# Patient Record
Sex: Female | Born: 1965
Health system: Southern US, Community
[De-identification: ages and names within clinical notes are randomized; demographics above are authoritative.]

## PROBLEM LIST (undated history)

## (undated) ENCOUNTER — Inpatient Hospital Stay: Admission: EM | Payer: Self-pay | Source: Home / Self Care

## (undated) DIAGNOSIS — M1990 Unspecified osteoarthritis, unspecified site: Secondary | ICD-10-CM

## (undated) DIAGNOSIS — E119 Type 2 diabetes mellitus without complications: Secondary | ICD-10-CM

## (undated) DIAGNOSIS — F32A Depression, unspecified: Secondary | ICD-10-CM

## (undated) DIAGNOSIS — F329 Major depressive disorder, single episode, unspecified: Secondary | ICD-10-CM

## (undated) DIAGNOSIS — I10 Essential (primary) hypertension: Secondary | ICD-10-CM

## (undated) DIAGNOSIS — E041 Nontoxic single thyroid nodule: Secondary | ICD-10-CM

## (undated) DIAGNOSIS — N200 Calculus of kidney: Secondary | ICD-10-CM

## (undated) DIAGNOSIS — F419 Anxiety disorder, unspecified: Secondary | ICD-10-CM

## (undated) DIAGNOSIS — F909 Attention-deficit hyperactivity disorder, unspecified type: Secondary | ICD-10-CM

## (undated) HISTORY — PX: TUBAL LIGATION: SHX77

## (undated) HISTORY — DX: Unspecified osteoarthritis, unspecified site: M19.90

## (undated) HISTORY — PX: ENDOMETRIAL ABLATION: SHX621

## (undated) HISTORY — PX: APPENDECTOMY: SHX54

## (undated) HISTORY — DX: Nontoxic single thyroid nodule: E04.1

## (undated) HISTORY — PX: ECTOPIC PREGNANCY SURGERY: SHX613

## (undated) HISTORY — DX: Anxiety disorder, unspecified: F41.9

## (undated) HISTORY — PX: CHOLECYSTECTOMY: SHX55

---

## 2004-06-05 ENCOUNTER — Ambulatory Visit: Payer: Self-pay | Admitting: Obstetrics and Gynecology

## 2006-01-01 ENCOUNTER — Ambulatory Visit: Payer: Self-pay | Admitting: Family Medicine

## 2006-02-02 ENCOUNTER — Ambulatory Visit: Payer: Self-pay | Admitting: Gastroenterology

## 2006-03-06 ENCOUNTER — Ambulatory Visit: Payer: Self-pay | Admitting: Gastroenterology

## 2006-03-23 ENCOUNTER — Ambulatory Visit: Payer: Self-pay | Admitting: Gastroenterology

## 2007-05-07 ENCOUNTER — Ambulatory Visit: Payer: Self-pay | Admitting: Internal Medicine

## 2007-07-02 ENCOUNTER — Ambulatory Visit: Payer: Self-pay | Admitting: Family Medicine

## 2008-04-07 ENCOUNTER — Ambulatory Visit: Payer: Self-pay | Admitting: Family Medicine

## 2008-11-17 ENCOUNTER — Ambulatory Visit: Payer: Self-pay | Admitting: Family Medicine

## 2009-04-27 ENCOUNTER — Ambulatory Visit: Payer: Self-pay | Admitting: Internal Medicine

## 2012-01-07 ENCOUNTER — Ambulatory Visit: Payer: Self-pay | Admitting: Family Medicine

## 2012-01-07 LAB — CBC WITH DIFFERENTIAL/PLATELET
Basophil #: 0 x10 3/mm 3
Basophil %: 0.3 %
Eosinophil #: 0.4 x10 3/mm 3
Eosinophil %: 2.9 %
HCT: 40.9 %
HGB: 13.7 g/dL
Lymphocyte %: 31.6 %
Lymphs Abs: 4.3 x10 3/mm 3 — ABNORMAL HIGH
MCH: 29.5 pg
MCHC: 33.6 g/dL
MCV: 88 fL
Monocyte #: 0.8 "x10 3/mm "
Monocyte %: 6.2 %
Neutrophil #: 8 x10 3/mm 3 — ABNORMAL HIGH
Neutrophil %: 59 %
Platelet: 392 x10 3/mm 3
RBC: 4.66 X10 6/mm 3
RDW: 13.1 %
WBC: 13.5 x10 3/mm 3 — ABNORMAL HIGH

## 2012-01-07 LAB — MONONUCLEOSIS SCREEN: Mono Test: NEGATIVE

## 2012-03-11 ENCOUNTER — Emergency Department: Payer: Self-pay | Admitting: Emergency Medicine

## 2012-04-06 ENCOUNTER — Ambulatory Visit: Payer: Self-pay | Admitting: Family Medicine

## 2012-04-12 ENCOUNTER — Ambulatory Visit: Payer: Self-pay | Admitting: Family Medicine

## 2013-01-17 ENCOUNTER — Ambulatory Visit: Payer: Self-pay

## 2013-01-17 ENCOUNTER — Emergency Department: Payer: Self-pay | Admitting: Emergency Medicine

## 2013-01-17 LAB — CBC WITH DIFFERENTIAL/PLATELET
Basophil #: 0.2 10*3/uL — ABNORMAL HIGH (ref 0.0–0.1)
Eosinophil #: 0.3 10*3/uL (ref 0.0–0.7)
Eosinophil %: 1.2 %
HCT: 52.3 % — ABNORMAL HIGH (ref 35.0–47.0)
Lymphocyte #: 4.4 10*3/uL — ABNORMAL HIGH (ref 1.0–3.6)
MCH: 28.4 pg (ref 26.0–34.0)
MCHC: 32.4 g/dL (ref 32.0–36.0)
MCV: 88 fL (ref 80–100)
Monocyte #: 0.7 x10 3/mm (ref 0.2–0.9)
Monocyte %: 3.1 %
Platelet: 535 10*3/uL — ABNORMAL HIGH (ref 150–440)
RBC: 5.96 10*6/uL — ABNORMAL HIGH (ref 3.80–5.20)
WBC: 22.7 10*3/uL — ABNORMAL HIGH (ref 3.6–11.0)

## 2013-01-17 LAB — COMPREHENSIVE METABOLIC PANEL
Albumin: 4.1 g/dL (ref 3.4–5.0)
Anion Gap: 16 (ref 7–16)
BUN: 10 mg/dL (ref 7–18)
Bilirubin,Total: 0.4 mg/dL (ref 0.2–1.0)
Calcium, Total: 10.3 mg/dL — ABNORMAL HIGH (ref 8.5–10.1)
Creatinine: 0.91 mg/dL (ref 0.60–1.30)
EGFR (African American): >60
Glucose: 141 mg/dL — ABNORMAL HIGH (ref 65–99)
Osmolality: 277 (ref 275–301)
SGOT(AST): 16 U/L (ref 15–37)
Sodium: 138 mmol/L (ref 136–145)
Total Protein: 8 g/dL (ref 6.4–8.2)

## 2013-01-17 LAB — URINALYSIS, COMPLETE
Glucose,UR: >=500 mg/dL (ref 0–75)
Ph: 5 (ref 4.5–8.0)
Specific Gravity: 1.02 (ref 1.003–1.030)
Squamous Epithelial: 4
WBC UR: 4 /HPF (ref 0–5)

## 2013-01-17 LAB — PREGNANCY, URINE: Pregnancy Test, Urine: NEGATIVE m[IU]/mL

## 2013-01-19 LAB — BETA STREP CULTURE(ARMC)

## 2013-01-21 DIAGNOSIS — N200 Calculus of kidney: Secondary | ICD-10-CM | POA: Insufficient documentation

## 2013-05-16 ENCOUNTER — Ambulatory Visit: Payer: Self-pay | Admitting: Physician Assistant

## 2013-05-16 LAB — URINALYSIS, COMPLETE
Bilirubin,UR: NEGATIVE
Glucose,UR: 1000 mg/dL (ref 0–75)
Ketone: NEGATIVE
Leukocyte Esterase: NEGATIVE
Nitrite: NEGATIVE
Ph: 6 (ref 4.5–8.0)
Protein: NEGATIVE
SPECIFIC GRAVITY: 1.01 (ref 1.003–1.030)

## 2013-06-07 ENCOUNTER — Ambulatory Visit: Payer: Self-pay | Admitting: Urology

## 2013-06-08 DIAGNOSIS — N201 Calculus of ureter: Secondary | ICD-10-CM | POA: Insufficient documentation

## 2013-06-16 ENCOUNTER — Ambulatory Visit: Payer: Self-pay | Admitting: Urology

## 2013-06-16 LAB — BASIC METABOLIC PANEL
Anion Gap: 4 — ABNORMAL LOW (ref 7–16)
BUN: 8 mg/dL (ref 7–18)
CALCIUM: 8.6 mg/dL (ref 8.5–10.1)
CHLORIDE: 107 mmol/L (ref 98–107)
CO2: 26 mmol/L (ref 21–32)
Creatinine: 0.71 mg/dL (ref 0.60–1.30)
EGFR (African American): >60
GLUCOSE: 145 mg/dL — AB (ref 65–99)
Osmolality: 275 (ref 275–301)
POTASSIUM: 3.8 mmol/L (ref 3.5–5.1)
Sodium: 137 mmol/L (ref 136–145)

## 2013-06-19 LAB — URINE CULTURE

## 2013-06-22 ENCOUNTER — Ambulatory Visit: Payer: Self-pay | Admitting: Urology

## 2013-08-02 ENCOUNTER — Ambulatory Visit: Payer: Self-pay | Admitting: Family Medicine

## 2013-08-02 LAB — RAPID STREP-A WITH REFLX: Micro Text Report: POSITIVE

## 2013-08-17 ENCOUNTER — Ambulatory Visit: Payer: Self-pay | Admitting: Family Medicine

## 2013-10-13 DIAGNOSIS — E538 Deficiency of other specified B group vitamins: Secondary | ICD-10-CM | POA: Insufficient documentation

## 2014-06-24 NOTE — Op Note (Signed)
PATIENT NAME:  Bonnie Garcia, RAHL MR#:  [id number] DATE OF BIRTH:  05/18/1965  DATE OF SURGERY:  06/22/2013  PREOPERATIVE DIAGNOSIS: Left distal ureteral calculus.   POSTOPERATIVE DIAGNOSIS: Passed ureteral calculus.  PROCEDURE:  Left ureteroscopy.  SURGEON:  John Giovanni, MD  ASSISTANT:  None.  ANESTHESIA: General.  INDICATION:  A 49 year old female who presented with significant irritative symptoms and sensation of incomplete emptying. She was found to have an approximately 5 mm left distal ureteral orifice. She elected ureteroscopic removal. She is not aware of passing the stone, and is still having symptoms.   DESCRIPTION:  She was taken to the cystoscopy suite, placed on the table in the supine position. A general anesthetic was administered via an LMA. She was then placed in the low lithotomy position, and her external genitalia were prepped and draped in the usual fashion. The urethra would not accept a 21 French cystoscope sheath obturator and it was subsequently dilated with Elby Showers sounds from 20 to 24 [place]. The cystoscope was then easily passed. Cystoscopy was performed, and the bladder mucosa was normal in appearance, without erythema solid or papillary lesions. The ureteral orifices were normal-appearing bilaterally with clear efflux. The stone was not seen on fluoroscopy. A hydrophilic  8.119 guidewire was placed through the ureteroscope and into the left ureteral orifice. The wire was passed up the renal pelvis under fluoroscopic guidance. The cystoscope was removed, and a 6 French semi-rigid ureteroscope was passed per urethra. The left ureteral orifice was easily engaged without dilation. The distal ureter was closely examined, and no stone was seen. The ureteroscope was passed up to the UPJ, and no stone was seen. The scope was slowly withdrawn. No abnormalities were noted. The ureteroscope and guidewire were removed. She was taken to PACU in stable condition.  There were no  complications. Estimated blood loss was zero.     ____________________________ Ronda Fairly Bernardo Heater, MD scs:mr D: 06/22/2013 16:34:21 ET T: 06/22/2013 19:17:05 ET JOB#: [id number]  cc: Nicki Reaper C. Bernardo Heater, MD, <Dictator> Abbie Sons MD ELECTRONICALLY SIGNED 06/29/2013 12:46

## 2014-11-21 ENCOUNTER — Ambulatory Visit
Admission: EM | Admit: 2014-11-21 | Discharge: 2014-11-21 | Disposition: A | Discharge status: Home / Self Care | Payer: BC Managed Care – PPO | Attending: Family Medicine | Admitting: Family Medicine

## 2014-11-21 DIAGNOSIS — H81399 Other peripheral vertigo, unspecified ear: Secondary | ICD-10-CM

## 2014-11-21 HISTORY — DX: Depression, unspecified: F32.A

## 2014-11-21 HISTORY — DX: Major depressive disorder, single episode, unspecified: F32.9

## 2014-11-21 HISTORY — DX: Attention-deficit hyperactivity disorder, unspecified type: F90.9

## 2014-11-21 HISTORY — DX: Type 2 diabetes mellitus without complications: E11.9

## 2014-11-21 LAB — GLUCOSE, CAPILLARY: GLUCOSE-CAPILLARY: 166 mg/dL — AB (ref 65–99)

## 2014-11-21 MED ORDER — ONDANSETRON 8 MG PO TBDP: 8.0000 mg | ORAL_TABLET | Freq: Two times a day (BID) | ORAL | Status: DC

## 2014-11-21 MED ORDER — ONDANSETRON 8 MG PO TBDP
8.0000 mg | ORAL_TABLET | Freq: Once | ORAL | Status: AC
  Administered 2014-11-21: 8 mg via ORAL

## 2014-11-21 MED ORDER — DIAZEPAM 2 MG PO TABS: 2.0000 mg | ORAL_TABLET | Freq: Three times a day (TID) | ORAL | Status: DC

## 2014-11-21 MED ORDER — MECLIZINE HCL 25 MG PO TABS
25.0000 mg | ORAL_TABLET | Freq: Once | ORAL | Status: AC
  Administered 2014-11-21: 25 mg via ORAL

## 2014-11-21 MED ORDER — ACETAMINOPHEN 500 MG PO TABS
1000.0000 mg | ORAL_TABLET | Freq: Once | ORAL | Status: AC
  Administered 2014-11-21: 1000 mg via ORAL

## 2014-11-21 MED ORDER — MECLIZINE HCL 25 MG PO TABS: 25.0000 mg | ORAL_TABLET | Freq: Three times a day (TID) | ORAL | Status: DC | PRN

## 2014-11-21 NOTE — ED Notes (Signed)
Pt states "I go up to go to the bathroom around 1 am and everything started spinning, even the bed was spinning. I am nauseated."

## 2014-11-21 NOTE — ED Notes (Signed)
Pt states she does have a headache that started at 9:30. Her blood sugar was 136 fasting this am. She did eat breakfast.

## 2014-11-21 NOTE — Discharge Instructions (Signed)

## 2014-11-21 NOTE — ED Provider Notes (Signed)
CSN: [id number]     Arrival date & time 11/21/14  1024 History   First MD Initiated Contact with Patient 11/21/14 1104     Chief Complaint  Patient presents with  . Dizziness   (Consider location/radiation/quality/duration/timing/severity/associated sxs/prior Treatment) HPI   A 49 year old female who states that she got up to go to the bathroom around 1 AM this morning  And had the sudden onset of everything spinning around her; she was nauseated but has not vomited. The dizziness was exacerbated with positional changes. She states that recumbency is her most comfortable position. Has had no diplopia or neurological losses, denies any weakness or clumsiness and is able to ambulate with small steps to avoid falling. She does have a headache (like she has had in the past). She's never had this dizziness before.Comorbidities include diabetes mellitus,hypertension and cholesterolemia.  Past Medical History  Diagnosis Date  . Diabetes mellitus without complication   . Depression   . ADHD (attention deficit hyperactivity disorder)    Past Surgical History  Procedure Laterality Date  . Appendectomy    . Cholecystectomy    . Tubal ligation    . Ectopic pregnancy surgery     No family history on file. Social History  Substance Use Topics  . Smoking status: Never Smoker   . Smokeless tobacco: None  . Alcohol Use: No   OB History    No data available     Review of Systems  Constitutional: Positive for activity change.  HENT: Negative for ear discharge and ear pain.   Eyes: Negative for photophobia and visual disturbance.  Neurological: Positive for dizziness. Negative for tremors, seizures, syncope, speech difficulty, weakness and numbness.  All other systems reviewed and are negative.   Allergies  Penicillins  Home Medications   Prior to Admission medications   Medication Sig Start Date End Date Taking? Authorizing Provider  Dapagliflozin-Metformin HCl ER (XIGDUO XR) 07-998  MG TB24 Take by mouth 2 (two) times daily.   Yes Historical Provider, MD  Dulaglutide (TRULICITY) 1.5 HY/8.6VH SOPN Inject into the skin.   Yes Historical Provider, MD  DULoxetine (CYMBALTA) 60 MG capsule Take 60 mg by mouth daily.   Yes Historical Provider, MD  famotidine (PEPCID) 20 MG tablet Take 20 mg by mouth 2 (two) times daily.   Yes Historical Provider, MD  rosuvastatin (CRESTOR) 10 MG tablet Take 10 mg by mouth daily.   Yes Historical Provider, MD  spironolactone (ALDACTONE) 50 MG tablet Take 75 mg by mouth 2 (two) times daily.   Yes Historical Provider, MD  diazepam (VALIUM) 2 MG tablet Take 1 tablet (2 mg total) by mouth 3 (three) times daily. 11/21/14   Lorin Picket, PA-C  meclizine (ANTIVERT) 25 MG tablet Take 1 tablet (25 mg total) by mouth 3 (three) times daily as needed for dizziness. 11/21/14   Lorin Picket, PA-C  ondansetron (ZOFRAN ODT) 8 MG disintegrating tablet Take 1 tablet (8 mg total) by mouth 2 (two) times daily. 11/21/14   Lorin Picket, PA-C   Meds Ordered and Administered this Visit   Medications  meclizine (ANTIVERT) tablet 25 mg (25 mg Oral Given 11/21/14 1143)    BP 136/89 mmHg  Pulse 74  Temp(Src) 98.2 F (36.8 C) (Oral)  Resp 16  Ht 5\' 10"  (1.778 m)  Wt 250 lb (113.399 kg)  BMI 35.87 kg/m2  SpO2 100%  LMP 11/07/2014 (Approximate) No data found.   Physical Exam  Constitutional: She is oriented to person, place,  and time. She appears well-developed and well-nourished. No distress.  HENT:  Head: Normocephalic and atraumatic.  Right Ear: External ear normal.  Left Ear: External ear normal.  Nose: Nose normal.  Mouth/Throat: Oropharynx is clear and moist.  Eyes: Pupils are equal, round, and reactive to light. Left eye exhibits no discharge. No scleral icterus.  Report reactive to light and accommodation. She has lateral nystagmus noticeable to the left. There are no rotary or horizontalt nystagmus seen.  Neck: Normal range of motion. Neck  supple.  Musculoskeletal: Normal range of motion. She exhibits no edema or tenderness.  Neurological: She is alert and oriented to person, place, and time. No cranial nerve deficit. She exhibits normal muscle tone.  Cranial nerves are intact. Rapid alternating hand motion is normal. Romberg shows her to  veer slightly to the left. She has normal heel-to-shin bilaterally. Is able to ambulate unassisted without wavering. She has no sensation abnormalities and no motor loss.  Skin: Skin is warm and dry. She is not diaphoretic.  Psychiatric: She has a normal mood and affect. Her behavior is normal. Judgment and thought content normal.  Nursing note and vitals reviewed.   ED Course  Procedures (including critical care time)  Labs Review Labs Reviewed  GLUCOSE, CAPILLARY - Abnormal; Notable for the following:    Glucose-Capillary 166 (*)    All other components within normal limits  CBG MONITORING, ED    Imaging Review No results found.   Visual Acuity Review  Right Eye Distance:   Left Eye Distance:   Bilateral Distance:    Right Eye Near:   Left Eye Near:    Bilateral Near:     Medications  meclizine (ANTIVERT) tablet 25 mg (25 mg Oral Given 11/21/14 1143)      MDM   1. Peripheral positional vertigo, unspecified laterality    New Prescriptions   DIAZEPAM (VALIUM) 2 MG TABLET    Take 1 tablet (2 mg total) by mouth 3 (three) times daily.   MECLIZINE (ANTIVERT) 25 MG TABLET    Take 1 tablet (25 mg total) by mouth 3 (three) times daily as needed for dizziness.   ONDANSETRON (ZOFRAN ODT) 8 MG DISINTEGRATING TABLET    Take 1 tablet (8 mg total) by mouth 2 (two) times daily.   Plan: 1. Diagnosis reviewed with patient 2. rx as per orders; risks, benefits, potential side effects reviewed with patient 3. Recommend supportive treatment with walker for safety. 4. F/u with [facility] tomorrow. Or with her PCP  Lorin Picket, PA-C 11/21/14 1208

## 2015-04-23 ENCOUNTER — Other Ambulatory Visit: Payer: Self-pay

## 2015-06-06 ENCOUNTER — Other Ambulatory Visit: Payer: Self-pay | Admitting: Gastroenterology

## 2015-06-06 DIAGNOSIS — R1310 Dysphagia, unspecified: Secondary | ICD-10-CM

## 2015-06-12 ENCOUNTER — Ambulatory Visit
Admission: RE | Admit: 2015-06-12 | Discharge: 2015-06-12 | Disposition: A | Discharge status: Home / Self Care | Payer: BC Managed Care – PPO | Source: Ambulatory Visit | Attending: Gastroenterology | Admitting: Gastroenterology

## 2015-06-12 DIAGNOSIS — R1314 Dysphagia, pharyngoesophageal phase: Secondary | ICD-10-CM | POA: Insufficient documentation

## 2015-06-12 DIAGNOSIS — K222 Esophageal obstruction: Secondary | ICD-10-CM | POA: Diagnosis not present

## 2015-06-12 DIAGNOSIS — R1310 Dysphagia, unspecified: Secondary | ICD-10-CM

## 2015-06-12 DIAGNOSIS — K219 Gastro-esophageal reflux disease without esophagitis: Secondary | ICD-10-CM | POA: Diagnosis not present

## 2015-06-26 ENCOUNTER — Other Ambulatory Visit: Payer: Self-pay | Admitting: Otolaryngology

## 2015-06-26 DIAGNOSIS — R42 Dizziness and giddiness: Secondary | ICD-10-CM

## 2015-07-17 ENCOUNTER — Ambulatory Visit
Admission: RE | Admit: 2015-07-17 | Discharge: 2015-07-17 | Disposition: A | Discharge status: Home / Self Care | Payer: BC Managed Care – PPO | Source: Ambulatory Visit | Attending: Otolaryngology | Admitting: Otolaryngology

## 2015-07-17 DIAGNOSIS — R42 Dizziness and giddiness: Secondary | ICD-10-CM | POA: Diagnosis present

## 2015-07-17 MED ORDER — GADOBENATE DIMEGLUMINE 529 MG/ML IV SOLN
20.0000 mL | Freq: Once | INTRAVENOUS | Status: AC | PRN
  Administered 2015-07-17: 20 mL via INTRAVENOUS

## 2015-07-23 ENCOUNTER — Ambulatory Visit: Payer: BC Managed Care – PPO | Admitting: Anesthesiology

## 2015-07-23 ENCOUNTER — Encounter: Payer: Self-pay | Admitting: *Deleted

## 2015-07-23 ENCOUNTER — Ambulatory Visit
Admission: RE | Admit: 2015-07-23 | Discharge: 2015-07-23 | Disposition: A | Discharge status: Home / Self Care | Payer: BC Managed Care – PPO | Source: Ambulatory Visit | Attending: Gastroenterology | Admitting: Gastroenterology

## 2015-07-23 ENCOUNTER — Encounter
Admission: RE | Disposition: A | Discharge status: Home / Self Care | Payer: Self-pay | Source: Ambulatory Visit | Attending: Gastroenterology

## 2015-07-23 DIAGNOSIS — K210 Gastro-esophageal reflux disease with esophagitis: Secondary | ICD-10-CM | POA: Diagnosis not present

## 2015-07-23 DIAGNOSIS — K3189 Other diseases of stomach and duodenum: Secondary | ICD-10-CM | POA: Diagnosis not present

## 2015-07-23 DIAGNOSIS — E119 Type 2 diabetes mellitus without complications: Secondary | ICD-10-CM | POA: Insufficient documentation

## 2015-07-23 DIAGNOSIS — I10 Essential (primary) hypertension: Secondary | ICD-10-CM | POA: Diagnosis not present

## 2015-07-23 DIAGNOSIS — K2960 Other gastritis without bleeding: Secondary | ICD-10-CM | POA: Insufficient documentation

## 2015-07-23 DIAGNOSIS — R1030 Lower abdominal pain, unspecified: Secondary | ICD-10-CM | POA: Diagnosis present

## 2015-07-23 DIAGNOSIS — F909 Attention-deficit hyperactivity disorder, unspecified type: Secondary | ICD-10-CM | POA: Insufficient documentation

## 2015-07-23 DIAGNOSIS — Z8371 Family history of colonic polyps: Secondary | ICD-10-CM | POA: Insufficient documentation

## 2015-07-23 DIAGNOSIS — R1013 Epigastric pain: Secondary | ICD-10-CM | POA: Insufficient documentation

## 2015-07-23 DIAGNOSIS — K5730 Diverticulosis of large intestine without perforation or abscess without bleeding: Secondary | ICD-10-CM | POA: Insufficient documentation

## 2015-07-23 DIAGNOSIS — F329 Major depressive disorder, single episode, unspecified: Secondary | ICD-10-CM | POA: Diagnosis not present

## 2015-07-23 DIAGNOSIS — Z87891 Personal history of nicotine dependence: Secondary | ICD-10-CM | POA: Diagnosis not present

## 2015-07-23 HISTORY — PX: COLONOSCOPY WITH PROPOFOL: SHX5780

## 2015-07-23 HISTORY — DX: Calculus of kidney: N20.0

## 2015-07-23 HISTORY — DX: Essential (primary) hypertension: I10

## 2015-07-23 HISTORY — PX: ESOPHAGOGASTRODUODENOSCOPY (EGD) WITH PROPOFOL: SHX5813

## 2015-07-23 LAB — GLUCOSE, CAPILLARY: Glucose-Capillary: 182 mg/dL — ABNORMAL HIGH (ref 65–99)

## 2015-07-23 SURGERY — COLONOSCOPY WITH PROPOFOL
Anesthesia: General

## 2015-07-23 MED ORDER — PROPOFOL 10 MG/ML IV BOLUS
INTRAVENOUS | Status: DC | PRN
  Administered 2015-07-23 (×2): 20 mg via INTRAVENOUS
  Administered 2015-07-23: 80 mg via INTRAVENOUS
  Administered 2015-07-23 (×2): 20 mg via INTRAVENOUS

## 2015-07-23 MED ORDER — SODIUM CHLORIDE 0.9 % IV SOLN
INTRAVENOUS | Status: DC
Start: 2015-07-23 — End: 2015-07-23

## 2015-07-23 MED ORDER — FENTANYL CITRATE (PF) 100 MCG/2ML IJ SOLN
INTRAMUSCULAR | Status: DC | PRN
  Administered 2015-07-23: 50 ug via INTRAVENOUS

## 2015-07-23 MED ORDER — GLYCOPYRROLATE 0.2 MG/ML IJ SOLN
INTRAMUSCULAR | Status: DC | PRN
  Administered 2015-07-23: 0.2 mg via INTRAVENOUS

## 2015-07-23 MED ORDER — MIDAZOLAM HCL 2 MG/2ML IJ SOLN
INTRAMUSCULAR | Status: DC | PRN
  Administered 2015-07-23: 1 mg via INTRAVENOUS

## 2015-07-23 MED ORDER — SODIUM CHLORIDE 0.9 % IV SOLN
INTRAVENOUS | Status: DC
Start: 2015-07-23 — End: 2015-07-23
  Administered 2015-07-23: 15:00:00 via INTRAVENOUS

## 2015-07-23 MED ORDER — PROPOFOL 500 MG/50ML IV EMUL
INTRAVENOUS | Status: DC | PRN
  Administered 2015-07-23: 140 ug/kg/min via INTRAVENOUS

## 2015-07-23 MED ORDER — LIDOCAINE HCL (CARDIAC) 20 MG/ML IV SOLN
INTRAVENOUS | Status: DC | PRN
  Administered 2015-07-23: 60 mg via INTRAVENOUS

## 2015-07-23 NOTE — H&P (Signed)
Outpatient short stay form Pre-procedure 07/23/2015 3:41 PM Lollie Sails MD  Primary Physician: Dr. Otilio Miu  Reason for visit:  EGD and colonoscopy  History of present illness:  Patient is a 50 year old female presenting today as above. She has complaint of some issues with swallowing. In the past she has had reflux related esophagitis and was not on a proton pump inhibitor until she was seen in the office. She also had a colonoscopy showing some hyperplastic polyps about 8 years ago. She has complaint of some nausea with some vomiting or smell foul smelling burps. She is diabetic. She has difficulty swallowing both liquids and solids with a sensation in the lower chest. She had a barium swallow showing a possible smooth narrowing in the lower esophagus. She reports some occasional bright red blood on the toilet paper and lower abdominal cramping. He is taking thick toes a as well as metformin and dapaglifazide.    Current facility-administered medications:  .  0.9 %  sodium chloride infusion, , Intravenous, Continuous, Lollie Sails, MD, Last Rate: 20 mL/hr at 07/23/15 1453 .  0.9 %  sodium chloride infusion, , Intravenous, Continuous, Lollie Sails, MD  Facility-Administered Medications Ordered in Other Encounters:  .  fentaNYL (SUBLIMAZE) injection, , Intravenous, Anesthesia Intra-op, Dionne Bucy, CRNA, 50 mcg at 07/23/15 1536 .  glycopyrrolate (ROBINUL) injection, , , Anesthesia Intra-op, Dionne Bucy, CRNA, 0.2 mg at 07/23/15 1532 .  midazolam (VERSED) injection, , , Anesthesia Intra-op, Dionne Bucy, CRNA, 1 mg at 07/23/15 1536  Prescriptions prior to admission  Medication Sig Dispense Refill Last Dose  . Dapagliflozin-Metformin HCl ER (XIGDUO XR) 07-998 MG TB24 Take by mouth 2 (two) times daily.   11/20/2014 at Unknown time  . diazepam (VALIUM) 2 MG tablet Take 1 tablet (2 mg total) by mouth 3 (three) times daily. 6 tablet 0   . Dulaglutide (TRULICITY) 1.5  0000000 SOPN Inject into the skin.   Past Week at Unknown time  . DULoxetine (CYMBALTA) 60 MG capsule Take 60 mg by mouth daily.   11/20/2014 at Unknown time  . famotidine (PEPCID) 20 MG tablet Take 20 mg by mouth 2 (two) times daily.   11/20/2014 at Unknown time  . meclizine (ANTIVERT) 25 MG tablet Take 1 tablet (25 mg total) by mouth 3 (three) times daily as needed for dizziness. 9 tablet 0   . ondansetron (ZOFRAN ODT) 8 MG disintegrating tablet Take 1 tablet (8 mg total) by mouth 2 (two) times daily. 6 tablet 0   . rosuvastatin (CRESTOR) 10 MG tablet Take 10 mg by mouth daily.   11/20/2014 at Unknown time  . spironolactone (ALDACTONE) 50 MG tablet Take 75 mg by mouth 2 (two) times daily.   11/20/2014 at Unknown time     Allergies  Allergen Reactions  . Erythromycin   . Penicillins Hives     Past Medical History  Diagnosis Date  . Diabetes mellitus without complication ([place])   . Depression   . ADHD (attention deficit hyperactivity disorder)   . Hypertension   . Kidney stones     Review of systems:      Physical Exam    Heart and lungs: Rate and rhythm without rub or gallop, lungs are bilaterally clear.    HEENT: Normocephalic atraumatic eyes are anicteric    Other:     Pertinant exam for procedure: Soft nontender nondistended bowel sounds positive normoactive.    Planned proceedures: EGD, colonoscopy and indicated procedures. I have discussed the  risks benefits and complications of procedures to include not limited to bleeding, infection, perforation and the risk of sedation and the patient wishes to proceed.    Lollie Sails, MD Gastroenterology 07/23/2015  3:41 PM

## 2015-07-23 NOTE — Anesthesia Preprocedure Evaluation (Signed)
Anesthesia Evaluation  Patient identified by MRN, date of birth, ID band Patient awake    Reviewed: Allergy & Precautions, H&P , NPO status , Patient's Chart, lab work & pertinent test results  History of Anesthesia Complications Negative for: history of anesthetic complications  Airway Mallampati: III  TM Distance: >3 FB Neck ROM: full    Dental  (+) Poor Dentition, Missing, Chipped, Partial Upper   Pulmonary neg pulmonary ROS, neg shortness of breath, former smoker,    Pulmonary exam normal breath sounds clear to auscultation       Cardiovascular Exercise Tolerance: Good hypertension, (-) angina(-) Past MI and (-) DOE Normal cardiovascular exam Rhythm:regular Rate:Normal     Neuro/Psych PSYCHIATRIC DISORDERS Depression negative neurological ROS     GI/Hepatic negative GI ROS, Neg liver ROS,   Endo/Other  diabetes, Type 2  Renal/GU Renal disease  negative genitourinary   Musculoskeletal   Abdominal   Peds  Hematology negative hematology ROS (+)   Anesthesia Other Findings Past Medical History:   Diabetes mellitus without complication ([facility])                 Depression                                                   ADHD (attention deficit hyperactivity disorder)              Hypertension                                                 Kidney stones                                               Past Surgical History:   APPENDECTOMY                                                  CHOLECYSTECTOMY                                               TUBAL LIGATION                                                ECTOPIC PREGNANCY SURGERY                                     ENDOMETRIAL ABLATION                                         BMI    Body  Mass Index   40.17 kg/m 2      Reproductive/Obstetrics negative OB ROS                             Anesthesia Physical Anesthesia Plan  ASA:  III  Anesthesia Plan: General   Post-op Pain Management:    Induction:   Airway Management Planned:   Additional Equipment:   Intra-op Plan:   Post-operative Plan:   Informed Consent: I have reviewed the patients History and Physical, chart, labs and discussed the procedure including the risks, benefits and alternatives for the proposed anesthesia with the patient or authorized representative who has indicated his/her understanding and acceptance.   Dental Advisory Given  Plan Discussed with: Anesthesiologist, CRNA and Surgeon  Anesthesia Plan Comments:         Anesthesia Quick Evaluation

## 2015-07-23 NOTE — Op Note (Signed)
[facility]astroenter[facility] Patient Name: Bonnie Garcia Procedure Date: 07/23/2015 3:34 PM MRN: [id number] Account #: [id number] Date of Birth: June 25, 1965 Admit Type: Outpatient Age: 50 Room: [facility] ENDO ROOM 3 Gender: Female Note Status: Finalized Procedure:            Upper GI endoscopy Indications:          Dyspepsia, Dysphagia Providers:            Lollie Sails, MD Referring MD:         Juline Patch, MD (Referring MD) Medicines:            Monitored Anesthesia Care Complications:        No immediate complications. Procedure:            Pre-Anesthesia Assessment:                       - ASA Grade Assessment: III - A patient with severe                        systemic disease.                       After obtaining informed consent, the endoscope was                        passed under direct vision. Throughout the procedure,                        the patient's blood pressure, pulse, and oxygen                        saturations were monitored continuously. The                        Colonoscope was introduced through the mouth, and                        advanced to the third part of duodenum. The upper GI                        endoscopy was accomplished without difficulty. The                        patient tolerated the procedure well. Findings:      LA Grade A (one or more mucosal breaks less than 5 mm, not extending       between tops of 2 mucosal folds) esophagitis with no bleeding was found.       Biopsies were taken with a cold forceps for histology.      The examined esophagus was normal. There is no evidence of fixed       stricture, mass or stenosis.      Diffuse moderate inflammation characterized by adherent blood,       congestion (edema) and erythema was found in the entire examined       stomach. Biopsies were taken with a cold forceps for histology.      The examined duodenum was normal.      of note there is avid passage of  insufflated air upward through a       patulous GE junction. Impression:           -  LA Grade A reflux esophagitis. Biopsied.                       - Normal esophagus.                       - Bile gastritis. Biopsied.                       - Normal examined duodenum. Recommendation:       - Await pathology results.                       - Use Protonix (pantoprazole) 40 mg PO BID daily.                       - Use sucralfate suspension 1 gram PO QID for 2 months. Procedure Code(s):    --- Professional ---                       [phone], Esophagogastroduodenoscopy, flexible, transoral;                        with biopsy, single or multiple Diagnosis Code(s):    --- Professional ---                       K21.0, Gastro-esophageal reflux disease with esophagitis                       K29.60, Other gastritis without bleeding                       R10.13, Epigastric pain                       R13.10, Dysphagia, unspecified CPT copyright 2016 American Medical Association. All rights reserved. The codes documented in this report are preliminary and upon coder review may  be revised to meet current compliance requirements. Lollie Sails, MD 07/23/2015 4:11:56 PM This report has been signed electronically. Number of Addenda: 0 Note Initiated On: 07/23/2015 3:34 PM      [facility]

## 2015-07-23 NOTE — Transfer of Care (Signed)
Immediate Anesthesia Transfer of Care Note  Patient: Bonnie Garcia  Procedure(s) Performed: Procedure(s): COLONOSCOPY WITH PROPOFOL (N/A) ESOPHAGOGASTRODUODENOSCOPY (EGD) WITH PROPOFOL (N/A)  Patient Location: PACU  Anesthesia Type:General  Level of Consciousness: awake and patient cooperative  Airway & Oxygen Therapy: Patient Spontanous Breathing and Patient connected to nasal cannula oxygen  Post-op Assessment: Report given to RN  Post vital signs: Reviewed and stable  Last Vitals:  Filed Vitals:   07/23/15 1434 07/23/15 1646  BP: 163/112 131/80  Pulse: 93 95  Temp: 36.2 C 36 C  Resp: 18 24    Last Pain:  Filed Vitals:   07/23/15 1647  PainSc: 2          Complications: No apparent anesthesia complications

## 2015-07-23 NOTE — Op Note (Signed)
[facility] Gastroenterology Patient Name: Bonnie Garcia Procedure Date: 07/23/2015 3:33 PM MRN: [id number] Account #: [id number] Date of Birth: 10-08-1965 Admit Type: Outpatient Age: 50 Room: [facility] ENDO ROOM 3 Gender: Female Note Status: Finalized Procedure:            Colonoscopy Indications:          Lower abdominal pain, Family history of colonic polyps                        in a first-degree relative Providers:            Lollie Sails, MD Referring MD:         Juline Patch, MD (Referring MD) Medicines:            Monitored Anesthesia Care Complications:        No immediate complications. Procedure:            Pre-Anesthesia Assessment:                       - ASA Grade Assessment: III - A patient with severe                        systemic disease.                       After obtaining informed consent, the colonoscope was                        passed under direct vision. Throughout the procedure,                        the patient's blood pressure, pulse, and oxygen                        saturations were monitored continuously. The                        Colonoscope was introduced through the anus and                        advanced to the the cecum, identified by appendiceal                        orifice and ileocecal valve. The patient tolerated the                        procedure well. The quality of the bowel preparation                        was fair. Findings:      A few medium-mouthed diverticula were found in the sigmoid colon and       distal descending colon.      The retroflexed view of the distal rectum and anal verge was normal and       showed no anal or rectal abnormalities.      The digital rectal exam was normal. Impression:           - Preparation of the colon was fair.                       - Diverticulosis in  the sigmoid colon and in the distal                        descending colon.                       - The distal  rectum and anal verge are normal on                        retroflexion view.                       - No specimens collected. Recommendation:       - Discharge patient to home.                       - Use Citrucel one tablespoon PO daily.                       - Return to GI clinic in 1 month. Procedure Code(s):    --- Professional ---                       [phone], Colonoscopy, flexible; diagnostic, including                        collection of specimen(s) by brushing or washing, when                        performed (separate procedure) Diagnosis Code(s):    --- Professional ---                       R10.30, Lower abdominal pain, unspecified                       Z83.71, Family history of colonic polyps                       K57.30, Diverticulosis of large intestine without                        perforation or abscess without bleeding CPT copyright 2016 American Medical Association. All rights reserved. The codes documented in this report are preliminary and upon coder review may  be revised to meet current compliance requirements. Lollie Sails, MD 07/23/2015 4:43:57 PM This report has been signed electronically. Number of Addenda: 0 Note Initiated On: 07/23/2015 3:33 PM Scope Withdrawal Time: 0 hours 4 minutes 27 seconds  Total Procedure Duration: 0 hours 25 minutes 39 seconds       [facility]

## 2015-07-23 NOTE — Anesthesia Postprocedure Evaluation (Signed)
Anesthesia Post Note  Patient: SHAMEEKA MCMICHAEL  Procedure(s) Performed: Procedure(s) (LRB): COLONOSCOPY WITH PROPOFOL (N/A) ESOPHAGOGASTRODUODENOSCOPY (EGD) WITH PROPOFOL (N/A)  Patient location during evaluation: Endoscopy Anesthesia Type: General Level of consciousness: awake and alert Pain management: pain level controlled Vital Signs Assessment: post-procedure vital signs reviewed and stable Respiratory status: spontaneous breathing, nonlabored ventilation, respiratory function stable and patient connected to nasal cannula oxygen Cardiovascular status: blood pressure returned to baseline and stable Postop Assessment: no signs of nausea or vomiting Anesthetic complications: no    Last Vitals:  Filed Vitals:   07/23/15 1706 07/23/15 1716  BP: 145/99 147/78  Pulse: 88 90  Temp:    Resp: 17 23    Last Pain:  Filed Vitals:   07/23/15 1720  PainSc: 2                  Precious Haws Piscitello

## 2015-07-24 ENCOUNTER — Encounter: Payer: Self-pay | Admitting: Gastroenterology

## 2015-07-25 LAB — SURGICAL PATHOLOGY

## 2015-08-03 ENCOUNTER — Encounter: Payer: Self-pay | Admitting: Emergency Medicine

## 2015-08-03 ENCOUNTER — Ambulatory Visit (INDEPENDENT_AMBULATORY_CARE_PROVIDER_SITE_OTHER): Payer: BC Managed Care – PPO | Admitting: Family Medicine

## 2015-08-03 ENCOUNTER — Emergency Department: Payer: BC Managed Care – PPO

## 2015-08-03 ENCOUNTER — Encounter: Payer: Self-pay | Admitting: Family Medicine

## 2015-08-03 ENCOUNTER — Observation Stay
Admission: EM | Admit: 2015-08-03 | Discharge: 2015-08-04 | Disposition: A | Discharge status: Home / Self Care | Payer: BC Managed Care – PPO | Attending: Internal Medicine | Admitting: Internal Medicine

## 2015-08-03 ENCOUNTER — Observation Stay: Payer: BC Managed Care – PPO

## 2015-08-03 VITALS — BP 140/94 | HR 98 | Ht 70.0 in | Wt 298.0 lb

## 2015-08-03 DIAGNOSIS — F329 Major depressive disorder, single episode, unspecified: Secondary | ICD-10-CM | POA: Diagnosis not present

## 2015-08-03 DIAGNOSIS — Z79899 Other long term (current) drug therapy: Secondary | ICD-10-CM | POA: Diagnosis not present

## 2015-08-03 DIAGNOSIS — Z881 Allergy status to other antibiotic agents status: Secondary | ICD-10-CM | POA: Insufficient documentation

## 2015-08-03 DIAGNOSIS — Z9049 Acquired absence of other specified parts of digestive tract: Secondary | ICD-10-CM | POA: Diagnosis not present

## 2015-08-03 DIAGNOSIS — E119 Type 2 diabetes mellitus without complications: Secondary | ICD-10-CM

## 2015-08-03 DIAGNOSIS — F909 Attention-deficit hyperactivity disorder, unspecified type: Secondary | ICD-10-CM | POA: Insufficient documentation

## 2015-08-03 DIAGNOSIS — I739 Peripheral vascular disease, unspecified: Secondary | ICD-10-CM | POA: Diagnosis not present

## 2015-08-03 DIAGNOSIS — R0600 Dyspnea, unspecified: Secondary | ICD-10-CM | POA: Diagnosis not present

## 2015-08-03 DIAGNOSIS — R0789 Other chest pain: Secondary | ICD-10-CM | POA: Diagnosis not present

## 2015-08-03 DIAGNOSIS — G4730 Sleep apnea, unspecified: Secondary | ICD-10-CM | POA: Diagnosis not present

## 2015-08-03 DIAGNOSIS — Z1239 Encounter for other screening for malignant neoplasm of breast: Secondary | ICD-10-CM

## 2015-08-03 DIAGNOSIS — K210 Gastro-esophageal reflux disease with esophagitis: Secondary | ICD-10-CM | POA: Insufficient documentation

## 2015-08-03 DIAGNOSIS — K760 Fatty (change of) liver, not elsewhere classified: Secondary | ICD-10-CM | POA: Diagnosis not present

## 2015-08-03 DIAGNOSIS — I10 Essential (primary) hypertension: Secondary | ICD-10-CM | POA: Diagnosis not present

## 2015-08-03 DIAGNOSIS — R928 Other abnormal and inconclusive findings on diagnostic imaging of breast: Secondary | ICD-10-CM | POA: Diagnosis not present

## 2015-08-03 DIAGNOSIS — R079 Chest pain, unspecified: Secondary | ICD-10-CM | POA: Diagnosis present

## 2015-08-03 DIAGNOSIS — Z87442 Personal history of urinary calculi: Secondary | ICD-10-CM | POA: Diagnosis not present

## 2015-08-03 DIAGNOSIS — Z87891 Personal history of nicotine dependence: Secondary | ICD-10-CM | POA: Diagnosis not present

## 2015-08-03 DIAGNOSIS — Z880 Allergy status to penicillin: Secondary | ICD-10-CM | POA: Diagnosis not present

## 2015-08-03 LAB — URINALYSIS COMPLETE WITH MICROSCOPIC (ARMC ONLY)
BILIRUBIN URINE: NEGATIVE
GLUCOSE, UA: 50 mg/dL — AB
Leukocytes, UA: NEGATIVE
NITRITE: NEGATIVE
Protein, ur: 30 mg/dL — AB
SPECIFIC GRAVITY, URINE: 1.021 (ref 1.005–1.030)
pH: 5 (ref 5.0–8.0)

## 2015-08-03 LAB — CBC
HCT: 44.1 % (ref 35.0–47.0)
Hemoglobin: 14.8 g/dL (ref 12.0–16.0)
MCH: 30.6 pg (ref 26.0–34.0)
MCHC: 33.7 g/dL (ref 32.0–36.0)
MCV: 90.9 fL (ref 80.0–100.0)
PLATELETS: 387 10*3/uL (ref 150–440)
RBC: 4.85 MIL/uL (ref 3.80–5.20)
RDW: 13 % (ref 11.5–14.5)
WBC: 11.1 10*3/uL — AB (ref 3.6–11.0)

## 2015-08-03 LAB — GLUCOSE, CAPILLARY: GLUCOSE-CAPILLARY: 222 mg/dL — AB (ref 65–99)

## 2015-08-03 LAB — BASIC METABOLIC PANEL
Anion gap: 16 — ABNORMAL HIGH (ref 5–15)
BUN: 8 mg/dL (ref 6–20)
CHLORIDE: 99 mmol/L — AB (ref 101–111)
CO2: 19 mmol/L — ABNORMAL LOW (ref 22–32)
CREATININE: 0.74 mg/dL (ref 0.44–1.00)
Calcium: 9.3 mg/dL (ref 8.9–10.3)
Glucose, Bld: 290 mg/dL — ABNORMAL HIGH (ref 65–99)
Potassium: 3.7 mmol/L (ref 3.5–5.1)
Sodium: 134 mmol/L — ABNORMAL LOW (ref 135–145)

## 2015-08-03 LAB — TROPONIN I: Troponin I: <0.03 ng/mL (ref <0.031)

## 2015-08-03 MED ORDER — ASPIRIN 81 MG PO CHEW
324.0000 mg | CHEWABLE_TABLET | Freq: Once | ORAL | Status: AC
  Administered 2015-08-03: 324 mg via ORAL
  Filled 2015-08-03: qty 4

## 2015-08-03 MED ORDER — METOPROLOL TARTRATE 25 MG PO TABS
12.5000 mg | ORAL_TABLET | Freq: Two times a day (BID) | ORAL | Status: DC
  Administered 2015-08-03 – 2015-08-04 (×2): 12.5 mg via ORAL
  Filled 2015-08-03 (×2): qty 1

## 2015-08-03 MED ORDER — DULOXETINE HCL 60 MG PO CPEP
60.0000 mg | ORAL_CAPSULE | Freq: Every day | ORAL | Status: DC
  Administered 2015-08-03: 60 mg via ORAL
  Filled 2015-08-03 (×2): qty 1

## 2015-08-03 MED ORDER — ROSUVASTATIN CALCIUM 10 MG PO TABS
10.0000 mg | ORAL_TABLET | Freq: Every day | ORAL | Status: DC
  Administered 2015-08-03 – 2015-08-04 (×2): 10 mg via ORAL
  Filled 2015-08-03 (×2): qty 1

## 2015-08-03 MED ORDER — MECLIZINE HCL 25 MG PO TABS: 25.0000 mg | ORAL_TABLET | Freq: Three times a day (TID) | ORAL | Status: DC | PRN

## 2015-08-03 MED ORDER — SODIUM CHLORIDE 0.9% FLUSH
3.0000 mL | Freq: Two times a day (BID) | INTRAVENOUS | Status: DC
  Administered 2015-08-03: 3 mL via INTRAVENOUS

## 2015-08-03 MED ORDER — KETOROLAC TROMETHAMINE 30 MG/ML IJ SOLN
10.0000 mg | INTRAMUSCULAR | Status: AC
  Administered 2015-08-03: 9.9 mg via INTRAVENOUS
  Filled 2015-08-03: qty 1

## 2015-08-03 MED ORDER — ACETAMINOPHEN 500 MG PO TABS
1000.0000 mg | ORAL_TABLET | Freq: Once | ORAL | Status: AC
  Administered 2015-08-03: 1000 mg via ORAL
  Filled 2015-08-03: qty 2

## 2015-08-03 MED ORDER — PANTOPRAZOLE SODIUM 40 MG PO TBEC
40.0000 mg | DELAYED_RELEASE_TABLET | Freq: Two times a day (BID) | ORAL | Status: DC
  Administered 2015-08-04: 40 mg via ORAL
  Filled 2015-08-03: qty 1

## 2015-08-03 MED ORDER — SPIRONOLACTONE 25 MG PO TABS
75.0000 mg | ORAL_TABLET | Freq: Two times a day (BID) | ORAL | Status: DC
  Administered 2015-08-03 – 2015-08-04 (×2): 75 mg via ORAL
  Filled 2015-08-03 (×2): qty 3

## 2015-08-03 MED ORDER — ACETAMINOPHEN 650 MG RE SUPP: 650.0000 mg | Freq: Four times a day (QID) | RECTAL | Status: DC | PRN

## 2015-08-03 MED ORDER — DAPAGLIFLOZIN PRO-METFORMIN ER 5-1000 MG PO TB24: 1.0000 | ORAL_TABLET | Freq: Two times a day (BID) | ORAL | Status: DC

## 2015-08-03 MED ORDER — OXYCODONE HCL 5 MG PO TABS
5.0000 mg | ORAL_TABLET | ORAL | Status: DC | PRN
  Administered 2015-08-03 – 2015-08-04 (×5): 5 mg via ORAL
  Filled 2015-08-03 (×3): qty 1

## 2015-08-03 MED ORDER — OXYCODONE HCL 5 MG PO TABS
ORAL_TABLET | ORAL | Status: AC
  Administered 2015-08-03: 5 mg via ORAL
  Filled 2015-08-03: qty 1

## 2015-08-03 MED ORDER — ACETAMINOPHEN 325 MG PO TABS
650.0000 mg | ORAL_TABLET | Freq: Four times a day (QID) | ORAL | Status: DC | PRN
  Administered 2015-08-03: 650 mg via ORAL
  Filled 2015-08-03: qty 2

## 2015-08-03 MED ORDER — ONDANSETRON 8 MG PO TBDP
8.0000 mg | ORAL_TABLET | Freq: Three times a day (TID) | ORAL | Status: DC | PRN
  Administered 2015-08-04: 8 mg via ORAL
  Filled 2015-08-03 (×2): qty 1

## 2015-08-03 MED ORDER — INSULIN ASPART 100 UNIT/ML ~~LOC~~ SOLN
0.0000 [IU] | Freq: Three times a day (TID) | SUBCUTANEOUS | Status: DC
  Administered 2015-08-04: 5 [IU] via SUBCUTANEOUS
  Filled 2015-08-03: qty 3

## 2015-08-03 MED ORDER — INSULIN ASPART 100 UNIT/ML ~~LOC~~ SOLN
0.0000 [IU] | Freq: Every day | SUBCUTANEOUS | Status: DC
  Administered 2015-08-03: 2 [IU] via SUBCUTANEOUS
  Filled 2015-08-03: qty 2

## 2015-08-03 MED ORDER — SUCRALFATE 1 GM/10ML PO SUSP
1.0000 g | Freq: Three times a day (TID) | ORAL | Status: DC
  Administered 2015-08-03: 1 g via ORAL
  Filled 2015-08-03: qty 10

## 2015-08-03 MED ORDER — IOPAMIDOL (ISOVUE-370) INJECTION 76%
100.0000 mL | Freq: Once | INTRAVENOUS | Status: AC | PRN
  Administered 2015-08-03: 100 mL via INTRAVENOUS

## 2015-08-03 MED ORDER — NITROGLYCERIN 0.4 MG SL SUBL: 0.4000 mg | SUBLINGUAL_TABLET | SUBLINGUAL | Status: DC | PRN

## 2015-08-03 MED ORDER — ENOXAPARIN SODIUM 40 MG/0.4ML ~~LOC~~ SOLN
40.0000 mg | Freq: Two times a day (BID) | SUBCUTANEOUS | Status: DC
  Administered 2015-08-03 – 2015-08-04 (×2): 40 mg via SUBCUTANEOUS
  Filled 2015-08-03 (×2): qty 0.4

## 2015-08-03 MED ORDER — LORAZEPAM 1 MG PO TABS: 1.0000 mg | ORAL_TABLET | Freq: Two times a day (BID) | ORAL | Status: DC | PRN

## 2015-08-03 NOTE — ED Provider Notes (Signed)
[facility] Emergency Department Provider Note  ____________________________________________  Time seen: 7:10 PM  I have reviewed the triage vital signs and the nursing notes.   HISTORY  Chief Complaint Chest Pain    HPI Bonnie Garcia is a 50 y.o. female who complains of intermittent chest pain for the past 24 hours. It is squeezing in the center of the chest, nonradiating. He says he was shortness of breath and diaphoresis, no vomiting. Not exertional, not pleuritic. Has a history of hypertension diabetes and early heart disease in the family with her grandfather and father both having heart attacks in their 16s. Last had cardiac workup about 6 or 8 years ago at [facility]. No recent travel trauma hospitalization surgery or history of DVT or PE.     Past Medical History  Diagnosis Date  . Diabetes mellitus without complication ([place])   . Depression   . ADHD (attention deficit hyperactivity disorder)   . Hypertension   . Kidney stones      There are no active problems to display for this patient.    Past Surgical History  Procedure Laterality Date  . Appendectomy    . Cholecystectomy    . Tubal ligation    . Ectopic pregnancy surgery    . Endometrial ablation    . Colonoscopy with propofol N/A 07/23/2015    Procedure: COLONOSCOPY WITH PROPOFOL;  Surgeon: Lollie Sails, MD;  Location: [facility] ENDOSCOPY;  Service: Endoscopy;  Laterality: N/A;  . Esophagogastroduodenoscopy (egd) with propofol N/A 07/23/2015    Procedure: ESOPHAGOGASTRODUODENOSCOPY (EGD) WITH PROPOFOL;  Surgeon: Lollie Sails, MD;  Location: [facility] ENDOSCOPY;  Service: Endoscopy;  Laterality: N/A;     Current Outpatient Rx  Name  Route  Sig  Dispense  Refill  . Dapagliflozin-Metformin HCl ER (XIGDUO XR) 07-998 MG TB24   Oral   Take by mouth 2 (two) times daily. Reported on 08/03/2015         . Dulaglutide (TRULICITY) 1.5 0000000 SOPN   Subcutaneous   Inject into the  skin. Reported on 08/03/2015         . DULoxetine (CYMBALTA) 60 MG capsule   Oral   Take 60 mg by mouth daily. Reported on 08/03/2015         . Liraglutide 18 MG/3ML SOPN   Subcutaneous   Inject 1.8 mg into the skin daily. Dr Eddie Dibbles         . meclizine (ANTIVERT) 25 MG tablet   Oral   Take 1 tablet (25 mg total) by mouth 3 (three) times daily as needed for dizziness.   9 tablet   0   . ondansetron (ZOFRAN ODT) 8 MG disintegrating tablet   Oral   Take 1 tablet (8 mg total) by mouth 2 (two) times daily.   6 tablet   0   . pantoprazole (PROTONIX) 40 MG tablet   Oral   Take 1 tablet by mouth 2 (two) times daily.         . rosuvastatin (CRESTOR) 10 MG tablet   Oral   Take 10 mg by mouth daily. Reported on 08/03/2015         . spironolactone (ALDACTONE) 50 MG tablet   Oral   Take 75 mg by mouth 2 (two) times daily.         . sucralfate (CARAFATE) 1 GM/10ML suspension   Oral   Take by mouth.            Allergies Erythromycin  and Penicillins   History reviewed. No pertinent family history.  Social History Social History  Substance Use Topics  . Smoking status: Former Research scientist (life sciences)  . Smokeless tobacco: None  . Alcohol Use: 0.0 oz/week    0 Glasses of wine per week    Review of Systems  Constitutional:   No fever or chills.  Eyes:   No vision changes.  ENT:   No sore throat. No rhinorrhea. Cardiovascular:   Positive as above chest pain. Respiratory:   Positive shortness of breath. Gastrointestinal:   Negative for abdominal pain, vomiting and diarrhea.  Genitourinary:   Negative for dysuria or difficulty urinating. Musculoskeletal:   Negative for focal pain or swelling Neurological:   Negative for headaches 10-point ROS otherwise negative.  ____________________________________________   PHYSICAL EXAM:  VITAL SIGNS: ED Triage Vitals  Enc Vitals Group     BP 08/03/15 1543 157/83 mmHg     Pulse Rate 08/03/15 1543 99     Resp 08/03/15 1543 20     Temp  08/03/15 1543 99 F (37.2 C)     Temp Source 08/03/15 1543 Oral     SpO2 08/03/15 1543 96 %     Weight --      Height --      Head Cir --      Peak Flow --      Pain Score 08/03/15 1512 0     Pain Loc --      Pain Edu? --      Excl. in [place]? --     Vital signs reviewed, nursing assessments reviewed.   Constitutional:   Alert and oriented. Well appearing and in no distress. Eyes:   No scleral icterus. No conjunctival pallor. PERRL. EOMI.  No nystagmus. ENT   Head:   Normocephalic and atraumatic.   Nose:   No congestion/rhinnorhea. No septal hematoma   Mouth/Throat:   MMM, no pharyngeal erythema. No peritonsillar mass.    Neck:   No stridor. No SubQ emphysema. No meningismus. Hematological/Lymphatic/Immunilogical:   No cervical lymphadenopathy. Cardiovascular:   RRR. Symmetric bilateral radial and DP pulses.  No murmurs.  Respiratory:   Normal respiratory effort without tachypnea nor retractions. Breath sounds are clear and equal bilaterally. No wheezes/rales/rhonchi. Gastrointestinal:   Soft and nontender. Non distended. There is no CVA tenderness.  No rebound, rigidity, or guarding. Genitourinary:   deferred Musculoskeletal:   Nontender with normal range of motion in all extremities. No joint effusions.  No lower extremity tenderness.  No edema. Chest pain nonreproducible on exam Neurologic:   Normal speech and language.  CN 2-10 normal. Motor grossly intact. No gross focal neurologic deficits are appreciated.  Skin:    Skin is warm, dry and intact. No rash noted.  No petechiae, purpura, or bullae.  ____________________________________________    LABS (pertinent positives/negatives) (all labs ordered are listed, but only abnormal results are displayed) Labs Reviewed  BASIC METABOLIC PANEL - Abnormal; Notable for the following:    Sodium 134 (*)    Chloride 99 (*)    CO2 19 (*)    Glucose, Bld 290 (*)    Anion gap 16 (*)    All other components within normal  limits  CBC - Abnormal; Notable for the following:    WBC 11.1 (*)    All other components within normal limits  TROPONIN I   ____________________________________________   EKG  Interpreted by me Sinus tachycardia rate 105, normal axis and intervals. Poor R-wave progression in anterior precordium  leads. Normal ST segments and T waves.  ____________________________________________    RADIOLOGY  Chest x-ray unremarkable  ____________________________________________   PROCEDURES   ____________________________________________   INITIAL IMPRESSION / ASSESSMENT AND PLAN / ED COURSE  Pertinent labs & imaging results that were available during my care of the patient were reviewed by me and considered in my medical decision making (see chart for details).  Patient presents with chest pain that is not clearly anginal, but concerning enough for possible cardiac ischemia. Additionally, she has multiple risk factors including obesity, hypertension diabetes, and early CAD in her family. She has not had a recent cardiac workup. I'll give her aspirin, nitroglycerin. Tylenol and low-dose Toradol for headache. Discussed with hospitalist for further evaluation.     ____________________________________________   FINAL CLINICAL IMPRESSION(S) / ED DIAGNOSES  Final diagnoses:  Nonspecific chest pain       Portions of this note were generated with dragon dictation software. Dictation errors may occur despite best attempts at proofreading.   Carrie Mew, MD 08/03/15 1919

## 2015-08-03 NOTE — Addendum Note (Signed)
Addended by: Fredderick Severance on: 08/03/2015 02:53 PM   Modules accepted: Orders

## 2015-08-03 NOTE — ED Notes (Addendum)
Pt alert and oriented, in no acute distress at this time.

## 2015-08-03 NOTE — Progress Notes (Signed)
Name: Bonnie Garcia   MRN: [id number]    DOB: 1965/08/25   Date:08/03/2015       Progress Note  Subjective  Chief Complaint  Chief Complaint  Patient presents with  . Shortness of Breath    on exertion and at rest    HPI Comments: Patient presents with sxs of sleep apnea.  Witness apneic episdes/ loud snoring/ daytime somnolunce/ falls asleep in chair/ constant fatigue  Shortness of Breath This is a recurrent problem. The current episode started in the past 7 days. The problem occurs daily. The problem has been gradually worsening. Associated symptoms include chest pain, PND and wheezing. Pertinent negatives include no abdominal pain, claudication, coryza, ear pain, fever, headaches, leg pain, leg swelling, neck pain, orthopnea, rash, rhinorrhea, sore throat, sputum production, swollen glands, syncope or vomiting. The symptoms are aggravated by any activity. Associated symptoms comments: DOE/PND. The patient has no known risk factors for DVT/PE. The treatment provided no relief. There is no history of CAD, chronic lung disease, COPD or a heart failure.  Chest Pain  This is a new problem. The current episode started more than 1 month ago. The onset quality is gradual. The problem occurs intermittently. The pain is present in the substernal region (across chest). The pain is at a severity of 8/10. The pain is moderate. The quality of the pain is described as squeezing. The pain does not radiate. Associated symptoms include diaphoresis, dizziness, exertional chest pressure, nausea, palpitations, PND and shortness of breath. Pertinent negatives include no abdominal pain, back pain, claudication, cough, fever, headaches, leg pain, malaise/fatigue, orthopnea, sputum production, syncope or vomiting.  Pertinent negatives for past medical history include no CAD.    No problem-specific assessment & plan notes found for this encounter.   Past Medical History  Diagnosis Date  . Diabetes mellitus  without complication ([place])   . Depression   . ADHD (attention deficit hyperactivity disorder)   . Hypertension   . Kidney stones     Past Surgical History  Procedure Laterality Date  . Appendectomy    . Cholecystectomy    . Tubal ligation    . Ectopic pregnancy surgery    . Endometrial ablation    . Colonoscopy with propofol N/A 07/23/2015    Procedure: COLONOSCOPY WITH PROPOFOL;  Surgeon: Lollie Sails, MD;  Location: [facility] ENDOSCOPY;  Service: Endoscopy;  Laterality: N/A;  . Esophagogastroduodenoscopy (egd) with propofol N/A 07/23/2015    Procedure: ESOPHAGOGASTRODUODENOSCOPY (EGD) WITH PROPOFOL;  Surgeon: Lollie Sails, MD;  Location: [facility] ENDOSCOPY;  Service: Endoscopy;  Laterality: N/A;    History reviewed. No pertinent family history.  Social History   Social History  . Marital Status: Married    Spouse Name: N/A  . Number of Children: N/A  . Years of Education: N/A   Occupational History  . Not on file.   Social History Main Topics  . Smoking status: Former Research scientist (life sciences)  . Smokeless tobacco: Not on file  . Alcohol Use: 0.0 oz/week    0 Glasses of wine per week  . Drug Use: No  . Sexual Activity: Not on file   Other Topics Concern  . Not on file   Social History Narrative    Allergies  Allergen Reactions  . Erythromycin   . Penicillins Hives     Review of Systems  Constitutional: Positive for diaphoresis. Negative for fever, chills, weight loss and malaise/fatigue.  HENT: Negative for ear discharge, ear pain, rhinorrhea and sore throat.   Eyes:  Negative for blurred vision.  Respiratory: Positive for shortness of breath and wheezing. Negative for cough and sputum production.   Cardiovascular: Positive for chest pain, palpitations and PND. Negative for orthopnea, claudication, leg swelling and syncope.  Gastrointestinal: Positive for nausea. Negative for heartburn, vomiting, abdominal pain, diarrhea, constipation, blood in stool and melena.   Genitourinary: Negative for dysuria, urgency, frequency and hematuria.  Musculoskeletal: Negative for myalgias, back pain, joint pain and neck pain.  Skin: Negative for rash.  Neurological: Positive for dizziness. Negative for tingling, sensory change, focal weakness and headaches.  Endo/Heme/Allergies: Negative for environmental allergies and polydipsia. Does not bruise/bleed easily.  Psychiatric/Behavioral: Negative for depression and suicidal ideas. The patient is not nervous/anxious and does not have insomnia.      Objective  Filed Vitals:   08/03/15 1341  BP: 140/94  Pulse: 98  Height: 5\' 10"  (1.778 m)  Weight: 298 lb (135.172 kg)    Physical Exam  Constitutional: She is well-developed, well-nourished, and in no distress. No distress.  HENT:  Head: Normocephalic and atraumatic.  Right Ear: External ear normal.  Left Ear: External ear normal.  Nose: Nose normal.  Mouth/Throat: Oropharynx is clear and moist.  Eyes: Conjunctivae and EOM are normal. Pupils are equal, round, and reactive to light. Right eye exhibits no discharge. Left eye exhibits no discharge.  Neck: Normal range of motion. Neck supple. No JVD present. No thyromegaly present.  Cardiovascular: Normal rate, regular rhythm, normal heart sounds and intact distal pulses.  Exam reveals no gallop and no friction rub.   No murmur heard. Pulmonary/Chest: Effort normal and breath sounds normal.  Abdominal: Soft. Bowel sounds are normal. She exhibits no mass. There is no tenderness. There is no guarding.  Musculoskeletal: Normal range of motion. She exhibits no edema.  Lymphadenopathy:    She has no cervical adenopathy.  Neurological: She is alert. She has normal reflexes.  Skin: Skin is warm and dry. She is not diaphoretic.  Psychiatric: Mood and affect normal.  Nursing note and vitals reviewed.     Assessment & Plan  Problem List Items Addressed This Visit    None    Visit Diagnoses    Chest pain,  unspecified chest pain type    -  Primary    ? unstable angina    Dyspnea        ? PE    Essential hypertension        uncontrolled off meds    Type 2 diabetes mellitus without complication, without long-term current use of insulin (HCC)        uncontrolled off meds    Relevant Medications    Liraglutide 18 MG/3ML SOPN    Sleep apnea        Breast cancer screening          Referral to ER   Dr. Otilio Miu [facility]  08/03/2015

## 2015-08-03 NOTE — H&P (Signed)
[facility] [facility] [place] NAME: Bonnie Garcia    MR#:  [id number]  DATE OF BIRTH:  [birth year]  DATE OF ADMISSION:  08/03/2015  PRIMARY CARE PHYSICIAN: Otilio Miu, MD   REQUESTING/REFERRING PHYSICIAN: Brenton Grills  CHIEF COMPLAINT:   Chief Complaint  Patient presents with  . Chest Pain    HISTORY OF PRESENT ILLNESS:  Bonnie Garcia  is a 50 y.o. female with diabetes and hypertension. She presents with chest pain going on and off for the last month. Shortness of breath really bad over the past week. She saw her PMD today and was sent to the hospital. She the chest pain as a tightness in the chest that can last for about 45 seconds at a time. They can come and go. It can happen at any time. The chest pain moves throughout her center of the chest right chest and left chest. At times she does hear some wheezing with the shortness of breath. She noticed her legs getting more swollen recently. She does have some nausea. She has been treated for vertigo. Also her face is been very red. Recent endoscopy showing reflux esophagitis. Hospitalist services were contacted for further evaluation.  PAST MEDICAL HISTORY:   Past Medical History  Diagnosis Date  . Diabetes mellitus without complication ([place])   . Depression   . ADHD (attention deficit hyperactivity disorder)   . Hypertension   . Kidney stones     PAST SURGICAL HISTORY:   Past Surgical History  Procedure Laterality Date  . Appendectomy    . Cholecystectomy    . Tubal ligation    . Ectopic pregnancy surgery    . Endometrial ablation    . Colonoscopy with propofol N/A 07/23/2015    Procedure: COLONOSCOPY WITH PROPOFOL;  Surgeon: Lollie Sails, MD;  Location: [facility] ENDOSCOPY;  Service: Endoscopy;  Laterality: N/A;  . Esophagogastroduodenoscopy (egd) with propofol N/A 07/23/2015    Procedure: ESOPHAGOGASTRODUODENOSCOPY (EGD) WITH PROPOFOL;  Surgeon: Lollie Sails, MD;   Location: [facility] ENDOSCOPY;  Service: Endoscopy;  Laterality: N/A;    SOCIAL HISTORY:   Social History  Substance Use Topics  . Smoking status: Former Research scientist (life sciences)  . Smokeless tobacco: Not on file  . Alcohol Use: 0.0 oz/week    0 Glasses of wine per week    FAMILY HISTORY:  History reviewed. No pertinent family history.  DRUG ALLERGIES:   Allergies  Allergen Reactions  . Erythromycin Other (See Comments)    Reaction:  GI upset   . Penicillins Hives and Other (See Comments)    Has patient had a PCN reaction causing immediate rash, facial/tongue/throat swelling, SOB or lightheadedness with hypotension: No Has patient had a PCN reaction causing severe rash involving mucus membranes or skin necrosis: No Has patient had a PCN reaction that required hospitalization No Has patient had a PCN reaction occurring within the last 10 years: No If all of the above answers are "NO", then may proceed with Cephalosporin use.    REVIEW OF SYSTEMS:  CONSTITUTIONAL: No fever, fatigue or weakness. Positive for chills and sweats. Positive for weight gain 25 pounds over the last 3 months EYES: No blurred or double vision. Positive for blurred vision EARS, NOSE, AND THROAT: No tinnitus or ear pain. No sore throat RESPIRATORY: No cough, positive shortness of breath, occasional wheezing.  no hemoptysis.  CARDIOVASCULAR: Positive for chest pain, positive for swelling edema.  GASTROINTESTINAL: Positive for nausea. No vomiting, diarrhea or abdominal pain. No blood  in bowel movements GENITOURINARY: No dysuria, hematuria.  ENDOCRINE: No polyuria, nocturia,  HEMATOLOGY: No anemia, easy bruising or bleeding SKIN: No rash or lesion. Redness on the face MUSCULOSKELETAL: No joint pain or arthritis.   NEUROLOGIC: No tingling, numbness, weakness.  PSYCHIATRY: Positive for anxiety and depression.   MEDICATIONS AT HOME:   Prior to Admission medications   Medication Sig Start Date End Date Taking? Authorizing  Provider  acetaminophen (TYLENOL) 500 MG tablet Take 1,500 mg by mouth every 6 (six) hours as needed for mild pain, fever or headache.   Yes Historical Provider, MD  Dapagliflozin-Metformin HCl ER (XIGDUO XR) 07-998 MG TB24 Take 1 tablet by mouth 2 (two) times daily.    Yes Historical Provider, MD  DULoxetine (CYMBALTA) 60 MG capsule Take 60 mg by mouth at bedtime.    Yes Historical Provider, MD  Liraglutide (VICTOZA) 18 MG/3ML SOPN Inject 1.8 mg into the skin at bedtime.   Yes Historical Provider, MD  LORazepam (ATIVAN) 1 MG tablet Take 1 mg by mouth 2 (two) times daily as needed for anxiety.   Yes Historical Provider, MD  meclizine (ANTIVERT) 25 MG tablet Take 1 tablet (25 mg total) by mouth 3 (three) times daily as needed for dizziness. 11/21/14  Yes Lorin Picket, PA-C  ondansetron (ZOFRAN-ODT) 8 MG disintegrating tablet Take 8 mg by mouth every 8 (eight) hours as needed for nausea or vomiting.   Yes Historical Provider, MD  pantoprazole (PROTONIX) 40 MG tablet Take 40 mg by mouth 2 (two) times daily before a meal.    Yes Historical Provider, MD  rosuvastatin (CRESTOR) 10 MG tablet Take 10 mg by mouth daily.    Yes Historical Provider, MD  spironolactone (ALDACTONE) 50 MG tablet Take 75 mg by mouth 2 (two) times daily.   Yes Historical Provider, MD  sucralfate (CARAFATE) 1 GM/10ML suspension Take 1 g by mouth 4 (four) times daily -  with meals and at bedtime.    Yes Historical Provider, MD      VITAL SIGNS:  Blood pressure 173/92, pulse 98, temperature 99 F (37.2 C), temperature source Oral, resp. rate 16, last menstrual period 08/03/2015, SpO2 98 %.  PHYSICAL EXAMINATION:  GENERAL:  50 y.o.-year-old patient lying in the bed with no acute distress.  EYES: Pupils equal, round, reactive to light and accommodation. No scleral icterus. Extraocular muscles intact.  HEENT: Head atraumatic, normocephalic. Oropharynx and nasopharynx clear.  NECK:  Supple, no jugular venous distention. No  thyroid enlargement, no tenderness.  LUNGS: Decreased breath sounds bilaterally, no wheezing, rales,rhonchi or crepitation. No use of accessory muscles of respiration.  CARDIOVASCULAR: S1, S2 normal. No murmurs, rubs, or gallops.  ABDOMEN: Soft, nontender, nondistended. Bowel sounds present. No organomegaly or mass.  EXTREMITIES: Trace pedal edema, no cyanosis, or clubbing.  NEUROLOGIC: Cranial nerves II through XII are intact. Muscle strength 5/5 in all extremities. Sensation intact. Gait not checked.  PSYCHIATRIC: The patient is alert and oriented x 3.  SKIN: No rash, lesion, or ulcer.   LABORATORY PANEL:   CBC  Recent Labs Lab 08/03/15 1535  WBC 11.1*  HGB 14.8  HCT 44.1  PLT 387   ------------------------------------------------------------------------------------------------------------------  Chemistries   Recent Labs Lab 08/03/15 1535  NA 134*  K 3.7  CL 99*  CO2 19*  GLUCOSE 290*  BUN 8  CREATININE 0.74  CALCIUM 9.3   ------------------------------------------------------------------------------------------------------------------  Cardiac Enzymes  Recent Labs Lab 08/03/15 1535  TROPONINI <0.03   ------------------------------------------------------------------------------------------------------------------  RADIOLOGY:  Dg Chest 2  View  08/03/2015  CLINICAL DATA:  Shortness of breath, chest pain EXAM: CHEST  2 VIEW COMPARISON:  None. FINDINGS: Lungs are clear.  No pleural effusion or pneumothorax. The heart is normal in size. Visualized osseous structures are within normal limits. IMPRESSION: Normal chest radiographs. Electronically Signed   By: Julian Hy M.D.   On: 08/03/2015 16:10    EKG:   Sinus tachycardia 105 bpm left atrial enlargement. Low voltage, Q waves septally  IMPRESSION AND PLAN:   1. Chest pain and shortness of breath. I will get a CT scan of the chest to rule out pulmonary embolism. If that is negative I can get a stress test  tomorrow morning. Patient received 1 dose of aspirin in the emergency room. With the patient's reflux esophagitis I will hold off on further doses of aspirin Lester heart enzymes turn positive. 2. Accelerated hypertension. Start low-dose metoprolol 3. Type 2 diabetes mellitus. Hold her diabetic medications start low-dose Lantus and sliding scale 4. Morbid obesity weight loss needed 5  low-grade temperature. Send urinalysis     All the records are reviewed and case discussed with ED provider. Management plans discussed with the patient, family and they are in agreement.  CODE STATUS: Full code  TOTAL TIME TAKING CARE OF THIS PATIENT: 50 minutes.    Loletha Grayer M.D on 08/03/2015 at 8:12 PM  Between 7am to 6pm - Pager - [phone]  After 6pm call admission pager [phone]  [employer] Office  [phone]  CC: Primary care physician; Otilio Miu, MD

## 2015-08-03 NOTE — ED Notes (Signed)
Pt to ed from Dr's office with c/o chest pain, lethargy, constant fatigue and weakness increasing over the past week.  Pt denies fever, denies headache.  EKG done on arrival to ed.

## 2015-08-03 NOTE — Progress Notes (Signed)
Anticoagulation monitoring(Lovenox):  50 yo  ordered Lovenox 40 mg Q24h  There were no vitals filed for this visit. BMI 40.3  Lab Results  Component Value Date   CREATININE 0.74 08/03/2015   CREATININE 0.71 06/16/2013   CREATININE 0.91 01/17/2013   Estimated Creatinine Clearance: 129.2 mL/min (by C-G formula based on Cr of 0.74). Hemoglobin & Hematocrit     Component Value Date/Time   HGB 14.8 08/03/2015 1535   HGB 16.9* 01/17/2013 1411   HCT 44.1 08/03/2015 1535   HCT 52.3* 01/17/2013 1411     Per Protocol for Patient with estCrcl> 30 ml/min and BMI > 40, will transition to Lovenox 40 mg Q12h.

## 2015-08-03 NOTE — ED Notes (Signed)
Attending MD at bedside.

## 2015-08-04 ENCOUNTER — Observation Stay: Payer: BC Managed Care – PPO

## 2015-08-04 ENCOUNTER — Encounter: Payer: Self-pay | Admitting: Radiology

## 2015-08-04 DIAGNOSIS — R0789 Other chest pain: Secondary | ICD-10-CM | POA: Diagnosis not present

## 2015-08-04 LAB — TROPONIN I

## 2015-08-04 LAB — LIPID PANEL
Cholesterol: 255 mg/dL — ABNORMAL HIGH (ref 0–200)
HDL: 40 mg/dL — AB (ref >40)
LDL Cholesterol: UNDETERMINED mg/dL (ref 0–99)
Total CHOL/HDL Ratio: 6.4 RATIO
Triglycerides: 555 mg/dL — ABNORMAL HIGH (ref <150)
VLDL: UNDETERMINED mg/dL (ref 0–40)

## 2015-08-04 LAB — CBC
HCT: 41 % (ref 35.0–47.0)
HEMOGLOBIN: 14.2 g/dL (ref 12.0–16.0)
MCH: 30.9 pg (ref 26.0–34.0)
MCHC: 34.5 g/dL (ref 32.0–36.0)
MCV: 89.6 fL (ref 80.0–100.0)
PLATELETS: 357 10*3/uL (ref 150–440)
RBC: 4.58 MIL/uL (ref 3.80–5.20)
RDW: 13.1 % (ref 11.5–14.5)
WBC: 10.2 10*3/uL (ref 3.6–11.0)

## 2015-08-04 LAB — TSH: TSH: 2.952 u[IU]/mL (ref 0.350–4.500)

## 2015-08-04 LAB — BASIC METABOLIC PANEL
Anion gap: 11 (ref 5–15)
BUN: 10 mg/dL (ref 6–20)
CALCIUM: 8.8 mg/dL — AB (ref 8.9–10.3)
CHLORIDE: 100 mmol/L — AB (ref 101–111)
CO2: 23 mmol/L (ref 22–32)
CREATININE: 0.79 mg/dL (ref 0.44–1.00)
GFR calc Af Amer: >60 mL/min (ref >60)
GFR calc non Af Amer: >60 mL/min (ref >60)
GLUCOSE: 266 mg/dL — AB (ref 65–99)
Potassium: 4 mmol/L (ref 3.5–5.1)
Sodium: 134 mmol/L — ABNORMAL LOW (ref 135–145)

## 2015-08-04 LAB — GLUCOSE, CAPILLARY
GLUCOSE-CAPILLARY: 230 mg/dL — AB (ref 65–99)
Glucose-Capillary: 231 mg/dL — ABNORMAL HIGH (ref 65–99)

## 2015-08-04 LAB — T4, FREE: Free T4: 0.81 ng/dL (ref 0.61–1.12)

## 2015-08-04 MED ORDER — TECHNETIUM TC 99M TETROFOSMIN IV KIT
31.8450 | PACK | Freq: Once | INTRAVENOUS | Status: AC | PRN
  Administered 2015-08-04: 31.845 via INTRAVENOUS

## 2015-08-04 MED ORDER — KETOROLAC TROMETHAMINE 30 MG/ML IJ SOLN
30.0000 mg | Freq: Once | INTRAMUSCULAR | Status: DC
  Filled 2015-08-04: qty 1

## 2015-08-04 MED ORDER — MORPHINE SULFATE (PF) 2 MG/ML IV SOLN: 2.0000 mg | Freq: Once | INTRAVENOUS | Status: DC

## 2015-08-04 MED ORDER — REGADENOSON 0.4 MG/5ML IV SOLN
0.4000 mg | Freq: Once | INTRAVENOUS | Status: DC
  Filled 2015-08-04: qty 5

## 2015-08-04 MED ORDER — TECHNETIUM TC 99M TETROFOSMIN IV KIT
12.6680 | PACK | Freq: Once | INTRAVENOUS | Status: AC | PRN
  Administered 2015-08-04: 12.668 via INTRAVENOUS

## 2015-08-04 NOTE — Progress Notes (Signed)
Patient is admitted to room 254 with the diagnosis of chest pain.  Patient is alert and oriented x 4. Patient was oriented  to the room, call bell/ascom and staff. Tele box  verified by the RN and Gelene Mink. RN. Skin assessment done with Gelene Mink., no skin issues of concern noted. Fall Neurosurgeon. Patient stated to the RN the that she takes  Victoza at home and it was not scheduled. Dr. Marcille Blanco notified with a new order for sliding scale. CBG=222, patient received 2 units of novolog with no complaint. Will continue to monitor.

## 2015-08-04 NOTE — Discharge Instructions (Signed)
°  DIET:  Low fat, Low cholesterol diet  DISCHARGE CONDITION:  Stable  ACTIVITY:  Activity as tolerated  OXYGEN:  Home Oxygen: No.   Oxygen Delivery: room air  DISCHARGE LOCATION:  home    ADDITIONAL DISCHARGE INSTRUCTION:   If you experience worsening of your admission symptoms, develop shortness of breath, life threatening emergency, suicidal or homicidal thoughts you must seek medical attention immediately by calling 911 or calling your MD immediately  if symptoms less severe.  You Must read complete instructions/literature along with all the possible adverse reactions/side effects for all the Medicines you take and that have been prescribed to you. Take any new Medicines after you have completely understood and accpet all the possible adverse reactions/side effects.   Please note  You were cared for by a hospitalist during your hospital stay. If you have any questions about your discharge medications or the care you received while you were in the hospital after you are discharged, you can call the unit and asked to speak with the hospitalist on call if the hospitalist that took care of you is not available. Once you are discharged, your primary care physician will handle any further medical issues. Please note that NO REFILLS for any discharge medications will be authorized once you are discharged, as it is imperative that you return to your primary care physician (or establish a relationship with a primary care physician if you do not have one) for your aftercare needs so that they can reassess your need for medications and monitor your lab values.

## 2015-08-05 LAB — NM MYOCAR MULTI W/SPECT W/WALL MOTION / EF
CHL CUP NUCLEAR SDS: 0
CHL CUP NUCLEAR SRS: 9
CHL CUP NUCLEAR SSS: 3
CHL CUP RESTING HR STRESS: 89 {beats}/min
CSEPEDS: 0 s
Estimated workload: 1 METS
Exercise duration (min): 1 min
LV sys vol: 26 mL
LVDIAVOL: 89 mL (ref 46–106)
NUC STRESS TID: 1.06
Peak HR: 104 {beats}/min

## 2015-08-05 LAB — T3: T3 TOTAL: 173 ng/dL (ref 71–180)

## 2015-08-05 NOTE — Discharge Summary (Signed)
Bonnie Garcia, 50 y.o., DOB 1965-11-02, MRN [id number]. Admission date: 08/03/2015 Discharge Date 08/05/2015 Primary MD Otilio Miu, MD Admitting Physician Loletha Grayer, MD  Admission Diagnosis  Chest pain [R07.9] Nonspecific chest pain [R07.9]  Discharge Diagnosis   Active Problems: Noncardiac  Chest pain Accelerated hypertension Diabetes type 2 Morbid obesity Likely sleep apnea       Hospital Course Bonnie Garcia is a 50 y.o. female with diabetes and hypertension. She presents with chest pain going on and off for the last month. Shortness of breath really bad over the past week. Patient was seen by her primary care provider and referred to the ED for admission. Patient had a CT scan of the chest which was negative for pulmonary embolism. She had EKG which was nonrevealing as well as cardiac enzymes which were negative. She was admitted to the hospital underwent a stress test which was negative for ischemia or infarct. Her chest pain is felt noncardiac. She is stable for discharge.            Consults  None  Significant Tests:  See full reports for all details      Dg Chest 2 View  08/03/2015  CLINICAL DATA:  Shortness of breath, chest pain EXAM: CHEST  2 VIEW COMPARISON:  None. FINDINGS: Lungs are clear.  No pleural effusion or pneumothorax. The heart is normal in size. Visualized osseous structures are within normal limits. IMPRESSION: Normal chest radiographs. Electronically Signed   By: Julian Hy M.D.   On: 08/03/2015 16:10   Ct Angio Chest Pe W/cm &/or Wo Cm  08/03/2015  CLINICAL DATA:  Chest pain and lethargy EXAM: CT ANGIOGRAPHY CHEST WITH CONTRAST TECHNIQUE: Multidetector CT imaging of the chest was performed using the standard protocol during bolus administration of intravenous contrast. Multiplanar CT image reconstructions and MIPs were obtained to evaluate the vascular anatomy. CONTRAST:  100 mL Isovue 370 nonionic COMPARISON:  Chest radiograph August 03, 2015 FINDINGS: Mediastinum/Lymph Nodes: There is no demonstrable pulmonary embolus. There is no thoracic aortic aneurysm or dissection. The visualized great vessels appear unremarkable. The pericardium is not appreciably thickened. There is enlargement of the right lobe of the thyroid compared to the left. There is an apparent dominant lesion within the right thyroid lobe measuring 2.5 x 1.5 cm. There is no appreciable thoracic adenopathy. There is moderate mediastinal fat. Lungs/Pleura: There is no parenchymal lung edema or consolidation. No pleural effusion evident. There is slight atelectatic change in the inferiorly. Upper abdomen: There is hepatic steatosis. There is left adrenal hypertrophy. Visualized upper abdominal structures otherwise appear unremarkable. There is modest reflux of contrast into the inferior vena cava. Contrast does not reflux into the hepatic veins. Musculoskeletal: There are no blastic or lytic bone lesions. Review of the MIP images confirms the above findings. IMPRESSION: No demonstrable pulmonary embolus. No thoracic aortic aneurysm or dissection. Modest reflux of contrast into the inferior vena cava. This finding may indicate a degree of elevated right heart pressure. Note, however, that contrast is not reflux into the hepatic veins. Mild atelectasis in the inferior lingula. No edema or consolidation. No adenopathy. Enlarged right lobe of thyroid with apparent dominant mass in the right lobe of the thyroid. Advise thyroid ultrasound to further assess. Hepatic steatosis.  Mild left adrenal hypertrophy. Electronically Signed   By: Lowella Grip III M.D.   On: 08/03/2015 20:48   Mr Jeri Cos [id number] Contrast  07/17/2015  CLINICAL DATA:  Headache and dizziness. Ringing in the ears, worse  on the left EXAM: MRI HEAD WITHOUT AND WITH CONTRAST TECHNIQUE: Multiplanar, multiecho pulse sequences of the brain and surrounding structures were obtained without and with intravenous contrast. CONTRAST:   69mL MULTIHANCE GADOBENATE DIMEGLUMINE 529 MG/ML IV SOLN COMPARISON:  None. FINDINGS: Symmetric and normal signal and enhancement at the temporal bones. There is no evidence of mass or labyrinthitis. There is patchy FLAIR hyperintensity within the pons, usually related to chronic small vessel disease, mild. Calvarium and upper cervical spine: No focal marrow signal abnormality. Orbits: Negative. Sinuses and Mastoids: Clear. Brain: No acute or remote infarct, hemorrhage, hydrocephalus, or mass lesion. No demyelinating pattern. No evidence of large vessel occlusion. IMPRESSION: 1. No specific explanation for symptoms. Normal appearance of the temporal bones and vestibular cochlear nerves. 2. Mild signal abnormality in the pons, likely early chronic microvascular ischemia. Electronically Signed   By: Monte Fantasia M.D.   On: 07/17/2015 11:37       Today   Subjective:   Bonnie Garcia  feeling better denies any chest pain or shortness of breath  Objective:   Blood pressure 132/68, pulse 85, temperature 98 F (36.7 C), temperature source Oral, resp. rate 19, weight 134.038 kg (295 lb 8 oz), last menstrual period 08/03/2015, SpO2 95 %.  . No intake or output data in the 24 hours ending 08/05/15 1310  Exam VITAL SIGNS: Blood pressure 132/68, pulse 85, temperature 98 F (36.7 C), temperature source Oral, resp. rate 19, weight 134.038 kg (295 lb 8 oz), last menstrual period 08/03/2015, SpO2 95 %.  GENERAL:  50 y.o.-year-old patient lying in the bed with no acute distress.  EYES: Pupils equal, round, reactive to light and accommodation. No scleral icterus. Extraocular muscles intact.  HEENT: Head atraumatic, normocephalic. Oropharynx and nasopharynx clear.  NECK:  Supple, no jugular venous distention. No thyroid enlargement, no tenderness.  LUNGS: Normal breath sounds bilaterally, no wheezing, rales,rhonchi or crepitation. No use of accessory muscles of respiration.  CARDIOVASCULAR: S1, S2 normal.  No murmurs, rubs, or gallops.  ABDOMEN: Soft, nontender, nondistended. Bowel sounds present. No organomegaly or mass.  EXTREMITIES: No pedal edema, cyanosis, or clubbing.  NEUROLOGIC: Cranial nerves II through XII are intact. Muscle strength 5/5 in all extremities. Sensation intact. Gait not checked.  PSYCHIATRIC: The patient is alert and oriented x 3.  SKIN: No obvious rash, lesion, or ulcer.   Data Review     CBC w Diff:  Lab Results  Component Value Date   WBC 10.2 08/04/2015   WBC 22.7* 01/17/2013   HGB 14.2 08/04/2015   HGB 16.9* 01/17/2013   HCT 41.0 08/04/2015   HCT 52.3* 01/17/2013   PLT 357 08/04/2015   PLT 535* 01/17/2013   LYMPHOPCT 19.5 01/17/2013   MONOPCT 3.1 01/17/2013   EOSPCT 1.2 01/17/2013   BASOPCT 1.0 01/17/2013   CMP:  Lab Results  Component Value Date   NA 134* 08/04/2015   NA 137 06/16/2013   K 4.0 08/04/2015   K 3.8 06/16/2013   CL 100* 08/04/2015   CL 107 06/16/2013   CO2 23 08/04/2015   CO2 26 06/16/2013   BUN 10 08/04/2015   BUN 8 06/16/2013   CREATININE 0.79 08/04/2015   CREATININE 0.71 06/16/2013   PROT 8.0 01/17/2013   ALBUMIN 4.1 01/17/2013   BILITOT 0.4 01/17/2013   ALKPHOS 75 01/17/2013   AST 16 01/17/2013   ALT 34 01/17/2013  .  Micro Results No results found for this or any previous visit (from the past 240 hour(s)).  Code Status History    Date Active Date Inactive Code Status Order ID Comments User Context   08/03/2015  8:07 PM 08/04/2015  6:49 PM Full Code [id number]  Loletha Grayer, MD ED          Follow-up Information    Follow up with Otilio Miu, MD In 7 days.   Specialty:  Family Medicine   Contact information:   [address] [place] [address] [phone]       Discharge Medications     Medication List    TAKE these medications        acetaminophen 500 MG tablet  Commonly known as:  TYLENOL  Take 1,500 mg by mouth every 6 (six) hours as needed for mild pain, fever or headache.      CRESTOR 10 MG tablet  Generic drug:  rosuvastatin  Take 10 mg by mouth daily.     DULoxetine 60 MG capsule  Commonly known as:  CYMBALTA  Take 60 mg by mouth at bedtime.     LORazepam 1 MG tablet  Commonly known as:  ATIVAN  Take 1 mg by mouth 2 (two) times daily as needed for anxiety.     meclizine 25 MG tablet  Commonly known as:  ANTIVERT  Take 1 tablet (25 mg total) by mouth 3 (three) times daily as needed for dizziness.     ondansetron 8 MG disintegrating tablet  Commonly known as:  ZOFRAN-ODT  Take 8 mg by mouth every 8 (eight) hours as needed for nausea or vomiting.     pantoprazole 40 MG tablet  Commonly known as:  PROTONIX  Take 40 mg by mouth 2 (two) times daily before a meal.     spironolactone 50 MG tablet  Commonly known as:  ALDACTONE  Take 75 mg by mouth 2 (two) times daily.     sucralfate 1 GM/10ML suspension  Commonly known as:  CARAFATE  Take 1 g by mouth 4 (four) times daily -  with meals and at bedtime.     VICTOZA 18 MG/3ML Sopn  Generic drug:  Liraglutide  Inject 1.8 mg into the skin at bedtime.     XIGDUO XR 07-998 MG Tb24  Generic drug:  Dapagliflozin-Metformin HCl ER  Take 1 tablet by mouth 2 (two) times daily.           Total Time in preparing paper work, data evaluation and todays exam - 35 minutes  Dustin Flock M.D on 08/05/2015 at 1:10 PM  [facility]   Office  [phone]

## 2015-08-06 MED ORDER — DULOXETINE HCL 30 MG PO CPEP: 30.0000 mg | ORAL_CAPSULE | Freq: Three times a day (TID) | ORAL | Status: DC

## 2015-08-06 NOTE — Addendum Note (Signed)
Addended by: Fredderick Severance on: 08/06/2015 02:39 PM   Modules accepted: Orders, Medications

## 2015-08-07 NOTE — Addendum Note (Signed)
Addended by: Fredderick Severance on: 08/07/2015 12:09 PM   Modules accepted: Orders

## 2015-08-23 ENCOUNTER — Ambulatory Visit: Admission: RE | Admit: 2015-08-23 | Payer: BC Managed Care – PPO | Source: Ambulatory Visit

## 2015-08-23 ENCOUNTER — Other Ambulatory Visit: Payer: BC Managed Care – PPO

## 2015-08-29 ENCOUNTER — Other Ambulatory Visit: Payer: Self-pay | Admitting: Internal Medicine

## 2015-08-29 DIAGNOSIS — E041 Nontoxic single thyroid nodule: Secondary | ICD-10-CM

## 2015-09-03 ENCOUNTER — Ambulatory Visit
Admission: RE | Admit: 2015-09-03 | Discharge: 2015-09-03 | Disposition: A | Discharge status: Home / Self Care | Payer: BC Managed Care – PPO | Source: Ambulatory Visit | Attending: Internal Medicine | Admitting: Internal Medicine

## 2015-09-03 DIAGNOSIS — E041 Nontoxic single thyroid nodule: Secondary | ICD-10-CM

## 2015-09-07 ENCOUNTER — Other Ambulatory Visit: Payer: Self-pay

## 2015-09-07 DIAGNOSIS — G4730 Sleep apnea, unspecified: Secondary | ICD-10-CM

## 2015-09-13 ENCOUNTER — Other Ambulatory Visit: Payer: Self-pay | Admitting: Family Medicine

## 2015-09-24 ENCOUNTER — Ambulatory Visit
Admission: RE | Admit: 2015-09-24 | Discharge: 2015-09-24 | Disposition: A | Discharge status: Home / Self Care | Payer: BC Managed Care – PPO | Source: Ambulatory Visit | Attending: Family Medicine | Admitting: Family Medicine

## 2015-09-24 DIAGNOSIS — R928 Other abnormal and inconclusive findings on diagnostic imaging of breast: Secondary | ICD-10-CM

## 2015-10-01 ENCOUNTER — Encounter: Payer: Self-pay | Admitting: Radiology

## 2015-10-01 ENCOUNTER — Ambulatory Visit
Admit: 2015-10-01 | Discharge: 2015-10-01 | Disposition: A | Discharge status: Home / Self Care | Payer: BC Managed Care – PPO | Attending: Family Medicine | Admitting: Family Medicine

## 2015-10-01 ENCOUNTER — Ambulatory Visit
Admission: RE | Admit: 2015-10-01 | Discharge: 2015-10-01 | Disposition: A | Discharge status: Home / Self Care | Payer: BC Managed Care – PPO | Source: Ambulatory Visit | Attending: Family Medicine | Admitting: Family Medicine

## 2015-10-01 ENCOUNTER — Ambulatory Visit: Admit: 2015-10-01 | Payer: BC Managed Care – PPO

## 2015-10-01 DIAGNOSIS — R928 Other abnormal and inconclusive findings on diagnostic imaging of breast: Secondary | ICD-10-CM | POA: Diagnosis not present

## 2015-10-02 ENCOUNTER — Other Ambulatory Visit: Payer: Self-pay | Admitting: Radiology

## 2015-10-03 ENCOUNTER — Other Ambulatory Visit: Payer: Self-pay | Admitting: Internal Medicine

## 2015-10-03 DIAGNOSIS — E041 Nontoxic single thyroid nodule: Secondary | ICD-10-CM

## 2015-10-15 ENCOUNTER — Ambulatory Visit
Admission: RE | Admit: 2015-10-15 | Discharge: 2015-10-15 | Disposition: A | Discharge status: Home / Self Care | Payer: BC Managed Care – PPO | Source: Ambulatory Visit | Attending: Internal Medicine | Admitting: Internal Medicine

## 2015-10-15 DIAGNOSIS — D34 Benign neoplasm of thyroid gland: Secondary | ICD-10-CM | POA: Diagnosis not present

## 2015-10-15 DIAGNOSIS — E041 Nontoxic single thyroid nodule: Secondary | ICD-10-CM | POA: Diagnosis present

## 2015-10-15 NOTE — Procedures (Signed)
US guided FNA of right thyroid nodule.  FNA x 5.  No immediate complication.

## 2015-10-16 LAB — CYTOLOGY - NON PAP

## 2015-10-26 ENCOUNTER — Ambulatory Visit: Payer: BC Managed Care – PPO | Attending: Neurology

## 2015-10-26 DIAGNOSIS — G4733 Obstructive sleep apnea (adult) (pediatric): Secondary | ICD-10-CM | POA: Diagnosis present

## 2015-10-26 DIAGNOSIS — G4761 Periodic limb movement disorder: Secondary | ICD-10-CM | POA: Diagnosis not present

## 2015-10-26 DIAGNOSIS — R0683 Snoring: Secondary | ICD-10-CM | POA: Diagnosis not present

## 2015-11-10 ENCOUNTER — Other Ambulatory Visit: Payer: Self-pay | Admitting: Family Medicine

## 2015-11-13 ENCOUNTER — Other Ambulatory Visit: Payer: Self-pay

## 2015-11-20 NOTE — Telephone Encounter (Signed)
Does patient need this appt?

## 2016-03-05 ENCOUNTER — Ambulatory Visit
Admission: EM | Admit: 2016-03-05 | Discharge: 2016-03-05 | Disposition: A | Discharge status: Home / Self Care | Payer: BC Managed Care – PPO | Attending: Family Medicine | Admitting: Family Medicine

## 2016-03-05 ENCOUNTER — Encounter: Payer: Self-pay | Admitting: Emergency Medicine

## 2016-03-05 DIAGNOSIS — J0100 Acute maxillary sinusitis, unspecified: Secondary | ICD-10-CM | POA: Diagnosis not present

## 2016-03-05 DIAGNOSIS — R059 Cough, unspecified: Secondary | ICD-10-CM

## 2016-03-05 DIAGNOSIS — R05 Cough: Secondary | ICD-10-CM | POA: Diagnosis not present

## 2016-03-05 LAB — RAPID STREP SCREEN (MED CTR MEBANE ONLY): Streptococcus, Group A Screen (Direct): NEGATIVE

## 2016-03-05 LAB — RAPID INFLUENZA A&B ANTIGENS
Influenza A (ARMC): NEGATIVE
Influenza B (ARMC): NEGATIVE

## 2016-03-05 MED ORDER — HYDROCOD POLST-CPM POLST ER 10-8 MG/5ML PO SUER: 5.0000 mL | Freq: Two times a day (BID) | ORAL | 0 refills | Status: DC | PRN

## 2016-03-05 MED ORDER — SULFAMETHOXAZOLE-TRIMETHOPRIM 800-160 MG PO TABS: 1.0000 | ORAL_TABLET | Freq: Two times a day (BID) | ORAL | 0 refills | Status: DC

## 2016-03-05 NOTE — ED Provider Notes (Signed)
[facility]    CSN: [id number] Arrival date & time: 03/05/16  1226     History   Chief Complaint Chief Complaint  Patient presents with  . Cough  . Sore Throat    HPI Bonnie Garcia is a 51 y.o. female.   The history is provided by the patient.  URI  Presenting symptoms: congestion, cough, facial pain, fatigue and fever   Severity:  Moderate Onset quality:  Sudden Duration:  6 days Timing:  Constant Progression:  Worsening Chronicity:  New Relieved by:  Nothing Ineffective treatments:  OTC medications Associated symptoms: headaches and sinus pain   Associated symptoms: no myalgias   Risk factors: diabetes mellitus and sick contacts   Risk factors: not elderly, no chronic cardiac disease, no chronic kidney disease, no chronic respiratory disease, no immunosuppression, no recent illness and no recent travel     Past Medical History:  Diagnosis Date  . ADHD (attention deficit hyperactivity disorder)   . Depression   . Diabetes mellitus without complication ([place])   . Hypertension   . Kidney stones     Patient Active Problem List   Diagnosis Date Noted  . Chest pain 08/03/2015    Past Surgical History:  Procedure Laterality Date  . APPENDECTOMY    . CHOLECYSTECTOMY    . COLONOSCOPY WITH PROPOFOL N/A 07/23/2015   Procedure: COLONOSCOPY WITH PROPOFOL;  Surgeon: Lollie Sails, MD;  Location: [facility] ENDOSCOPY;  Service: Endoscopy;  Laterality: N/A;  . ECTOPIC PREGNANCY SURGERY    . ENDOMETRIAL ABLATION    . ESOPHAGOGASTRODUODENOSCOPY (EGD) WITH PROPOFOL N/A 07/23/2015   Procedure: ESOPHAGOGASTRODUODENOSCOPY (EGD) WITH PROPOFOL;  Surgeon: Lollie Sails, MD;  Location: [facility] ENDOSCOPY;  Service: Endoscopy;  Laterality: N/A;  . TUBAL LIGATION      OB History    No data available       Home Medications    Prior to Admission medications   Medication Sig Start Date End Date Taking? Authorizing Provider  Liraglutide -Weight Management  (SAXENDA) 18 MG/3ML SOPN Inject into the skin daily.   Yes Historical Provider, MD  acetaminophen (TYLENOL) 500 MG tablet Take 1,500 mg by mouth every 6 (six) hours as needed for mild pain, fever or headache.    Historical Provider, MD  chlorpheniramine-HYDROcodone (TUSSIONEX PENNKINETIC ER) 10-8 MG/5ML SUER Take 5 mLs by mouth every 12 (twelve) hours as needed. 03/05/16   Norval Gable, MD  Dapagliflozin-Metformin HCl ER (XIGDUO XR) 07-998 MG TB24 Take 1 tablet by mouth 2 (two) times daily.     Historical Provider, MD  DULoxetine (CYMBALTA) 30 MG capsule TAKE 1 CAPSULE (30 MG TOTAL) BY MOUTH 3 (THREE) TIMES DAILY. 11/10/15   Juline Patch, MD  ibuprofen (ADVIL,MOTRIN) 100 MG tablet Take 100 mg by mouth every 6 (six) hours as needed for fever.    Historical Provider, MD  LORazepam (ATIVAN) 1 MG tablet Take 1 mg by mouth 2 (two) times daily as needed for anxiety.    Historical Provider, MD  meclizine (ANTIVERT) 25 MG tablet Take 1 tablet (25 mg total) by mouth 3 (three) times daily as needed for dizziness. 11/21/14   Lorin Picket, PA-C  ondansetron (ZOFRAN-ODT) 8 MG disintegrating tablet Take 8 mg by mouth every 8 (eight) hours as needed for nausea or vomiting.    Historical Provider, MD  pantoprazole (PROTONIX) 40 MG tablet Take 40 mg by mouth 2 (two) times daily before a meal.     Historical Provider, MD  spironolactone (ALDACTONE)  50 MG tablet Take 75 mg by mouth 2 (two) times daily.    Historical Provider, MD  sucralfate (CARAFATE) 1 GM/10ML suspension Take 1 g by mouth 4 (four) times daily -  with meals and at bedtime.     Historical Provider, MD  sulfamethoxazole-trimethoprim (BACTRIM DS,SEPTRA DS) 800-160 MG tablet Take 1 tablet by mouth 2 (two) times daily. 03/05/16   Norval Gable, MD    Family History Family History  Problem Relation Age of Onset  . Breast cancer Paternal Aunt   . Breast cancer Paternal Grandmother     Social History Social History  Substance Use Topics  . Smoking  status: Former Research scientist (life sciences)  . Smokeless tobacco: Never Used  . Alcohol use 0.0 oz/week     Allergies   Erythromycin and Penicillins   Review of Systems Review of Systems  Constitutional: Positive for fatigue and fever.  HENT: Positive for congestion and sinus pain.   Respiratory: Positive for cough.   Musculoskeletal: Negative for myalgias.  Neurological: Positive for headaches.     Physical Exam Triage Vital Signs ED Triage Vitals  Enc Vitals Group     BP 03/05/16 1330 (!) 159/92     Pulse Rate 03/05/16 1330 (!) 106     Resp 03/05/16 1330 16     Temp 03/05/16 1330 98.8 F (37.1 C)     Temp Source 03/05/16 1330 Oral     SpO2 03/05/16 1330 97 %     Weight 03/05/16 1327 290 lb (131.5 kg)     Height 03/05/16 1327 5\' 11"  (1.803 m)     Head Circumference --      Peak Flow --      Pain Score 03/05/16 1331 9     Pain Loc --      Pain Edu? --      Excl. in [place]? --    No data found.   Updated Vital Signs BP (!) 159/92 (BP Location: Right Arm)   Pulse (!) 106   Temp 98.8 F (37.1 C) (Oral)   Resp 16   Ht 5\' 11"  (1.803 m)   Wt 290 lb (131.5 kg)   SpO2 97%   BMI 40.45 kg/m   Visual Acuity Right Eye Distance:   Left Eye Distance:   Bilateral Distance:    Right Eye Near:   Left Eye Near:    Bilateral Near:     Physical Exam  Constitutional: She appears well-developed and well-nourished. No distress.  HENT:  Head: Normocephalic and atraumatic.  Right Ear: Tympanic membrane, external ear and ear canal normal.  Left Ear: Tympanic membrane, external ear and ear canal normal.  Nose: Mucosal edema and rhinorrhea present. No nose lacerations, sinus tenderness, nasal deformity, septal deviation or nasal septal hematoma. No epistaxis.  No foreign bodies. Right sinus exhibits maxillary sinus tenderness and frontal sinus tenderness. Left sinus exhibits maxillary sinus tenderness and frontal sinus tenderness.  Mouth/Throat: Uvula is midline, oropharynx is clear and moist and  mucous membranes are normal. No oropharyngeal exudate.  Eyes: Conjunctivae and EOM are normal. Pupils are equal, round, and reactive to light. Right eye exhibits no discharge. Left eye exhibits no discharge. No scleral icterus.  Neck: Normal range of motion. Neck supple. No thyromegaly present.  Cardiovascular: Normal rate, regular rhythm and normal heart sounds.   Pulmonary/Chest: Effort normal and breath sounds normal. No respiratory distress. She has no wheezes. She has no rales.  Lymphadenopathy:    She has no cervical adenopathy.  Skin:  She is not diaphoretic.  Nursing note and vitals reviewed.    UC Treatments / Results  Labs (all labs ordered are listed, but only abnormal results are displayed) Labs Reviewed  RAPID INFLUENZA A&B ANTIGENS ([facility] ONLY)  RAPID STREP SCREEN (NOT AT [facility])  CULTURE, GROUP A STREP [facility])    EKG  EKG Interpretation None       Radiology No results found.  Procedures Procedures (including critical care time)  Medications Ordered in UC Medications - No data to display   Initial Impression / Assessment and Plan / UC Course  I have reviewed the triage vital signs and the nursing notes.  Pertinent labs & imaging results that were available during my care of the patient were reviewed by me and considered in my medical decision making (see chart for details).  Clinical Course       Final Clinical Impressions(s) / UC Diagnoses   Final diagnoses:  Acute maxillary sinusitis, recurrence not specified  Cough    New Prescriptions Discharge Medication List as of 03/05/2016  1:55 PM    START taking these medications   Details  chlorpheniramine-HYDROcodone (TUSSIONEX PENNKINETIC ER) 10-8 MG/5ML SUER Take 5 mLs by mouth every 12 (twelve) hours as needed., Starting Wed 03/05/2016, Normal    sulfamethoxazole-trimethoprim (BACTRIM DS,SEPTRA DS) 800-160 MG tablet Take 1 tablet by mouth 2 (two) times daily., Starting Wed 03/05/2016, Normal       1.  Lab results and diagnosis reviewed with patient 2. rx as per orders above; reviewed possible side effects, interactions, risks and benefits  3. Recommend supportive treatment with rest, fluids 4. Follow-up prn if symptoms worsen or don't improve   Norval Gable, MD 03/05/16 1401

## 2016-03-05 NOTE — ED Triage Notes (Signed)
Patient c/o cough, sore throat and low grade fever that started Saturday.

## 2016-03-07 LAB — CULTURE, GROUP A STREP (THRC)

## 2016-03-09 ENCOUNTER — Telehealth: Payer: Self-pay

## 2016-03-09 MED ORDER — CLINDAMYCIN HCL 300 MG PO CAPS: 300.0000 mg | ORAL_CAPSULE | Freq: Three times a day (TID) | ORAL | 0 refills | Status: DC

## 2016-03-09 NOTE — Telephone Encounter (Signed)
-----   Message from Sherlene Shams, MD sent at 03/08/2016 10:28 PM EST ----- Bactrim does not reliably cover strep, but patient has only a few, non-group A strep and diagnosis was sinusitis.  No fever, no throat findings on exam.   Would see how patient is doing clinically.  If symptoms (sinus congestion, cough, facial pain, fever, fatigue) are improving, not necessary to change antibiotics. If having persistent fever >100.5 and severe sore throat, can change antibiotic to clindamycin 300mg  tid x 10d, #30 no refills.   Recheck or followup PCP for further evaluation as needed.  LM

## 2016-04-22 ENCOUNTER — Ambulatory Visit (INDEPENDENT_AMBULATORY_CARE_PROVIDER_SITE_OTHER): Payer: BC Managed Care – PPO | Admitting: Family Medicine

## 2016-04-22 VITALS — BP 120/88 | HR 64 | Temp 98.5°F | Ht 70.0 in | Wt 290.0 lb

## 2016-04-22 DIAGNOSIS — J219 Acute bronchiolitis, unspecified: Secondary | ICD-10-CM | POA: Diagnosis not present

## 2016-04-22 DIAGNOSIS — R059 Cough, unspecified: Secondary | ICD-10-CM

## 2016-04-22 DIAGNOSIS — Z23 Encounter for immunization: Secondary | ICD-10-CM | POA: Diagnosis not present

## 2016-04-22 DIAGNOSIS — B379 Candidiasis, unspecified: Secondary | ICD-10-CM | POA: Diagnosis not present

## 2016-04-22 DIAGNOSIS — R05 Cough: Secondary | ICD-10-CM

## 2016-04-22 DIAGNOSIS — J0100 Acute maxillary sinusitis, unspecified: Secondary | ICD-10-CM | POA: Diagnosis not present

## 2016-04-22 LAB — POCT INFLUENZA A/B
Influenza A, POC: NEGATIVE
Influenza B, POC: NEGATIVE

## 2016-04-22 MED ORDER — HYDROCOD POLST-CPM POLST ER 10-8 MG/5ML PO SUER: 5.0000 mL | Freq: Two times a day (BID) | ORAL | 0 refills | Status: DC

## 2016-04-22 MED ORDER — AZITHROMYCIN 250 MG PO TABS: ORAL_TABLET | ORAL | 1 refills | Status: DC

## 2016-04-22 MED ORDER — FLUCONAZOLE 150 MG PO TABS: 150.0000 mg | ORAL_TABLET | Freq: Once | ORAL | 0 refills | Status: AC

## 2016-04-22 NOTE — Progress Notes (Signed)
Name: Bonnie Garcia   MRN: [id number]    DOB: 1965-09-20   Date:04/22/2016       Progress Note  Subjective  Chief Complaint  Chief Complaint  Patient presents with  . Sinusitis    sore throat, cough at night- started Friday, but throat is getting worse    Sinusitis  This is a new problem. The current episode started in the past 7 days. The problem has been gradually worsening since onset. Maximum temperature: low grade. The fever has been present for 1 to 2 days. Associated symptoms include chills, congestion, coughing, diaphoresis, ear pain, headaches, a hoarse voice, shortness of breath, sinus pressure and a sore throat. Pertinent negatives include no neck pain, sneezing or swollen glands. (Productive nasal discharge /yellow) Past treatments include acetaminophen. The treatment provided moderate relief.  Cough  This is a new problem. The current episode started in the past 7 days. The cough is non-productive. Associated symptoms include chills, ear pain, headaches, nasal congestion, postnasal drip, a sore throat and shortness of breath. Pertinent negatives include no chest pain, ear congestion, fever, heartburn, hemoptysis, myalgias, rash, weight loss or wheezing. Treatments tried: prescription cough. The treatment provided no relief. Her past medical history is significant for bronchitis. There is no history of environmental allergies.    No problem-specific Assessment & Plan notes found for this encounter.   Past Medical History:  Diagnosis Date  . ADHD (attention deficit hyperactivity disorder)   . Depression   . Diabetes mellitus without complication ([place])   . Hypertension   . Kidney stones     Past Surgical History:  Procedure Laterality Date  . APPENDECTOMY    . CHOLECYSTECTOMY    . COLONOSCOPY WITH PROPOFOL N/A 07/23/2015   Procedure: COLONOSCOPY WITH PROPOFOL;  Surgeon: Lollie Sails, MD;  Location: [facility] ENDOSCOPY;  Service: Endoscopy;  Laterality: N/A;  . ECTOPIC  PREGNANCY SURGERY    . ENDOMETRIAL ABLATION    . ESOPHAGOGASTRODUODENOSCOPY (EGD) WITH PROPOFOL N/A 07/23/2015   Procedure: ESOPHAGOGASTRODUODENOSCOPY (EGD) WITH PROPOFOL;  Surgeon: Lollie Sails, MD;  Location: [facility] ENDOSCOPY;  Service: Endoscopy;  Laterality: N/A;  . TUBAL LIGATION      Family History  Problem Relation Age of Onset  . Breast cancer Paternal Aunt   . Breast cancer Paternal Grandmother     Social History   Social History  . Marital status: Married    Spouse name: N/A  . Number of children: N/A  . Years of education: N/A   Occupational History  . Not on file.   Social History Main Topics  . Smoking status: Former Research scientist (life sciences)  . Smokeless tobacco: Never Used  . Alcohol use 0.0 oz/week  . Drug use: No  . Sexual activity: Not on file   Other Topics Concern  . Not on file   Social History Narrative  . No narrative on file    Allergies  Allergen Reactions  . Erythromycin Other (See Comments)    Reaction:  GI upset   . Penicillins Hives and Other (See Comments)    Has patient had a PCN reaction causing immediate rash, facial/tongue/throat swelling, SOB or lightheadedness with hypotension: No Has patient had a PCN reaction causing severe rash involving mucus membranes or skin necrosis: No Has patient had a PCN reaction that required hospitalization No Has patient had a PCN reaction occurring within the last 10 years: No If all of the above answers are "NO", then may proceed with Cephalosporin use.    Outpatient Medications  Prior to Visit  Medication Sig Dispense Refill  . acetaminophen (TYLENOL) 500 MG tablet Take 1,500 mg by mouth every 6 (six) hours as needed for mild pain, fever or headache.    . Dapagliflozin-Metformin HCl ER (XIGDUO XR) 07-998 MG TB24 Take 1 tablet by mouth 2 (two) times daily.     Marland Kitchen ibuprofen (ADVIL,MOTRIN) 100 MG tablet Take 100 mg by mouth every 6 (six) hours as needed for fever.    . Liraglutide -Weight Management (SAXENDA) 18  MG/3ML SOPN Inject into the skin daily.    Marland Kitchen LORazepam (ATIVAN) 1 MG tablet Take 1 mg by mouth 2 (two) times daily as needed for anxiety.    . meclizine (ANTIVERT) 25 MG tablet Take 1 tablet (25 mg total) by mouth 3 (three) times daily as needed for dizziness. 9 tablet 0  . ondansetron (ZOFRAN-ODT) 8 MG disintegrating tablet Take 8 mg by mouth every 8 (eight) hours as needed for nausea or vomiting.    . pantoprazole (PROTONIX) 40 MG tablet Take 40 mg by mouth 2 (two) times daily before a meal.     . spironolactone (ALDACTONE) 50 MG tablet Take 75 mg by mouth 2 (two) times daily.    . sucralfate (CARAFATE) 1 GM/10ML suspension Take 1 g by mouth 4 (four) times daily -  with meals and at bedtime.     . DULoxetine (CYMBALTA) 30 MG capsule TAKE 1 CAPSULE (30 MG TOTAL) BY MOUTH 3 (THREE) TIMES DAILY. 90 capsule 0  . chlorpheniramine-HYDROcodone (TUSSIONEX PENNKINETIC ER) 10-8 MG/5ML SUER Take 5 mLs by mouth every 12 (twelve) hours as needed. 120 mL 0  . clindamycin (CLEOCIN) 300 MG capsule Take 1 capsule (300 mg total) by mouth 3 (three) times daily. 30 capsule 0  . sulfamethoxazole-trimethoprim (BACTRIM DS,SEPTRA DS) 800-160 MG tablet Take 1 tablet by mouth 2 (two) times daily. 20 tablet 0   No facility-administered medications prior to visit.     Review of Systems  Constitutional: Positive for chills and diaphoresis. Negative for fever, malaise/fatigue and weight loss.  HENT: Positive for congestion, ear pain, hoarse voice, postnasal drip, sinus pressure and sore throat. Negative for ear discharge and sneezing.   Eyes: Negative for blurred vision.  Respiratory: Positive for cough and shortness of breath. Negative for hemoptysis, sputum production and wheezing.   Cardiovascular: Negative for chest pain, palpitations and leg swelling.  Gastrointestinal: Negative for abdominal pain, blood in stool, constipation, diarrhea, heartburn, melena and nausea.  Genitourinary: Negative for dysuria, frequency,  hematuria and urgency.  Musculoskeletal: Negative for back pain, joint pain, myalgias and neck pain.  Skin: Negative for rash.  Neurological: Positive for headaches. Negative for dizziness, tingling, sensory change and focal weakness.  Endo/Heme/Allergies: Negative for environmental allergies and polydipsia. Does not bruise/bleed easily.  Psychiatric/Behavioral: Negative for depression and suicidal ideas. The patient is not nervous/anxious and does not have insomnia.      Objective  Vitals:   04/22/16 1515  BP: 120/88  Pulse: 64  Temp: 98.5 F (36.9 C)  TempSrc: Oral  Weight: 290 lb (131.5 kg)  Height: 5\' 10"  (1.778 m)    Physical Exam  Constitutional: She is well-developed, well-nourished, and in no distress. No distress.  HENT:  Head: Normocephalic and atraumatic.  Right Ear: External ear normal. A middle ear effusion is present.  Left Ear: External ear normal. A middle ear effusion is present.  Nose: Nose normal. Right sinus exhibits no maxillary sinus tenderness and no frontal sinus tenderness. Left sinus exhibits no  maxillary sinus tenderness and no frontal sinus tenderness.  Mouth/Throat: Posterior oropharyngeal erythema present.  Eyes: Conjunctivae and EOM are normal. Pupils are equal, round, and reactive to light. Right eye exhibits no discharge. Left eye exhibits no discharge.  Neck: Normal range of motion. Neck supple. No JVD present. No thyromegaly present.  Cardiovascular: Normal rate, regular rhythm, normal heart sounds and intact distal pulses.  Exam reveals no gallop and no friction rub.   No murmur heard. Pulmonary/Chest: Effort normal and breath sounds normal.  Abdominal: Soft. Bowel sounds are normal. She exhibits no mass. There is no tenderness. There is no guarding.  Musculoskeletal: Normal range of motion. She exhibits no edema.  Lymphadenopathy:    She has no cervical adenopathy.  Neurological: She is alert. She has normal reflexes.  Skin: Skin is warm  and dry. She is not diaphoretic.  Psychiatric: Mood and affect normal.  Nursing note and vitals reviewed.     Assessment & Plan  Problem List Items Addressed This Visit    None    Visit Diagnoses    Cough    -  Primary   Relevant Medications   azithromycin (ZITHROMAX) 250 MG tablet   chlorpheniramine-HYDROcodone (TUSSIONEX PENNKINETIC ER) 10-8 MG/5ML SUER   Other Relevant Orders   POCT Influenza A/B   Acute non-recurrent maxillary sinusitis       Relevant Medications   azithromycin (ZITHROMAX) 250 MG tablet   chlorpheniramine-HYDROcodone (TUSSIONEX PENNKINETIC ER) 10-8 MG/5ML SUER   fluconazole (DIFLUCAN) 150 MG tablet   Other Relevant Orders   POCT Influenza A/B   Bronchiolitis       Relevant Medications   azithromycin (ZITHROMAX) 250 MG tablet   chlorpheniramine-HYDROcodone (TUSSIONEX PENNKINETIC ER) 10-8 MG/5ML SUER   Candidiasis       Relevant Medications   azithromycin (ZITHROMAX) 250 MG tablet   fluconazole (DIFLUCAN) 150 MG tablet   Immunization due          Meds ordered this encounter  Medications  . azithromycin (ZITHROMAX) 250 MG tablet    Sig: As directed    Dispense:  6 tablet    Refill:  1  . chlorpheniramine-HYDROcodone (TUSSIONEX PENNKINETIC ER) 10-8 MG/5ML SUER    Sig: Take 5 mLs by mouth 2 (two) times daily.    Dispense:  140 mL    Refill:  0  . fluconazole (DIFLUCAN) 150 MG tablet    Sig: Take 1 tablet (150 mg total) by mouth once.    Dispense:  1 tablet    Refill:  0      Dr. Otilio Miu [facility]  04/22/16

## 2016-06-09 ENCOUNTER — Ambulatory Visit (INDEPENDENT_AMBULATORY_CARE_PROVIDER_SITE_OTHER): Payer: BC Managed Care – PPO | Admitting: Family Medicine

## 2016-06-09 VITALS — BP 142/88 | HR 88 | Ht 70.0 in | Wt 293.0 lb

## 2016-06-09 DIAGNOSIS — L03012 Cellulitis of left finger: Secondary | ICD-10-CM | POA: Diagnosis not present

## 2016-06-09 DIAGNOSIS — L980 Pyogenic granuloma: Secondary | ICD-10-CM

## 2016-06-09 MED ORDER — DOXYCYCLINE HYCLATE 100 MG PO TABS: 100.0000 mg | ORAL_TABLET | Freq: Two times a day (BID) | ORAL | 0 refills | Status: DC

## 2016-06-09 NOTE — Progress Notes (Signed)
Name: Bonnie Garcia   MRN: [id number]    DOB: [birth year]   Date:06/09/2016       Progress Note  Subjective  Chief Complaint  Chief Complaint  Patient presents with  . Blister    On right thumb. Been there for a 3-4 weeks now but won't go away. Does not ooz, but bleeds like crazy at times.    Rash  This is a new problem. The current episode started 1 to 4 weeks ago. The problem has been waxing and waning since onset. The affected locations include the right fingers (thumb). The rash is characterized by redness and swelling. She was exposed to nothing. Pertinent negatives include no cough, diarrhea, fever, joint pain, shortness of breath or sore throat. Past treatments include antibiotics (neosporin). The treatment provided mild relief.    No problem-specific Assessment & Plan notes found for this encounter.   Past Medical History:  Diagnosis Date  . ADHD (attention deficit hyperactivity disorder)   . Depression   . Diabetes mellitus without complication ([place])   . Hypertension   . Kidney stones     Past Surgical History:  Procedure Laterality Date  . APPENDECTOMY    . CHOLECYSTECTOMY    . COLONOSCOPY WITH PROPOFOL N/A 07/23/2015   Procedure: COLONOSCOPY WITH PROPOFOL;  Surgeon: Lollie Sails, MD;  Location: [facility] ENDOSCOPY;  Service: Endoscopy;  Laterality: N/A;  . ECTOPIC PREGNANCY SURGERY    . ENDOMETRIAL ABLATION    . ESOPHAGOGASTRODUODENOSCOPY (EGD) WITH PROPOFOL N/A 07/23/2015   Procedure: ESOPHAGOGASTRODUODENOSCOPY (EGD) WITH PROPOFOL;  Surgeon: Lollie Sails, MD;  Location: [facility] ENDOSCOPY;  Service: Endoscopy;  Laterality: N/A;  . TUBAL LIGATION      Family History  Problem Relation Age of Onset  . Breast cancer Paternal Aunt   . Breast cancer Paternal Grandmother     Social History   Social History  . Marital status: Married    Spouse name: N/A  . Number of children: N/A  . Years of education: N/A   Occupational History  . Not on file.   Social  History Main Topics  . Smoking status: Former Research scientist (life sciences)  . Smokeless tobacco: Never Used  . Alcohol use 0.0 oz/week  . Drug use: No  . Sexual activity: Not on file   Other Topics Concern  . Not on file   Social History Narrative  . No narrative on file    Allergies  Allergen Reactions  . Erythromycin Other (See Comments)    Reaction:  GI upset   . Penicillins Hives and Other (See Comments)    Has patient had a PCN reaction causing immediate rash, facial/tongue/throat swelling, SOB or lightheadedness with hypotension: No Has patient had a PCN reaction causing severe rash involving mucus membranes or skin necrosis: No Has patient had a PCN reaction that required hospitalization No Has patient had a PCN reaction occurring within the last 10 years: No If all of the above answers are "NO", then may proceed with Cephalosporin use.    Outpatient Medications Prior to Visit  Medication Sig Dispense Refill  . acetaminophen (TYLENOL) 500 MG tablet Take 1,500 mg by mouth every 6 (six) hours as needed for mild pain, fever or headache.    . Dapagliflozin-Metformin HCl ER (XIGDUO XR) 07-998 MG TB24 Take 1 tablet by mouth 2 (two) times daily.     . DULoxetine (CYMBALTA) 60 MG capsule Take 1 capsule by mouth 2 (two) times daily.    Marland Kitchen ibuprofen (ADVIL,MOTRIN) 100 MG tablet  Take 100 mg by mouth every 6 (six) hours as needed for fever.    . Liraglutide -Weight Management (SAXENDA) 18 MG/3ML SOPN Inject into the skin daily.    Marland Kitchen LORazepam (ATIVAN) 1 MG tablet Take 1 mg by mouth 2 (two) times daily as needed for anxiety.    . meclizine (ANTIVERT) 25 MG tablet Take 1 tablet (25 mg total) by mouth 3 (three) times daily as needed for dizziness. 9 tablet 0  . ondansetron (ZOFRAN-ODT) 8 MG disintegrating tablet Take 8 mg by mouth every 8 (eight) hours as needed for nausea or vomiting.    Marland Kitchen spironolactone (ALDACTONE) 50 MG tablet Take 75 mg by mouth 2 (two) times daily.    . sucralfate (CARAFATE) 1 GM/10ML  suspension Take 1 g by mouth 4 (four) times daily -  with meals and at bedtime.     Marland Kitchen azithromycin (ZITHROMAX) 250 MG tablet As directed (Patient not taking: Reported on 06/09/2016) 6 tablet 1  . chlorpheniramine-HYDROcodone (TUSSIONEX PENNKINETIC ER) 10-8 MG/5ML SUER Take 5 mLs by mouth 2 (two) times daily. (Patient not taking: Reported on 06/09/2016) 140 mL 0  . pantoprazole (PROTONIX) 40 MG tablet Take 40 mg by mouth 2 (two) times daily before a meal.      No facility-administered medications prior to visit.     Review of Systems  Constitutional: Negative for chills, fever, malaise/fatigue and weight loss.  HENT: Negative for ear discharge, ear pain and sore throat.   Eyes: Negative for blurred vision.  Respiratory: Negative for cough, sputum production, shortness of breath and wheezing.   Cardiovascular: Negative for chest pain, palpitations and leg swelling.  Gastrointestinal: Negative for abdominal pain, blood in stool, constipation, diarrhea, heartburn, melena and nausea.  Genitourinary: Negative for dysuria, frequency, hematuria and urgency.  Musculoskeletal: Negative for back pain, joint pain, myalgias and neck pain.  Skin: Positive for rash.  Neurological: Negative for dizziness, tingling, sensory change, focal weakness and headaches.  Endo/Heme/Allergies: Negative for environmental allergies and polydipsia. Does not bruise/bleed easily.  Psychiatric/Behavioral: Negative for depression and suicidal ideas. The patient is not nervous/anxious and does not have insomnia.      Objective  Vitals:   06/09/16 1644  BP: (!) 142/88  Pulse: 88  Weight: 293 lb (132.9 kg)  Height: 5\' 10"  (1.778 m)    Physical Exam  Constitutional: She is well-developed, well-nourished, and in no distress. No distress.  HENT:  Head: Normocephalic and atraumatic.  Right Ear: External ear normal.  Left Ear: External ear normal.  Nose: Nose normal.  Mouth/Throat: Oropharynx is clear and moist.  Eyes:  Conjunctivae and EOM are normal. Pupils are equal, round, and reactive to light. Right eye exhibits no discharge. Left eye exhibits no discharge.  Neck: Normal range of motion. Neck supple. No JVD present. No thyromegaly present.  Cardiovascular: Normal rate, regular rhythm, normal heart sounds and intact distal pulses.  Exam reveals no gallop and no friction rub.   No murmur heard. Pulmonary/Chest: Effort normal and breath sounds normal.  Abdominal: Soft. Bowel sounds are normal. She exhibits no mass. There is no tenderness. There is no guarding.  Musculoskeletal: Normal range of motion. She exhibits no edema.  Lymphadenopathy:    She has no cervical adenopathy.  Neurological: She is alert. She has normal reflexes.  Skin: Skin is warm and dry. Lesion noted. She is not diaphoretic. There is erythema.  Psychiatric: Mood and affect normal.  Nursing note and vitals reviewed.     Assessment & Plan  Problem List Items Addressed This Visit    None    Visit Diagnoses    Pyogenic granuloma    -  Primary   Relevant Orders   Ambulatory referral to Dermatology   Cellulitis of finger of left hand       Relevant Medications   doxycycline (VIBRA-TABS) 100 MG tablet      Meds ordered this encounter  Medications  . doxycycline (VIBRA-TABS) 100 MG tablet    Sig: Take 1 tablet (100 mg total) by mouth 2 (two) times daily.    Dispense:  20 tablet    Refill:  0      Dr. Deanna Jones [facility]  06/09/16

## 2016-06-09 NOTE — Patient Instructions (Signed)
Pyogenic Granuloma Pyogenic granuloma is a growth (lesion) that forms on the skin or on the mucous membranes of the mouth. This type of growth is a lump of very red tissue that bleeds easily. A pyogenic granuloma is usually a single lesion that most often affects:  The head and neck.  The mucous membranes of the mouth or tongue.  The upper body.  The hands and feet. A pyogenic granuloma usually measures about 0.5 inch (1.3 cm), but lesions can be smaller or larger. This condition does not spread from person to person (is not contagious). The lesion is not cancerous (benign). What are the causes? A pyogenic granuloma results from a reaction of your skin or mucous membranes. The reaction causes a mound of tiny blood vessels (capillaries) to form a lesion. This often happens after a minor injury, like pricking your skin or biting your lip or tongue. Sometimes it occurs without an injury. The exact cause of the reaction is not known. What increases the risk? The condition is more likely to develop in:  Pregnant women.  Children and young adults.  People who take certain medicines, especially acne treatment drugs, birth control pills, and some medicines used to treat cancer or HIV/AIDS. What are the signs or symptoms? The main symptom of this condition is a raised or lumpy lesion that is very red. The lesion may also:  Have a crusty, ulcerated surface.  Bleed easily.  Be slightly sore. How is this diagnosed? This condition is diagnosed based on your symptoms and medical history, especially if you recently had an injury. Your health care provider will also do a physical exam. Your health care provider may remove a small piece of the granuloma for testing (biopsy) to rule out cancer. How is this treated? A small lesion may go away without treatment. You may have to stop or change any medicines that caused the lesion. Pyogenic granulomas caused by pregnancy usually go away after delivery. If  your legion is large, irritated, or bleeds easily, you may need to have the lesion removed. This may involve:  Scraping away the lesion (curettage).  Using chemicals or electric energy to destroy the lesion.  Removing the lesion along with a small piece of normal skin or mucous membrane (surgical excision). This is the best treatment to prevent the lesion from coming back. Follow these instructions at home:  Take over-the-counter and prescription medicines only as told by your health care provider.  Keep all follow-up visits as told by your health care provider. This is important. Contact a health care provider if:  You have a fever.  Your lesion bleeds.  Your lesion comes back after treatment. This information is not intended to replace advice given to you by your health care provider. Make sure you discuss any questions you have with your health care provider. Document Released: 03/04/2015 Document Revised: 07/26/2015 Document Reviewed: 03/04/2015 Elsevier Interactive Patient Education  2017 [employer]  

## 2016-10-03 ENCOUNTER — Other Ambulatory Visit: Payer: Self-pay | Admitting: Internal Medicine

## 2016-10-03 DIAGNOSIS — E041 Nontoxic single thyroid nodule: Secondary | ICD-10-CM

## 2016-10-07 ENCOUNTER — Ambulatory Visit
Admission: RE | Admit: 2016-10-07 | Discharge: 2016-10-07 | Disposition: A | Discharge status: Home / Self Care | Payer: BC Managed Care – PPO | Source: Ambulatory Visit | Attending: Internal Medicine | Admitting: Internal Medicine

## 2016-10-07 DIAGNOSIS — E041 Nontoxic single thyroid nodule: Secondary | ICD-10-CM

## 2016-10-07 DIAGNOSIS — E042 Nontoxic multinodular goiter: Secondary | ICD-10-CM | POA: Insufficient documentation

## 2017-06-01 IMAGING — US US SOFT TISSUE HEAD/NECK
1 series · 13 of 25 positions shown · non-contrast
Comparison: Chest CT 08/03/2015

CLINICAL DATA: 49-year-old female with right-sided thyroid nodule
seen on prior chest CT

EXAM:
THYROID ULTRASOUND
TECHNIQUE: Ultrasound examination of the thyroid gland and adjacent soft
tissues was performed.

[Series 1: us soft tissue head/neck · 0.07mm/px · 13 of 55 slices shown]
[im 1/55]
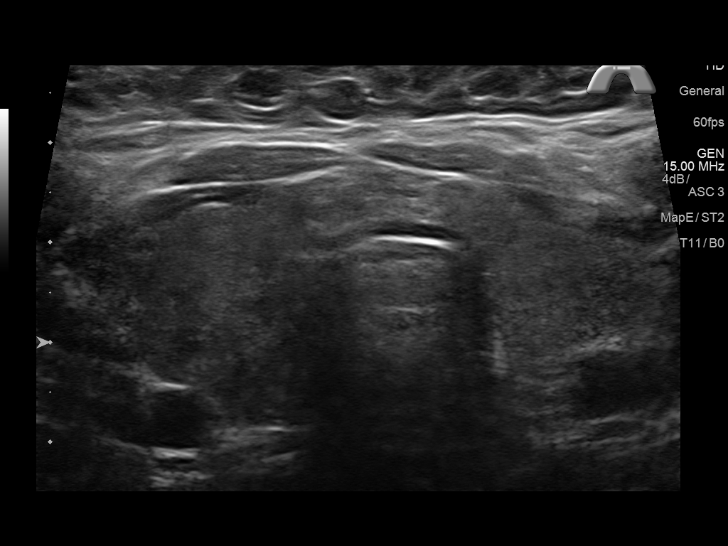
[im 5/55]
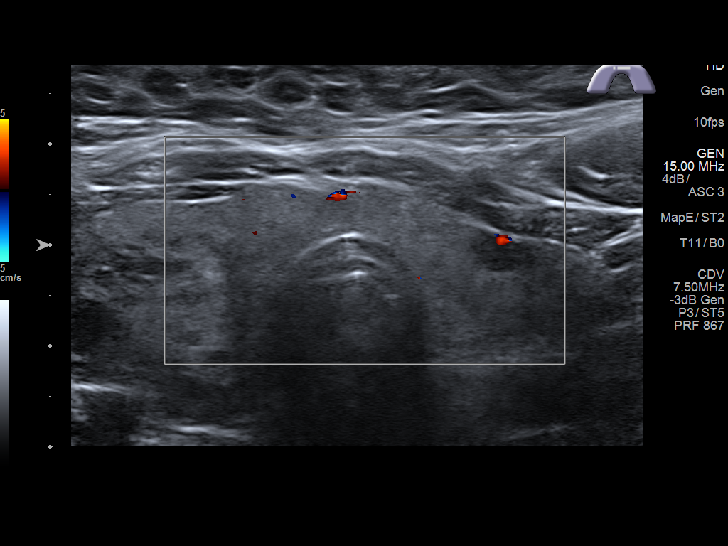
[im 10/55]
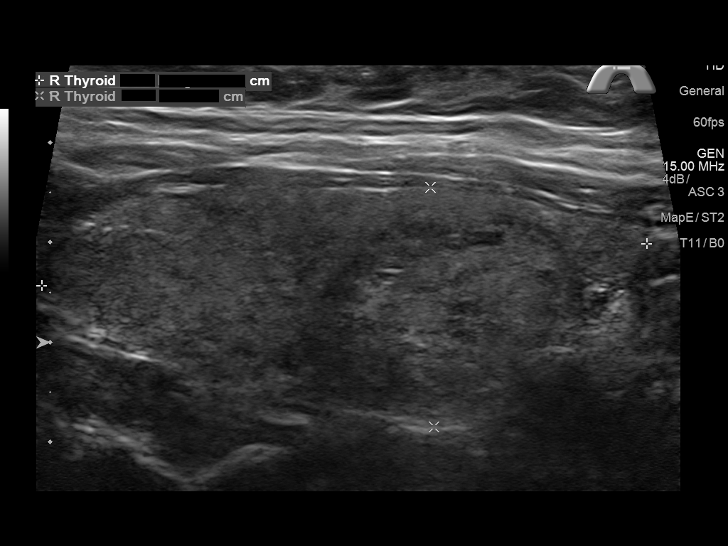
[im 14/55]
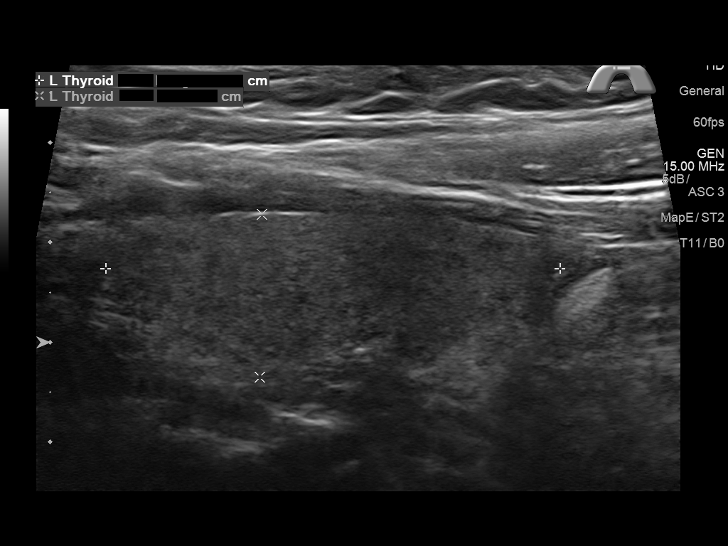
[im 19/55]
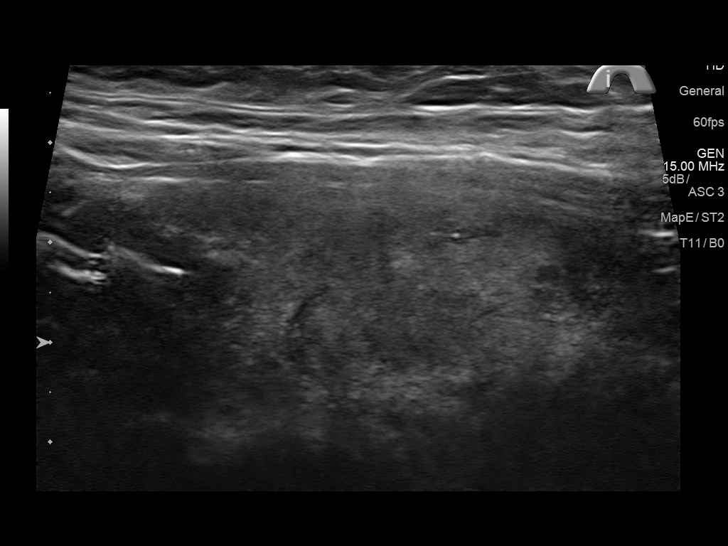
[im 23/55]
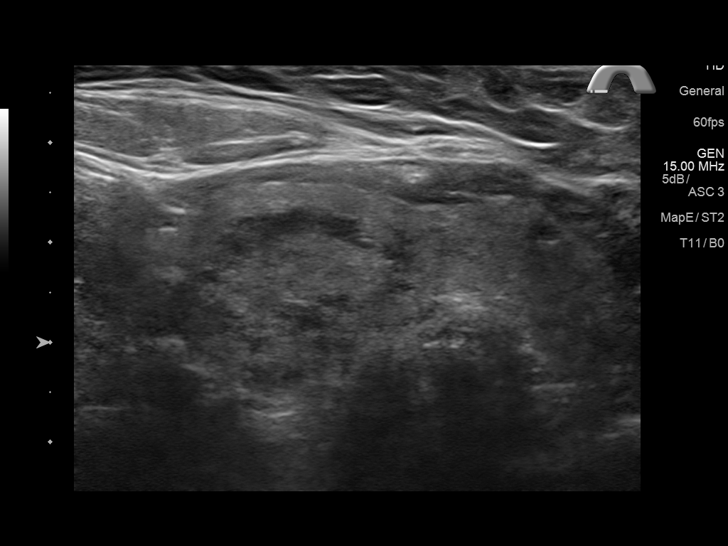
[im 28/55]
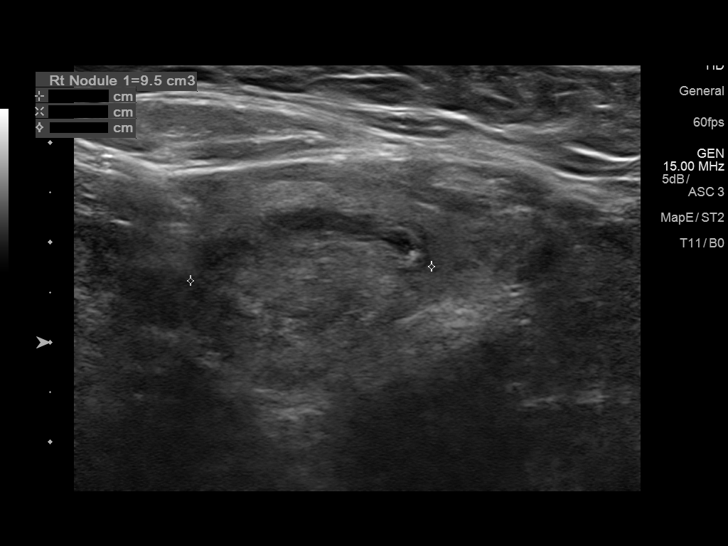
[im 32/55]
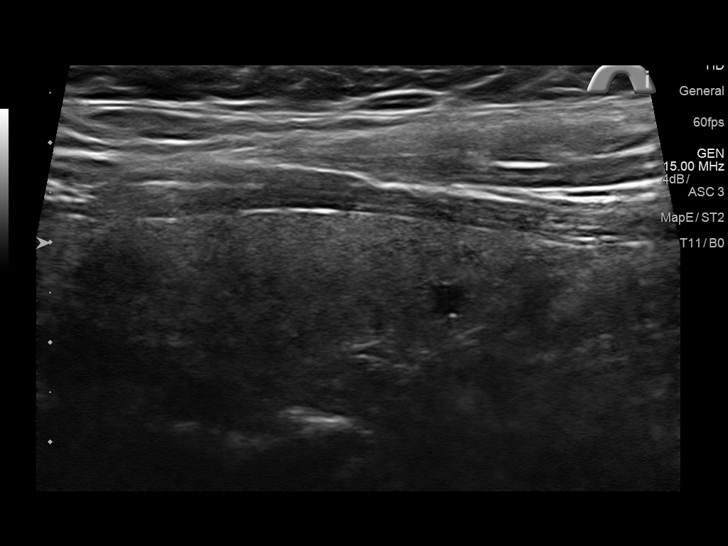
[im 37/55]
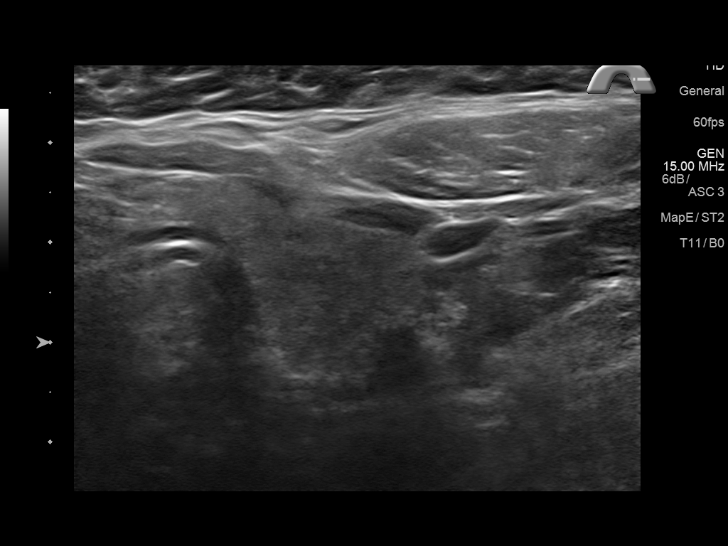
[im 41/55]
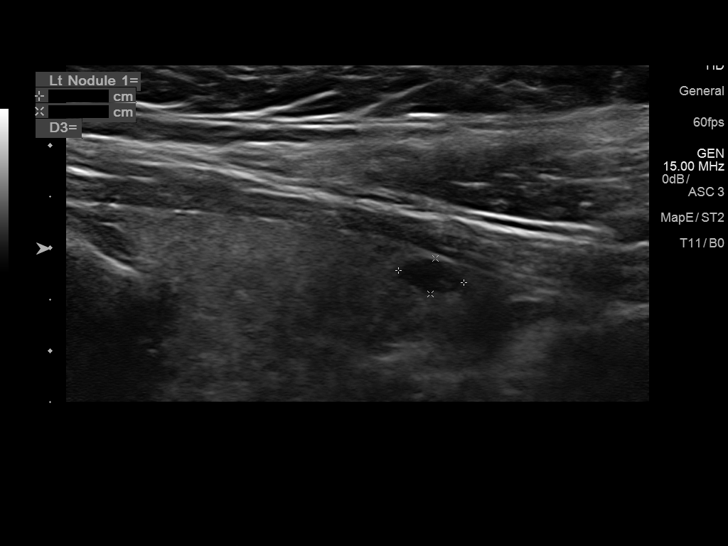
[im 46/55]
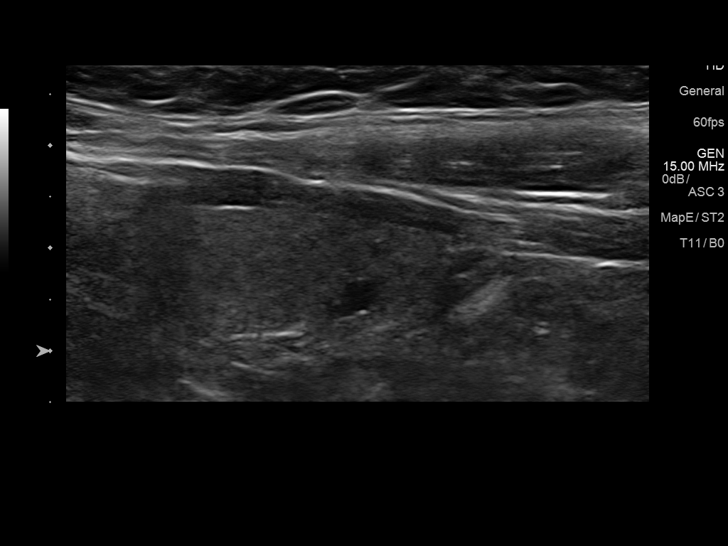
[im 50/55]
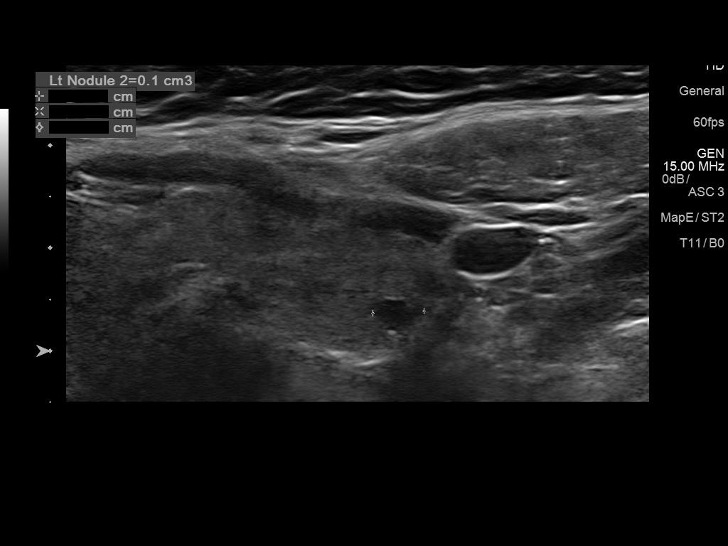
[im 55/55]
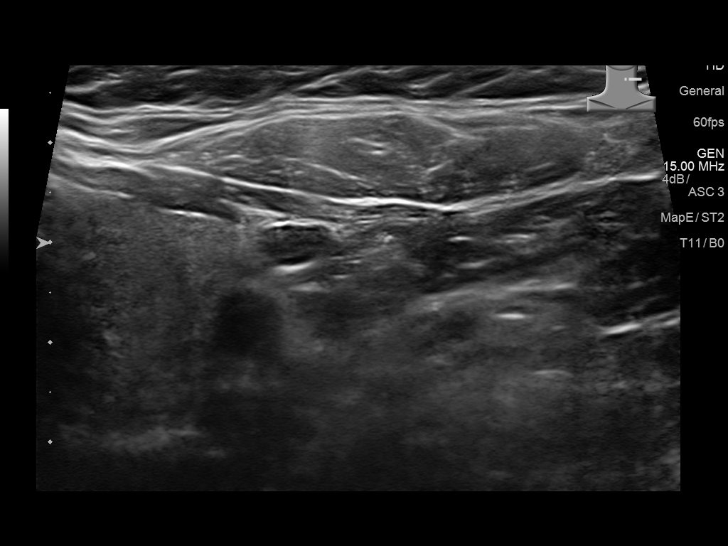

[13 of 25 positions shown; findings below may reference images not displayed]

FINDINGS: Right thyroid lobe

Measurements: 6.1 x 2.4 x 2.7 cm. Slightly hypoechoic heterogeneous
but predominantly solid nodule in the mid to lower gland measures
3.8 x 2.0 x 2.4 cm.

Left thyroid lobe

Measurements: 4.6 x 1.6 x 1.8 cm. Two adjacent hypoechoic nodules in
the lower gland measure 0.7 x 0.4 x 0.9 cm and 0.6 x 0.4 x 0.5 cm
respectively.

Isthmus

Thickness: 0.6 cm.  No nodules visualized.

Lymphadenopathy

None visualized.
IMPRESSION: 1. The 3.8 cm predominantly solid nodule in the right mid to lower
gland meets consensus criteria for further evaluation. Findings meet
consensus criteria for biopsy. Ultrasound-guided fine needle
aspiration should be considered, as per the consensus statement:
Management of Thyroid Nodules Detected at US: Society of
Radiologists in Ultrasound Consensus Conference Statement. Radiology
2777; [DATE].
2. Tiny sub cm nodules in the left lower gland are also noted
incidentally.

## 2017-06-10 ENCOUNTER — Ambulatory Visit
Admission: EM | Admit: 2017-06-10 | Discharge: 2017-06-10 | Disposition: A | Discharge status: Home / Self Care | Payer: BC Managed Care – PPO | Attending: Family Medicine | Admitting: Family Medicine

## 2017-06-10 ENCOUNTER — Encounter: Payer: Self-pay | Admitting: Emergency Medicine

## 2017-06-10 ENCOUNTER — Other Ambulatory Visit: Payer: Self-pay

## 2017-06-10 DIAGNOSIS — H1013 Acute atopic conjunctivitis, bilateral: Secondary | ICD-10-CM

## 2017-06-10 MED ORDER — OLOPATADINE HCL 0.1 % OP SOLN: 1.0000 [drp] | Freq: Two times a day (BID) | OPHTHALMIC | 0 refills | Status: AC

## 2017-06-10 NOTE — ED Provider Notes (Signed)
[facility] ____________________________________________  Time seen: Approximately 5:42 PM  I have reviewed the triage vital signs and the nursing notes.   HISTORY  Chief Complaint Conjunctivitis (L>R)  HPI Bonnie Garcia is a 52 y.o. female presenting for evaluation of bilateral eye redness and watering.  States felt completely fine yesterday.  States woke up this morning with eye redness and some matting shut.  States had some yellowish discharge from both eyes this morning but that the discharge has improved throughout the day.  States left eye redness and irritation is slightly worse to the left than the right.  States eyes felt fine last night.  States has only had watery discharge from both eyes later today.  States eyes feel somewhat irritated but not painful.  States eyes have very minimal blurry vision, but states only from watering, wants eyes are clean vision is normal.  Does not wear glasses or contacts.  denies light sensitivity, vision loss, foreign body sensation, concern for a body or abrupt onset.  States that she does work at an [employer].  Denies known seasonal allergies.  Denies accompanying cough, congestion or drainage.  Denies fevers.   Did try some over-the-counter allergy eye flush without change. Denies chest pain, shortness of breath, abdominal pain, or rash. Denies recent sickness. Denies recent antibiotic use.   Juline Patch, MD: PCP No LMP recorded. Patient has had an ablation.   Past Medical History:  Diagnosis Date  . ADHD (attention deficit hyperactivity disorder)   . Depression   . Diabetes mellitus without complication ([place])   . Hypertension   . Kidney stones     Patient Active Problem List   Diagnosis Date Noted  . Chest pain 08/03/2015    Past Surgical History:  Procedure Laterality Date  . APPENDECTOMY    . CHOLECYSTECTOMY    . COLONOSCOPY WITH PROPOFOL N/A 07/23/2015   Procedure: COLONOSCOPY WITH PROPOFOL;   Surgeon: Lollie Sails, MD;  Location: [facility] ENDOSCOPY;  Service: Endoscopy;  Laterality: N/A;  . ECTOPIC PREGNANCY SURGERY    . ENDOMETRIAL ABLATION    . ESOPHAGOGASTRODUODENOSCOPY (EGD) WITH PROPOFOL N/A 07/23/2015   Procedure: ESOPHAGOGASTRODUODENOSCOPY (EGD) WITH PROPOFOL;  Surgeon: Lollie Sails, MD;  Location: [facility] ENDOSCOPY;  Service: Endoscopy;  Laterality: N/A;  . TUBAL LIGATION       No current facility-administered medications for this encounter.   Current Outpatient Medications:  .  ALPRAZolam (XANAX) 0.5 MG tablet, Take 1 tablet by mouth 2 (two) times daily as needed for anxiety., Disp: , Rfl: 3 .  ARIPiprazole (ABILIFY) 5 MG tablet, Take 1 tablet by mouth daily., Disp: , Rfl:  .  Dapagliflozin-Metformin HCl ER (XIGDUO XR) 07-998 MG TB24, Take 1 tablet by mouth 2 (two) times daily. , Disp: , Rfl:  .  DULoxetine (CYMBALTA) 60 MG capsule, Take 1 capsule by mouth 2 (two) times daily., Disp: , Rfl:  .  VYVANSE 70 MG capsule, Take 1 capsule by mouth daily., Disp: , Rfl: 0 .  zolpidem (AMBIEN) 10 MG tablet, Take 1 tablet by mouth at bedtime as needed., Disp: , Rfl:  .  olopatadine (PATANOL) 0.1 % ophthalmic solution, Place 1 drop into both eyes 2 (two) times daily for 7 days., Disp: 5 mL, Rfl: 0  Allergies Erythromycin and Penicillins  Family History  Problem Relation Age of Onset  . Breast cancer Paternal Aunt   . Breast cancer Paternal Grandmother   . Diabetes Mother   . Diabetes Father   .  Hypertension Father   . Heart disease Father   . Parkinson's disease Father     Social History Social History   Tobacco Use  . Smoking status: Former Smoker    Last attempt to quit: 2009    Years since quitting: 10.2  . Smokeless tobacco: Never Used  Substance Use Topics  . Alcohol use: Yes    Alcohol/week: 0.0 oz    Comment: socially  . Drug use: No    Review of Systems Constitutional: No fever/chills Eyes: As above.  ENT: No sore throat. Cardiovascular: Denies  chest pain. Respiratory: Denies shortness of breath. Gastrointestinal: No abdominal pain.   Musculoskeletal: Negative for back pain. Skin: Negative for rash.   ____________________________________________   PHYSICAL EXAM:  VITAL SIGNS: ED Triage Vitals  Enc Vitals Group     BP 06/10/17 1648 (!) 150/80     Pulse Rate 06/10/17 1648 (!) 101     Resp 06/10/17 1648 16     Temp 06/10/17 1648 98.1 F (36.7 C)     Temp Source 06/10/17 1648 Oral     SpO2 06/10/17 1648 98 %     Weight 06/10/17 1647 280 lb (127 kg)     Height 06/10/17 1647 5\' 11"  (1.803 m)     Head Circumference --      Peak Flow --      Pain Score 06/10/17 1647 0     Pain Loc --      Pain Edu? --      Excl. in [place]? --      Visual Acuity  Right Eye Distance: 20/40 Left Eye Distance: 20/40 Bilateral Distance: 20/40  Constitutional: Alert and oriented. Well appearing and in no acute distress. Eyes: Mild bilateral diffuse conjunctival injection.  Bilateral eyes nontender.  No foreign body visualized bilaterally.  No drainage bilaterally.  No surrounding tenderness, swelling or erythema bilaterally.  PERRL. EOMI. Head: Atraumatic. No sinus tenderness to palpation. No swelling. No erythema.  Ears: no erythema, normal TMs bilaterally.   Nose:No nasal congestion  Mouth/Throat: Mucous membranes are moist. No pharyngeal erythema. No tonsillar swelling or exudate.  Neck: No stridor.  No cervical spine tenderness to palpation. Hematological/Lymphatic/Immunilogical: No cervical lymphadenopathy. Cardiovascular: Normal rate, regular rhythm. Grossly normal heart sounds.  Good peripheral circulation. Respiratory: Normal respiratory effort.  No retractions. No wheezes, rales or rhonchi. Good air movement.  Musculoskeletal: Ambulatory with steady gait.  Neurologic:  Normal speech and language. No gait instability. Skin:  Skin appears warm, dry and intact. No rash noted. Psychiatric: Mood and affect are normal. Speech and behavior  are normal.  ___________________________________________   LABS (all labs ordered are listed, but only abnormal results are displayed)  Labs Reviewed - No data to display   PROCEDURES Procedures    INITIAL IMPRESSION / ASSESSMENT AND PLAN / ED COURSE  Pertinent labs & imaging results that were available during my care of the patient were reviewed by me and considered in my medical decision making (see chart for details).  Well-appearing patient.  No acute distress.  Suspect allergic conjunctivitis, recommend oral antihistamines, supportive care and Rx for Patanol given.  Encourage supportive care.Discussed indication, risks and benefits of medications with patient.  Discussed follow up with Primary care physician this week. Discussed follow up and return parameters including no resolution or any worsening concerns. Patient verbalized understanding and agreed to plan.   ____________________________________________   FINAL CLINICAL IMPRESSION(S) / ED DIAGNOSES  Final diagnoses:  Allergic conjunctivitis of both eyes  ED Discharge Orders        Ordered    olopatadine (PATANOL) 0.1 % ophthalmic solution  2 times daily     06/10/17 1731       Note: This dictation was prepared with Dragon dictation along with smaller phrase technology. Any transcriptional errors that result from this process are unintentional.         Marylene Land, NP 06/10/17 1904

## 2017-06-10 NOTE — Discharge Instructions (Addendum)
Use medication as prescribed. Drink plenty of fluids. Keep hands clean.  Follow up with your primary care physician this week as needed. Return to Urgent care for new or worsening concerns.

## 2017-06-10 NOTE — ED Triage Notes (Signed)
Patient in today c/o bilateral conjunctivitis. Patient states that when she awoke this morning her eyes were matted shut. They are now red and slightly burning. R>L

## 2017-09-10 ENCOUNTER — Encounter: Payer: Self-pay | Admitting: Emergency Medicine

## 2017-09-10 ENCOUNTER — Emergency Department
Admission: EM | Admit: 2017-09-10 | Discharge: 2017-09-11 | Disposition: A | Discharge status: Other Acute Inpatient Hospital | Payer: BC Managed Care – PPO | Attending: Emergency Medicine | Admitting: Emergency Medicine

## 2017-09-10 ENCOUNTER — Other Ambulatory Visit: Payer: Self-pay

## 2017-09-10 DIAGNOSIS — Z7982 Long term (current) use of aspirin: Secondary | ICD-10-CM | POA: Diagnosis not present

## 2017-09-10 DIAGNOSIS — I152 Hypertension secondary to endocrine disorders: Secondary | ICD-10-CM

## 2017-09-10 DIAGNOSIS — Z79899 Other long term (current) drug therapy: Secondary | ICD-10-CM | POA: Diagnosis not present

## 2017-09-10 DIAGNOSIS — F329 Major depressive disorder, single episode, unspecified: Secondary | ICD-10-CM | POA: Diagnosis present

## 2017-09-10 DIAGNOSIS — E119 Type 2 diabetes mellitus without complications: Secondary | ICD-10-CM

## 2017-09-10 DIAGNOSIS — F909 Attention-deficit hyperactivity disorder, unspecified type: Secondary | ICD-10-CM | POA: Diagnosis not present

## 2017-09-10 DIAGNOSIS — Z87891 Personal history of nicotine dependence: Secondary | ICD-10-CM | POA: Insufficient documentation

## 2017-09-10 DIAGNOSIS — R45851 Suicidal ideations: Secondary | ICD-10-CM | POA: Insufficient documentation

## 2017-09-10 DIAGNOSIS — E1159 Type 2 diabetes mellitus with other circulatory complications: Secondary | ICD-10-CM

## 2017-09-10 DIAGNOSIS — F332 Major depressive disorder, recurrent severe without psychotic features: Secondary | ICD-10-CM | POA: Diagnosis not present

## 2017-09-10 DIAGNOSIS — Z7984 Long term (current) use of oral hypoglycemic drugs: Secondary | ICD-10-CM | POA: Diagnosis not present

## 2017-09-10 DIAGNOSIS — I10 Essential (primary) hypertension: Secondary | ICD-10-CM

## 2017-09-10 LAB — COMPREHENSIVE METABOLIC PANEL
ALT: 29 U/L (ref 0–44)
AST: 43 U/L — AB (ref 15–41)
Albumin: 4.6 g/dL (ref 3.5–5.0)
Alkaline Phosphatase: 53 U/L (ref 38–126)
Anion gap: 16 — ABNORMAL HIGH (ref 5–15)
BILIRUBIN TOTAL: 1 mg/dL (ref 0.3–1.2)
BUN: 7 mg/dL (ref 6–20)
CALCIUM: 9.2 mg/dL (ref 8.9–10.3)
CO2: 18 mmol/L — ABNORMAL LOW (ref 22–32)
CREATININE: 0.71 mg/dL (ref 0.44–1.00)
Chloride: 103 mmol/L (ref 98–111)
Glucose, Bld: 156 mg/dL — ABNORMAL HIGH (ref 70–99)
Potassium: 3.2 mmol/L — ABNORMAL LOW (ref 3.5–5.1)
Sodium: 137 mmol/L (ref 135–145)
TOTAL PROTEIN: 7.8 g/dL (ref 6.5–8.1)

## 2017-09-10 LAB — CBC
HCT: 48.3 % — ABNORMAL HIGH (ref 35.0–47.0)
HEMOGLOBIN: 16.8 g/dL — AB (ref 12.0–16.0)
MCH: 30.7 pg (ref 26.0–34.0)
MCHC: 34.8 g/dL (ref 32.0–36.0)
MCV: 88 fL (ref 80.0–100.0)
PLATELETS: 469 10*3/uL — AB (ref 150–440)
RBC: 5.49 MIL/uL — AB (ref 3.80–5.20)
RDW: 12.7 % (ref 11.5–14.5)
WBC: 13.1 10*3/uL — AB (ref 3.6–11.0)

## 2017-09-10 LAB — ETHANOL

## 2017-09-10 LAB — ACETAMINOPHEN LEVEL

## 2017-09-10 LAB — SALICYLATE LEVEL

## 2017-09-10 NOTE — ED Notes (Signed)
Pt's husband given password with patient's permission.

## 2017-09-10 NOTE — ED Notes (Signed)
Attempted to stick x 1, pt became diaphoretic, patient laid back and states she feels better, given cold cloth at this time. Pt states she has hx of same.

## 2017-09-10 NOTE — ED Triage Notes (Signed)
Pt presents to ED via POV with c/o feeling suicidal. Pt states hx of depression that has progressively worsened over the last few weeks, denies any changes at home to make her feel this way. Pt denies any kind of plan at this time, pt noted to be tearful in triage. Pt states "I don't want to be by myself, everything feels dark when I'm by myself and I feel scared".

## 2017-09-10 NOTE — ED Notes (Signed)
Pt. Talking to [facility] Psychiatrist.

## 2017-09-10 NOTE — ED Provider Notes (Signed)
[facility] Emergency Department Provider Note  ____________________________________________   First MD Initiated Contact with Patient 09/10/17 1939     (approximate)  I have reviewed the triage vital signs and the nursing notes.   HISTORY  Chief Complaint Suicidal   HPI Bonnie Garcia is a 52 y.o. female who self presents to the emergency department with vague suicidal ideation and increasing depression for the past several weeks.  She has a long-standing history of depression for which she takes Cymbalta however she feels like it is not been helping.  She is having marital issues which seem to make the symptoms worse.  Nothing particular makes it better.  She says she did not drink or use drugs or try to hurt herself today however she was unable to get an appointment to see her psychiatrist for the next several days and her therapist advised her to come to the emergency department for further evaluation.    Past Medical History:  Diagnosis Date  . ADHD (attention deficit hyperactivity disorder)   . Depression   . Diabetes mellitus without complication ([place])   . Hypertension   . Kidney stones     Patient Active Problem List   Diagnosis Date Noted  . Chest pain 08/03/2015    Past Surgical History:  Procedure Laterality Date  . APPENDECTOMY    . CHOLECYSTECTOMY    . COLONOSCOPY WITH PROPOFOL N/A 07/23/2015   Procedure: COLONOSCOPY WITH PROPOFOL;  Surgeon: Lollie Sails, MD;  Location: [facility] ENDOSCOPY;  Service: Endoscopy;  Laterality: N/A;  . ECTOPIC PREGNANCY SURGERY    . ENDOMETRIAL ABLATION    . ESOPHAGOGASTRODUODENOSCOPY (EGD) WITH PROPOFOL N/A 07/23/2015   Procedure: ESOPHAGOGASTRODUODENOSCOPY (EGD) WITH PROPOFOL;  Surgeon: Lollie Sails, MD;  Location: [facility] ENDOSCOPY;  Service: Endoscopy;  Laterality: N/A;  . TUBAL LIGATION      Prior to Admission medications   Medication Sig Start Date End Date Taking? Authorizing Provider    ALPRAZolam Duanne Moron) 0.5 MG tablet Take 1 tablet by mouth 2 (two) times daily as needed for anxiety. 05/20/17  Yes [provider]  aspirin EC 81 MG tablet Take 81 mg by mouth daily.   Yes [provider]  Dapagliflozin-Metformin HCl ER (XIGDUO XR) 07-998 MG TB24 Take 1 tablet by mouth 2 (two) times daily.    Yes [provider]  DULoxetine (CYMBALTA) 60 MG capsule Take 1 capsule by mouth 2 (two) times daily.   Yes [provider]  rosuvastatin (CRESTOR) 5 MG tablet Take 5 mg by mouth daily. 06/12/17  Yes [provider]  Vitamin D, Ergocalciferol, (DRISDOL) 50000 units CAPS capsule Take 50,000 Units by mouth every Friday.   Yes [provider]  VYVANSE 50 MG capsule Take 50 mg by mouth daily.  05/20/17  Yes [provider]  zolpidem (AMBIEN) 10 MG tablet Take 1 tablet by mouth at bedtime as needed for sleep.    Yes [provider]    Allergies Erythromycin and Penicillins  Family History  Problem Relation Age of Onset  . Breast cancer Paternal Aunt   . Breast cancer Paternal Grandmother   . Diabetes Mother   . Diabetes Father   . Hypertension Father   . Heart disease Father   . Parkinson's disease Father     Social History Social History   Tobacco Use  . Smoking status: Former Smoker    Last attempt to quit: 2009    Years since quitting: 10.5  . Smokeless  tobacco: Never Used  Substance Use Topics  . Alcohol use: Yes    Alcohol/week: 0.0 oz    Comment: socially  . Drug use: No    Review of Systems Constitutional: No fever/chills Eyes: No visual changes. ENT: No sore throat. Cardiovascular: Denies chest pain. Respiratory: Denies shortness of breath. Gastrointestinal: No abdominal pain.  No nausea, no vomiting.  No diarrhea.  No constipation. Genitourinary: Negative for dysuria. Musculoskeletal: Negative for back pain. Skin: Negative for rash. Neurological: Negative for headaches, focal weakness or  numbness.   ____________________________________________   PHYSICAL EXAM:  VITAL SIGNS: ED Triage Vitals  Enc Vitals Group     BP 09/10/17 1854 (!) 165/105     Pulse Rate 09/10/17 1854 (!) 102     Resp 09/10/17 1854 20     Temp 09/10/17 1854 98.7 F (37.1 C)     Temp Source 09/10/17 1854 Oral     SpO2 09/10/17 1854 98 %     Weight 09/10/17 1855 280 lb (127 kg)     Height 09/10/17 1855 5\' 10"  (1.778 m)     Head Circumference --      Peak Flow --      Pain Score 09/10/17 1855 0     Pain Loc --      Pain Edu? --      Excl. in [place]? --     Constitutional: Alert and oriented x4 very sad affect nontoxic no diaphoresis Eyes: PERRL EOMI. Head: Atraumatic. Nose: No congestion/rhinnorhea. Mouth/Throat: No trismus Neck: No stridor.   Cardiovascular: Normal rate, regular rhythm. Grossly normal heart sounds.  Good peripheral circulation. Respiratory: Normal respiratory effort.  No retractions. Lungs CTAB and moving good air Gastrointestinal: Soft nontender Musculoskeletal: No lower extremity edema   Neurologic:  Normal speech and language. No gross focal neurologic deficits are appreciated. Skin:  Skin is warm, dry and intact. No rash noted. Psychiatric: Sad affect    ____________________________________________   DIFFERENTIAL includes but not limited to  Suicidal ideation, depression, suicide attempt, drug overdose ____________________________________________   LABS (all labs ordered are listed, but only abnormal results are displayed)  Labs Reviewed  COMPREHENSIVE METABOLIC PANEL - Abnormal; Notable for the following components:      Result Value   Potassium 3.2 (*)    CO2 18 (*)    Glucose, Bld 156 (*)    AST 43 (*)    Anion gap 16 (*)    All other components within normal limits  ACETAMINOPHEN LEVEL - Abnormal; Notable for the following components:   Acetaminophen (Tylenol), Serum <10 (*)    All other components within normal limits  CBC - Abnormal; Notable for  the following components:   WBC 13.1 (*)    RBC 5.49 (*)    Hemoglobin 16.8 (*)    HCT 48.3 (*)    Platelets 469 (*)    All other components within normal limits  ETHANOL  SALICYLATE LEVEL  URINE DRUG SCREEN, QUALITATIVE ([facility] ONLY)    Lab work reviewed by me with elevated white count which is nonspecific __________________________________________  EKG   ____________________________________________  RADIOLOGY   ____________________________________________   PROCEDURES  Procedure(s) performed: no  Procedures  Critical Care performed: no  ____________________________________________   INITIAL IMPRESSION / ASSESSMENT AND PLAN / ED COURSE  Pertinent labs & imaging results that were available during my care of the patient were reviewed by me and considered in my medical decision making (see chart for details).   The patient agrees to  stay and speak to a psychiatrist tonight.  Her lab work is unremarkable and she has no acute medical issues.  She is medically stable for psychiatric evaluation.      ____________________________________________   FINAL CLINICAL IMPRESSION(S) / ED DIAGNOSES  Final diagnoses:  Suicidal ideation      NEW MEDICATIONS STARTED DURING THIS VISIT:  New Prescriptions   No medications on file     Note:  This document was prepared using Dragon voice recognition software and may include unintentional dictation errors.     Darel Hong, MD 09/11/17 [phone]

## 2017-09-10 NOTE — ED Notes (Signed)
Hourly rounding reveals patient sleeping in room. No complaints, stable, in no acute distress. Q15 minute rounds and monitoring via Security Cameras to continue. 

## 2017-09-10 NOTE — ED Notes (Signed)
1 pair gray earrings, 1 pair flip flops, 1 t-shirt, 1 bra, 1 shorts, 1 pair underwear, 1 cell phone. Pt changed out with this RN and Marcie Bal, EDT.

## 2017-09-10 NOTE — ED Notes (Signed)
Hourly rounding reveals patient in room. No complaints, stable, in no acute distress. Q15 minute rounds and monitoring via Security Cameras to continue. 

## 2017-09-10 NOTE — ED Notes (Signed)
Pt. Transferred to [facility] from ED to room 4 after screening for contraband. Report to include Situation, Background, Assessment and Recommendations from Kim Summers RN. Pt. Oriented to unit including Q15 minute rounds as well as the security cameras for their protection. Patient is alert and oriented, warm and dry in no acute distress. Patient denies SI, HI, and AVH. Pt. Encouraged to let me know if needs arise. 

## 2017-09-10 NOTE — ED Notes (Signed)
Hourly rounding reveals patient in room. No complaints, stable, in no acute distress. Q15 minute rounds and monitoring via Security Cameras to continue.Snack and beverage given. 

## 2017-09-11 ENCOUNTER — Other Ambulatory Visit: Payer: Self-pay

## 2017-09-11 ENCOUNTER — Inpatient Hospital Stay
Admission: AD | Admit: 2017-09-11 | Discharge: 2017-09-14 | DRG: 885 | Disposition: A | Discharge status: Home / Self Care | Payer: BC Managed Care – PPO | Source: Intra-hospital | Attending: Psychiatry | Admitting: Psychiatry

## 2017-09-11 DIAGNOSIS — F329 Major depressive disorder, single episode, unspecified: Secondary | ICD-10-CM | POA: Diagnosis not present

## 2017-09-11 DIAGNOSIS — Z7982 Long term (current) use of aspirin: Secondary | ICD-10-CM | POA: Diagnosis not present

## 2017-09-11 DIAGNOSIS — E1159 Type 2 diabetes mellitus with other circulatory complications: Secondary | ICD-10-CM | POA: Diagnosis present

## 2017-09-11 DIAGNOSIS — I152 Hypertension secondary to endocrine disorders: Secondary | ICD-10-CM

## 2017-09-11 DIAGNOSIS — Z818 Family history of other mental and behavioral disorders: Secondary | ICD-10-CM

## 2017-09-11 DIAGNOSIS — E119 Type 2 diabetes mellitus without complications: Secondary | ICD-10-CM

## 2017-09-11 DIAGNOSIS — F332 Major depressive disorder, recurrent severe without psychotic features: Principal | ICD-10-CM | POA: Diagnosis present

## 2017-09-11 DIAGNOSIS — I10 Essential (primary) hypertension: Secondary | ICD-10-CM

## 2017-09-11 DIAGNOSIS — Z87891 Personal history of nicotine dependence: Secondary | ICD-10-CM | POA: Diagnosis not present

## 2017-09-11 DIAGNOSIS — Z79899 Other long term (current) drug therapy: Secondary | ICD-10-CM

## 2017-09-11 DIAGNOSIS — R45851 Suicidal ideations: Secondary | ICD-10-CM | POA: Diagnosis present

## 2017-09-11 DIAGNOSIS — F909 Attention-deficit hyperactivity disorder, unspecified type: Secondary | ICD-10-CM | POA: Diagnosis present

## 2017-09-11 LAB — URINE DRUG SCREEN, QUALITATIVE (ARMC ONLY)
Amphetamines, Ur Screen: NOT DETECTED
Benzodiazepine, Ur Scrn: NOT DETECTED
CANNABINOID 50 NG, UR ~~LOC~~: NOT DETECTED
COCAINE METABOLITE, UR ~~LOC~~: NOT DETECTED
MDMA (ECSTASY) UR SCREEN: NOT DETECTED
Methadone Scn, Ur: NOT DETECTED
OPIATE, UR SCREEN: NOT DETECTED
PHENCYCLIDINE (PCP) UR S: NOT DETECTED
TRICYCLIC, UR SCREEN: NOT DETECTED

## 2017-09-11 LAB — GLUCOSE, CAPILLARY: Glucose-Capillary: 243 mg/dL — ABNORMAL HIGH (ref 70–99)

## 2017-09-11 MED ORDER — ASPIRIN EC 81 MG PO TBEC
81.0000 mg | DELAYED_RELEASE_TABLET | Freq: Every day | ORAL | Status: DC
  Administered 2017-09-11: 81 mg via ORAL
  Filled 2017-09-11: qty 1

## 2017-09-11 MED ORDER — ROSUVASTATIN CALCIUM 5 MG PO TABS
5.0000 mg | ORAL_TABLET | Freq: Every day | ORAL | Status: DC
  Administered 2017-09-12 – 2017-09-13 (×2): 5 mg via ORAL
  Filled 2017-09-11 (×3): qty 1

## 2017-09-11 MED ORDER — ASPIRIN EC 81 MG PO TBEC
81.0000 mg | DELAYED_RELEASE_TABLET | Freq: Every day | ORAL | Status: DC
  Administered 2017-09-12 – 2017-09-14 (×3): 81 mg via ORAL
  Filled 2017-09-11 (×3): qty 1

## 2017-09-11 MED ORDER — ALPRAZOLAM 0.5 MG PO TABS: 0.5000 mg | ORAL_TABLET | Freq: Three times a day (TID) | ORAL | Status: DC | PRN

## 2017-09-11 MED ORDER — INSULIN ASPART 100 UNIT/ML ~~LOC~~ SOLN
0.0000 [IU] | Freq: Three times a day (TID) | SUBCUTANEOUS | Status: DC
  Administered 2017-09-11 – 2017-09-12 (×2): 5 [IU] via SUBCUTANEOUS
  Administered 2017-09-12: 2 [IU] via SUBCUTANEOUS
  Administered 2017-09-12: 3 [IU] via SUBCUTANEOUS
  Administered 2017-09-13 (×2): 2 [IU] via SUBCUTANEOUS
  Administered 2017-09-13: 5 [IU] via SUBCUTANEOUS
  Administered 2017-09-14: 2 [IU] via SUBCUTANEOUS
  Administered 2017-09-14: 5 [IU] via SUBCUTANEOUS
  Filled 2017-09-11 (×3): qty 1

## 2017-09-11 MED ORDER — ALPRAZOLAM 0.5 MG PO TABS
0.5000 mg | ORAL_TABLET | Freq: Three times a day (TID) | ORAL | Status: DC | PRN
  Administered 2017-09-11: 0.5 mg via ORAL
  Filled 2017-09-11: qty 1

## 2017-09-11 MED ORDER — HYDROXYZINE HCL 25 MG PO TABS: 25.0000 mg | ORAL_TABLET | Freq: Three times a day (TID) | ORAL | Status: DC | PRN

## 2017-09-11 MED ORDER — ALUM & MAG HYDROXIDE-SIMETH 200-200-20 MG/5ML PO SUSP: 30.0000 mL | ORAL | Status: DC | PRN

## 2017-09-11 MED ORDER — LISDEXAMFETAMINE DIMESYLATE 50 MG PO CAPS
50.0000 mg | ORAL_CAPSULE | Freq: Every day | ORAL | Status: DC
  Filled 2017-09-11: qty 1

## 2017-09-11 MED ORDER — DULOXETINE HCL 30 MG PO CPEP
60.0000 mg | ORAL_CAPSULE | Freq: Two times a day (BID) | ORAL | Status: DC
  Administered 2017-09-12: 60 mg via ORAL
  Filled 2017-09-11: qty 2

## 2017-09-11 MED ORDER — INSULIN ASPART 100 UNIT/ML ~~LOC~~ SOLN
0.0000 [IU] | Freq: Three times a day (TID) | SUBCUTANEOUS | Status: DC
  Administered 2017-09-11: 3 [IU] via SUBCUTANEOUS
  Filled 2017-09-11: qty 1

## 2017-09-11 MED ORDER — MAGNESIUM HYDROXIDE 400 MG/5ML PO SUSP: 30.0000 mL | Freq: Every day | ORAL | Status: DC | PRN

## 2017-09-11 MED ORDER — METFORMIN HCL ER 500 MG PO TB24
1000.0000 mg | ORAL_TABLET | Freq: Every day | ORAL | Status: DC
  Administered 2017-09-12 – 2017-09-14 (×3): 1000 mg via ORAL
  Filled 2017-09-11 (×3): qty 2

## 2017-09-11 MED ORDER — TRAZODONE HCL 100 MG PO TABS
100.0000 mg | ORAL_TABLET | Freq: Every evening | ORAL | Status: DC | PRN
  Administered 2017-09-11: 100 mg via ORAL
  Filled 2017-09-11: qty 1

## 2017-09-11 MED ORDER — ROSUVASTATIN CALCIUM 5 MG PO TABS
5.0000 mg | ORAL_TABLET | Freq: Every day | ORAL | Status: DC
  Administered 2017-09-11: 5 mg via ORAL
  Filled 2017-09-11: qty 1

## 2017-09-11 MED ORDER — DAPAGLIFLOZIN PRO-METFORMIN ER 5-1000 MG PO TB24: 1.0000 | ORAL_TABLET | Freq: Every day | ORAL | Status: DC

## 2017-09-11 MED ORDER — DULOXETINE HCL 60 MG PO CPEP
60.0000 mg | ORAL_CAPSULE | Freq: Two times a day (BID) | ORAL | Status: DC
  Administered 2017-09-11: 60 mg via ORAL
  Filled 2017-09-11: qty 1

## 2017-09-11 MED ORDER — LISDEXAMFETAMINE DIMESYLATE 30 MG PO CAPS
50.0000 mg | ORAL_CAPSULE | Freq: Every day | ORAL | Status: DC
  Filled 2017-09-11: qty 1

## 2017-09-11 MED ORDER — ACETAMINOPHEN 325 MG PO TABS
650.0000 mg | ORAL_TABLET | Freq: Four times a day (QID) | ORAL | Status: DC | PRN
  Administered 2017-09-11: 650 mg via ORAL
  Filled 2017-09-11: qty 2

## 2017-09-11 MED ORDER — METFORMIN HCL ER 500 MG PO TB24
1000.0000 mg | ORAL_TABLET | Freq: Every day | ORAL | Status: DC
  Filled 2017-09-11: qty 2

## 2017-09-11 NOTE — BH Assessment (Signed)
Patient is to be admitted to [facility] by Dr. Weber Cooks.  Attending Physician will be Dr. Audie Box.   Patient has been assigned to room 323, by [place] Nurse T'Ywan.   Intake Paper Work has been signed and placed on patient chart.  ER staff is aware of the admission:  [place], ER Sectary   Dr. Lessie Dings, ER MD   Santiago Glad, Patient's Nurse   Meredith Mody, Patient Access.

## 2017-09-11 NOTE — Consult Note (Signed)
[facility] Psychiatry Consult   Reason for Consult: Consult for 52 year old woman who comes to the emergency room voluntarily for treatment of anxiety and depression Referring Physician: Joni Fears Patient Identification: Bonnie Garcia MRN:  [id number] Principal Diagnosis: Severe recurrent major depression without psychotic features [facility]) Diagnosis:   Patient Active Problem List   Diagnosis Date Noted  . Severe recurrent major depression without psychotic features ([place]) [F33.2] 09/11/2017  . ADHD (attention deficit hyperactivity disorder) [F90.9] 09/11/2017  . Diabetes mellitus without complication ([place]) [B63.8] 09/11/2017  . Hypertension [I10] 09/11/2017  . Chest pain [R07.9] 08/03/2015    Total Time spent with patient: 1 hour  Subjective:   Bonnie Garcia is a 52 y.o. female patient admitted with "I am feeling like I do not want to be here".  HPI: Patient interviewed chart reviewed.  52 year old woman describes long-standing problems with depression and anxiety going on for years.  She says for about the last 6 months she has been so depressed she can hardly get out of bed.  However in the last 2-3 weeks something new has happened.  She says she has been very agitated.  She cries constantly.  She is sweaty all the time.  Feels panicky symptoms much of the time.  Only sleeps a few hours from 5:55 in the morning.  Still feels depressed.  Having thoughts about suicide without a specific plan.  Denies any hallucinations.  Patient is seeing an outpatient psychiatrist and therapist.  Current medicine include Cymbalta 60 mg twice a day, Vyvanse 50 mg a day and Xanax as needed.  Only recent changes were when her Vyvanse was cut back from 70-50 mg.  She says she is been on that dose of Cymbalta for a long time.  No specific new major stresses except for some family members with health problems.  Social history: Lives with her husband.  Does not work outside the home.  Says her relationship  is a good one.  Medical history: Diabetes managed with oral medicine, dyslipidemia, overweight  Substance abuse history: Patient denies alcohol or drug use.  Nothing in the chart about substance abuse  Past Psychiatric History: No previous psychiatric hospitalization.  No history of suicide attempts or violence.  Has been seeing an outpatient psychiatrist for quite a while.  Current medicine is Cymbalta which does not appear to be effective.  She says past medicines have included Zoloft Paxil Effexor and Wellbutrin which were also not effective.  No history of mania or psychosis that I can tell.  Risk to Self: Suicidal Ideation: Yes-Currently Present Suicidal Intent: No Is patient at risk for suicide?: No, but patient needs Medical Clearance Suicidal Plan?: No Access to Means: Yes Specify Access to Suicidal Means: prescribed medications, sharp objects What has been your use of drugs/alcohol within the last 12 months?: none described How many times?: 0 Other Self Harm Risks: none indicated Triggers for Past Attempts: None known Intentional Self Injurious Behavior: None Risk to Others: Homicidal Ideation: No Thoughts of Harm to Others: No Current Homicidal Intent: No Current Homicidal Plan: No Access to Homicidal Means: No Identified Victim: none indicated History of harm to others?: No Assessment of Violence: None Noted Violent Behavior Description: none indicated Does patient have access to weapons?: No Criminal Charges Pending?: No Does patient have a court date: No Prior Inpatient Therapy: Prior Inpatient Therapy: No Prior Outpatient Therapy: Prior Outpatient Therapy: No Does patient have an ACCT team?: No Does patient have Intensive In-House Services?  : No Does  patient have [facility] services? : No Does patient have P4CC services?: No  Past Medical History:  Past Medical History:  Diagnosis Date  . ADHD (attention deficit hyperactivity disorder)   . Depression   .  Diabetes mellitus without complication ([place])   . Hypertension   . Kidney stones     Past Surgical History:  Procedure Laterality Date  . APPENDECTOMY    . CHOLECYSTECTOMY    . COLONOSCOPY WITH PROPOFOL N/A 07/23/2015   Procedure: COLONOSCOPY WITH PROPOFOL;  Surgeon: Lollie Sails, MD;  Location: [facility] ENDOSCOPY;  Service: Endoscopy;  Laterality: N/A;  . ECTOPIC PREGNANCY SURGERY    . ENDOMETRIAL ABLATION    . ESOPHAGOGASTRODUODENOSCOPY (EGD) WITH PROPOFOL N/A 07/23/2015   Procedure: ESOPHAGOGASTRODUODENOSCOPY (EGD) WITH PROPOFOL;  Surgeon: Lollie Sails, MD;  Location: [facility] ENDOSCOPY;  Service: Endoscopy;  Laterality: N/A;  . TUBAL LIGATION     Family History:  Family History  Problem Relation Age of Onset  . Breast cancer Paternal Aunt   . Breast cancer Paternal Grandmother   . Diabetes Mother   . Diabetes Father   . Hypertension Father   . Heart disease Father   . Parkinson's disease Father    Family Psychiatric  History: Patient says her father had problems with depression Social History:  Social History   Substance and Sexual Activity  Alcohol Use Yes  . Alcohol/week: 0.0 oz   Comment: socially     Social History   Substance and Sexual Activity  Drug Use No    Social History   Socioeconomic History  . Marital status: Married    Spouse name: Not on file  . Number of children: Not on file  . Years of education: Not on file  . Highest education level: Not on file  Occupational History  . Not on file  Social Needs  . Financial resource strain: Not on file  . Food insecurity:    Worry: Not on file    Inability: Not on file  . Transportation needs:    Medical: Not on file    Non-medical: Not on file  Tobacco Use  . Smoking status: Former Smoker    Last attempt to quit: 2009    Years since quitting: 10.5  . Smokeless tobacco: Never Used  Substance and Sexual Activity  . Alcohol use: Yes    Alcohol/week: 0.0 oz    Comment: socially  . Drug use: No   . Sexual activity: Not on file  Lifestyle  . Physical activity:    Days per week: Not on file    Minutes per session: Not on file  . Stress: Not on file  Relationships  . Social connections:    Talks on phone: Not on file    Gets together: Not on file    Attends religious service: Not on file    Active member of club or organization: Not on file    Attends meetings of clubs or organizations: Not on file    Relationship status: Not on file  Other Topics Concern  . Not on file  Social History Narrative  . Not on file   Additional Social History:    Allergies:   Allergies  Allergen Reactions  . Erythromycin Other (See Comments)    Reaction:  GI upset   . Penicillins Hives and Other (See Comments)    Has patient had a PCN reaction causing immediate rash, facial/tongue/throat swelling, SOB or lightheadedness with hypotension: No Has patient had a PCN reaction  causing severe rash involving mucus membranes or skin necrosis: No Has patient had a PCN reaction that required hospitalization No Has patient had a PCN reaction occurring within the last 10 years: No If all of the above answers are "NO", then may proceed with Cephalosporin use.    Labs:  Results for orders placed or performed during the hospital encounter of 09/10/17 (from the past 48 hour(s))  Comprehensive metabolic panel     Status: Abnormal   Collection Time: 09/10/17  6:58 PM  Result Value Ref Range   Sodium 137 135 - 145 mmol/L   Potassium 3.2 (L) 3.5 - 5.1 mmol/L   Chloride 103 98 - 111 mmol/L    Comment: Please note change in reference range.   CO2 18 (L) 22 - 32 mmol/L   Glucose, Bld 156 (H) 70 - 99 mg/dL    Comment: Please note change in reference range.   BUN 7 6 - 20 mg/dL    Comment: Please note change in reference range.   Creatinine, Ser 0.71 0.44 - 1.00 mg/dL   Calcium 9.2 8.9 - 10.3 mg/dL   Total Protein 7.8 6.5 - 8.1 g/dL   Albumin 4.6 3.5 - 5.0 g/dL   AST 43 (H) 15 - 41 U/L   ALT 29 0 - 44  U/L    Comment: Please note change in reference range.   Alkaline Phosphatase 53 38 - 126 U/L   Total Bilirubin 1.0 0.3 - 1.2 mg/dL   GFR calc non Af Amer >60 >60 mL/min   GFR calc Af Amer >60 >60 mL/min    Comment: (NOTE) The eGFR has been calculated using the CKD EPI equation. This calculation has not been validated in all clinical situations. eGFR's persistently <60 mL/min signify possible Chronic Kidney Disease.    Anion gap 16 (H) 5 - 15    Comment: Performed at [facility], [address] [place], [place] [address]  Ethanol     Status: None   Collection Time: 09/10/17  6:58 PM  Result Value Ref Range   Alcohol, Ethyl (B) <10 <10 mg/dL    Comment: (NOTE) Lowest detectable limit for serum alcohol is 10 mg/dL. For medical purposes only. Performed at [facility], [address] [place], [place] [address]   Salicylate level     Status: None   Collection Time: 09/10/17  6:58 PM  Result Value Ref Range   Salicylate Lvl <1.2 2.8 - 30.0 mg/dL    Comment: Performed at [facility], [address] [place], [place] [address]  Acetaminophen level     Status: Abnormal   Collection Time: 09/10/17  6:58 PM  Result Value Ref Range   Acetaminophen (Tylenol), Serum <10 (L) 10 - 30 ug/mL    Comment: (NOTE) Therapeutic concentrations vary significantly. A range of 10-30 ug/mL  may be an effective concentration for many patients. However, some  are best treated at concentrations outside of this range. Acetaminophen concentrations >150 ug/mL at 4 hours after ingestion  and >50 ug/mL at 12 hours after ingestion are often associated with  toxic reactions. Performed at [facility], [address] [place], [place] [address]   cbc     Status: Abnormal   Collection Time: 09/10/17  6:58 PM  Result Value Ref Range   WBC 13.1 (H) 3.6 - 11.0 K/uL   RBC 5.49 (H) 3.80 - 5.20 MIL/uL   Hemoglobin 16.8 (H) 12.0 - 16.0 g/dL   HCT 48.3 (H) 35.0 -  47.0 %    MCV 88.0 80.0 - 100.0 fL   MCH 30.7 26.0 - 34.0 pg   MCHC 34.8 32.0 - 36.0 g/dL   RDW 12.7 11.5 - 14.5 %   Platelets 469 (H) 150 - 440 K/uL    Comment: Performed at [facility], [address] [place], [place] [address]    Current Facility-Administered Medications  Medication Dose Route Frequency Provider Last Rate Last Dose  . aspirin EC tablet 81 mg  81 mg Oral Daily Carrie Mew, MD   81 mg at 09/11/17 1408  . Dapagliflozin-metFORMIN HCl ER 07-998 MG TB24 1 tablet  1 tablet Oral Daily Carrie Mew, MD       Current Outpatient Medications  Medication Sig Dispense Refill  . ALPRAZolam (XANAX) 0.5 MG tablet Take 1 tablet by mouth 2 (two) times daily as needed for anxiety.  3  . aspirin EC 81 MG tablet Take 81 mg by mouth daily.    . Dapagliflozin-Metformin HCl ER (XIGDUO XR) 07-998 MG TB24 Take 1 tablet by mouth 2 (two) times daily.     . DULoxetine (CYMBALTA) 60 MG capsule Take 1 capsule by mouth 2 (two) times daily.    . rosuvastatin (CRESTOR) 5 MG tablet Take 5 mg by mouth daily.  1  . Vitamin D, Ergocalciferol, (DRISDOL) 50000 units CAPS capsule Take 50,000 Units by mouth every Friday.    Marland Kitchen VYVANSE 50 MG capsule Take 50 mg by mouth daily.   0  . zolpidem (AMBIEN) 10 MG tablet Take 1 tablet by mouth at bedtime as needed for sleep.       Musculoskeletal: Strength & Muscle Tone: within normal limits Gait & Station: normal Patient leans: N/A  Psychiatric Specialty Exam: Physical Exam  Nursing note and vitals reviewed. Constitutional: She appears well-developed and well-nourished.  HENT:  Head: Normocephalic and atraumatic.  Eyes: Pupils are equal, round, and reactive to light. Conjunctivae are normal.  Neck: Normal range of motion.  Cardiovascular: Regular rhythm and normal heart sounds.  Respiratory: Effort normal. No respiratory distress.  GI: Soft.  Musculoskeletal: Normal range of motion.  Neurological: She is alert.  Skin: Skin is warm and dry.   Psychiatric: Her affect is blunt. Her speech is delayed. She is slowed. Thought content is paranoid. Cognition and memory are impaired. She expresses impulsivity. She exhibits a depressed mood. She expresses suicidal ideation.    Review of Systems  Constitutional: Negative.   HENT: Negative.   Eyes: Negative.   Respiratory: Negative.   Cardiovascular: Negative.   Gastrointestinal: Negative.   Musculoskeletal: Negative.   Skin: Negative.   Neurological: Negative.   Psychiatric/Behavioral: Positive for depression and suicidal ideas. Negative for hallucinations, memory loss and substance abuse. The patient is nervous/anxious and has insomnia.     Blood pressure (!) 141/87, pulse 88, temperature 98.4 F (36.9 C), temperature source Oral, resp. rate 18, height _0  (1.778 m), weight 127 kg (280 lb), SpO2 98 %.Body mass index is 40.18 kg/m.  General Appearance: Disheveled  Eye Contact:  Fair  Speech:  Slow  Volume:  Decreased  Mood:  Depressed  Affect:  Congruent  Thought Process:  Goal Directed  Orientation:  Full (Time, Place, and Person)  Thought Content:  Logical  Suicidal Thoughts:  Yes.  without intent/plan  Homicidal Thoughts:  No  Memory:  Immediate;   Fair Recent;   Fair Remote;   Fair  Judgement:  Fair  Insight:  Fair  Psychomotor Activity:  Decreased  Concentration:  Concentration: Fair  Recall:  [employer] of Knowledge:  Fair  Language:  Fair  Akathisia:  No  Handed:  Right  AIMS (if indicated):     Assets:  Desire for Improvement Financial Resources/Insurance Housing Resilience Social Support  ADL's:  Intact  Cognition:  WNL  Sleep:        Treatment Plan Summary: Daily contact with patient to assess and evaluate symptoms and progress in treatment, Medication management and Plan 52 year old woman suffering from severe depression and anxiety symptoms who recently has had a turn with increased agitation crying not sleeping well.  Now having suicidal  thoughts.  Patient meets criteria for admission to the hospital.  She is agreeable to voluntary admission.  Reviewed medicines and we will try to get her back on the medicine she is currently taking as best as possible.  EKG will be ordered.  15-minute checks in place.  Disposition: Recommend psychiatric Inpatient admission when medically cleared.  Alethia Berthold, MD 09/11/2017 4:11 PM

## 2017-09-11 NOTE — ED Notes (Signed)
Hourly rounding reveals patient sleeping in room. No complaints, stable, in no acute distress. Q15 minute rounds and monitoring via Security Cameras to continue. 

## 2017-09-11 NOTE — ED Notes (Addendum)
Lunch left at bedside. Pt encouraged to eat but reports poor appetite. She was reminded of the need to provide a urine specimen.

## 2017-09-11 NOTE — Tx Team (Signed)
Initial Treatment Plan 09/11/2017 11:05 PM Bonnie Garcia UMP:[id number]    PATIENT STRESSORS: Financial difficulties Health problems Loss of mother is sick    PATIENT STRENGTHS: Average or above average intelligence Capable of independent living Communication skills Motivation for treatment/growth   PATIENT IDENTIFIED PROBLEMS: Thought of suicide with out a plan    Husband had an open heart surgery    Mother is sick in a assisted living facility    Depression/ Anxeity          DISCHARGE CRITERIA:  Ability to meet basic life and health needs Adequate post-discharge living arrangements Improved stabilization in mood, thinking, and/or behavior Motivation to continue treatment in a less acute level of care  PRELIMINARY DISCHARGE PLAN: Participate in family therapy Return to previous living arrangement Return to previous work or school arrangements  PATIENT/FAMILY INVOLVEMENT: This treatment plan has been presented to and reviewed with the patient, Bonnie Garcia,  The patient  have been given the opportunity to ask questions and make suggestions.  Clemens Catholic, RN 09/11/2017, 11:05 PM

## 2017-09-11 NOTE — BH Assessment (Addendum)
Assessment Note  Bonnie Garcia is an 52 y.o. female to ED via POV. Pt reports to experiencing a recent increase in depressive symptoms which is causing her to have difficulty with managing her day to day tasks of going to work and feeling motivated. Pt reports, " I have thoughts of not wanting to be here. Everything is dark. I've been feeling this way for about two weeks." Pt reports that her husband recently had heart surgery, she's also had to recently become caregiver of her mother, and had to place her father into a rest home. Pt reports to having excessive crying, sweating, and being scare to be alone. Pt reports that no matter where she goes, she is afraid. Pt reports, "It's like everywhere I go, its a haunted house. I'm afraid no matter where I go" Pt denies a suicidal plan, but admits to "just not wanting to be here." Pt currently receiving medication management at [facility] and therapy at [facility].   Pt calm and cooperative at time of assessment. Pt oriented x4, denies HI,AH, and VH.  Diagnosis: Depression  Past Medical History:  Past Medical History:  Diagnosis Date  . ADHD (attention deficit hyperactivity disorder)   . Depression   . Diabetes mellitus without complication ([place])   . Hypertension   . Kidney stones     Past Surgical History:  Procedure Laterality Date  . APPENDECTOMY    . CHOLECYSTECTOMY    . COLONOSCOPY WITH PROPOFOL N/A 07/23/2015   Procedure: COLONOSCOPY WITH PROPOFOL;  Surgeon: Lollie Sails, MD;  Location: [facility] ENDOSCOPY;  Service: Endoscopy;  Laterality: N/A;  . ECTOPIC PREGNANCY SURGERY    . ENDOMETRIAL ABLATION    . ESOPHAGOGASTRODUODENOSCOPY (EGD) WITH PROPOFOL N/A 07/23/2015   Procedure: ESOPHAGOGASTRODUODENOSCOPY (EGD) WITH PROPOFOL;  Surgeon: Lollie Sails, MD;  Location: [facility] ENDOSCOPY;  Service: Endoscopy;  Laterality: N/A;  . TUBAL LIGATION      Family History:  Family History  Problem Relation Age of Onset  .  Breast cancer Paternal Aunt   . Breast cancer Paternal Grandmother   . Diabetes Mother   . Diabetes Father   . Hypertension Father   . Heart disease Father   . Parkinson's disease Father     Social History:  reports that she quit smoking about 10 years ago. She has never used smokeless tobacco. She reports that she drinks alcohol. She reports that she does not use drugs.  Additional Social History:  Alcohol / Drug Use Pain Medications: see PTA Prescriptions: see PTA Over the Counter: see PTA History of alcohol / drug use?: No history of alcohol / drug abuse Longest period of sobriety (when/how long): pt denies use  CIWA: CIWA-Ar BP: (!) 165/105 Pulse Rate: (!) 102 COWS:    Allergies:  Allergies  Allergen Reactions  . Erythromycin Other (See Comments)    Reaction:  GI upset   . Penicillins Hives and Other (See Comments)    Has patient had a PCN reaction causing immediate rash, facial/tongue/throat swelling, SOB or lightheadedness with hypotension: No Has patient had a PCN reaction causing severe rash involving mucus membranes or skin necrosis: No Has patient had a PCN reaction that required hospitalization No Has patient had a PCN reaction occurring within the last 10 years: No If all of the above answers are "NO", then may proceed with Cephalosporin use.    Home Medications:  (Not in a hospital admission)  OB/GYN Status:  No LMP recorded. Patient has had an ablation.  General Assessment Data Location of Assessment: [facility] ED TTS Assessment: In system Is this a Tele or Face-to-Face Assessment?: Face-to-Face Is this an Initial Assessment or a Re-assessment for this encounter?: Initial Assessment Marital status: Married [employer] name: Bonnie Garcia Is patient pregnant?: No Pregnancy Status: No Living Arrangements: Spouse/significant other, Children Can pt return to current living arrangement?: Yes Admission Status: Voluntary Is patient capable of signing voluntary admission?:  Yes Referral Source: Self/Family/Friend Insurance type: Insurance risk surveyor Exam ([place]) Medical Exam completed: Yes  Crisis Care Plan Living Arrangements: Spouse/significant other, Children Legal Guardian: Other:(self) Name of Psychiatrist: [place] Name of Therapist: [facility]  Education Status Is patient currently in school?: No Is the patient employed, unemployed or receiving disability?: Employed  Risk to self with the past 6 months Suicidal Ideation: Yes-Currently Present Has patient been a risk to self within the past 6 months prior to admission? : No Suicidal Intent: No Has patient had any suicidal intent within the past 6 months prior to admission? : No Is patient at risk for suicide?: No, but patient needs Medical Clearance Suicidal Plan?: No Has patient had any suicidal plan within the past 6 months prior to admission? : No Access to Means: Yes Specify Access to Suicidal Means: prescribed medications, sharp objects What has been your use of drugs/alcohol within the last 12 months?: none described Previous Attempts/Gestures: No How many times?: 0 Other Self Harm Risks: none indicated Triggers for Past Attempts: None known Intentional Self Injurious Behavior: None Family Suicide History: Yes(maternal uncle and adopted nephew died by suicide) Recent stressful life event(s): Financial Problems, Recent negative physical changes, Trauma (Comment) Persecutory voices/beliefs?: No Depression: Yes Depression Symptoms: Despondent, Insomnia, Tearfulness, Isolating, Fatigue, Guilt, Loss of interest in usual pleasures, Feeling worthless/self pity Substance abuse history and/or treatment for substance abuse?: No Suicide prevention information given to non-admitted patients: Not applicable  Risk to Others within the past 6 months Homicidal Ideation: No Does patient have any lifetime risk of violence toward others beyond the six months prior to  admission? : No Thoughts of Harm to Others: No Current Homicidal Intent: No Current Homicidal Plan: No Access to Homicidal Means: No Identified Victim: none indicated History of harm to others?: No Assessment of Violence: None Noted Violent Behavior Description: none indicated Does patient have access to weapons?: No Criminal Charges Pending?: No Does patient have a court date: No Is patient on probation?: No  Psychosis Hallucinations: None noted Delusions: None noted  Mental Status Report Appearance/Hygiene: Unremarkable Eye Contact: Good Motor Activity: Freedom of movement Speech: Logical/coherent Level of Consciousness: Alert Mood: Helpless, Preoccupied, Sad Affect: Appropriate to circumstance Anxiety Level: Moderate Thought Processes: Coherent, Relevant Judgement: Unimpaired Orientation: Person, Place, Time, Situation, Appropriate for developmental age Obsessive Compulsive Thoughts/Behaviors: Minimal  Cognitive Functioning Concentration: Decreased Memory: Recent Intact, Remote Intact Is patient IDD: No Is patient DD?: No Insight: Fair Impulse Control: Fair Appetite: Fair Have you had any weight changes? : No Change Sleep: Decreased Total Hours of Sleep: 4 Vegetative Symptoms: Staying in bed  ADLScreening [facility] Assessment Services) Patient's cognitive ability adequate to safely complete daily activities?: Yes Patient able to express need for assistance with ADLs?: Yes Independently performs ADLs?: Yes (appropriate for developmental age)  Prior Inpatient Therapy Prior Inpatient Therapy: No  Prior Outpatient Therapy Prior Outpatient Therapy: No Does patient have an ACCT team?: No Does patient have Intensive In-House Services?  : No Does patient have [facility] services? : No Does patient have P4CC services?: No  ADL Screening (condition at time of admission) Patient's cognitive ability adequate to safely complete daily activities?: Yes Is the patient deaf or  have difficulty hearing?: No Does the patient have difficulty seeing, even when wearing glasses/contacts?: No Does the patient have difficulty concentrating, remembering, or making decisions?: No Patient able to express need for assistance with ADLs?: Yes Does the patient have difficulty dressing or bathing?: No Independently performs ADLs?: Yes (appropriate for developmental age) Does the patient have difficulty walking or climbing stairs?: No Weakness of Legs: None Weakness of Arms/Hands: None  Home Assistive Devices/Equipment Home Assistive Devices/Equipment: None  Therapy Consults (therapy consults require a physician order) PT Evaluation Needed: No OT Evalulation Needed: No SLP Evaluation Needed: No Abuse/Neglect Assessment (Assessment to be complete while patient is alone) Abuse/Neglect Assessment Can Be Completed: Yes Physical Abuse: Denies Verbal Abuse: Denies Sexual Abuse: Denies Exploitation of patient/patient's resources: Denies Self-Neglect: Denies Values / Beliefs Cultural Requests During Hospitalization: None Spiritual Requests During Hospitalization: None Consults Spiritual Care Consult Needed: No Social Work Consult Needed: No Regulatory affairs officer (For Healthcare) Does Patient Have a Medical Advance Directive?: No Would patient like information on creating a medical advance directive?: No - Patient declined    Additional Information 1:1 In Past 12 Months?: No CIRT Risk: No Elopement Risk: No Does patient have medical clearance?: Yes     Disposition:  Disposition Initial Assessment Completed for this Encounter: Yes Disposition of Patient: Admit Type of inpatient treatment program: Adult Patient refused recommended treatment: No Mode of transportation if patient is discharged?: Car Patient referred to: Other (Comment)  On Site Evaluation by:   Reviewed with Physician:    Ethelene Browns, MS, [facility] 09/11/2017 6:30 AM

## 2017-09-11 NOTE — ED Notes (Signed)
Report received from [facility]. Pt resting in bed. Q15 checks and camera monitoring via security cameras in place.

## 2017-09-11 NOTE — ED Notes (Signed)
Bonnie Garcia denies SI, HI, and AVH. "I just don't want to be around people." She was calm/cooperative/pleasant. She was given a diet ginger ale with her breakfast. Pt is now resting in her room.

## 2017-09-11 NOTE — ED Notes (Signed)
Dinner tray left at bedside

## 2017-09-11 NOTE — ED Notes (Signed)
Pt declined Vyvanse because shethinks it may be causing her problems. Also, she normally takes in the a.m and she thought it would keep her awake.

## 2017-09-11 NOTE — ED Provider Notes (Signed)
-----------------------------------------   6:11 AM on 09/11/2017 -----------------------------------------   Blood pressure (!) 165/105, pulse (!) 102, temperature 98.7 F (37.1 C), temperature source Oral, resp. rate 20, height 5\' 10"  (1.778 m), weight 127 kg (280 lb), SpO2 98 %.  The patient had no acute events since last update.  Calm and cooperative at this time.  Disposition is pending Psychiatry/Behavioral Medicine team recommendations.     Paulette Blanch, MD 09/11/17 [phone]

## 2017-09-11 NOTE — ED Notes (Signed)
Breakfast tray left at bedside.

## 2017-09-12 ENCOUNTER — Encounter: Payer: Self-pay | Admitting: Psychiatry

## 2017-09-12 DIAGNOSIS — F332 Major depressive disorder, recurrent severe without psychotic features: Principal | ICD-10-CM

## 2017-09-12 LAB — HEMOGLOBIN A1C
Hgb A1c MFr Bld: 8.1 % — ABNORMAL HIGH (ref 4.8–5.6)
MEAN PLASMA GLUCOSE: 185.77 mg/dL

## 2017-09-12 LAB — LIPID PANEL
CHOL/HDL RATIO: 4.3 ratio
CHOLESTEROL: 201 mg/dL — AB (ref 0–200)
HDL: 47 mg/dL (ref >40)
LDL Cholesterol: 109 mg/dL — ABNORMAL HIGH (ref 0–99)
Triglycerides: 227 mg/dL — ABNORMAL HIGH (ref <150)
VLDL: 45 mg/dL — AB (ref 0–40)

## 2017-09-12 LAB — TSH: TSH: 1.758 u[IU]/mL (ref 0.350–4.500)

## 2017-09-12 MED ORDER — FLUVOXAMINE MALEATE 50 MG PO TABS
50.0000 mg | ORAL_TABLET | Freq: Every day | ORAL | Status: DC
  Administered 2017-09-12: 50 mg via ORAL
  Filled 2017-09-12: qty 1

## 2017-09-12 MED ORDER — DULOXETINE HCL 30 MG PO CPEP
60.0000 mg | ORAL_CAPSULE | Freq: Every day | ORAL | Status: DC
  Administered 2017-09-13 – 2017-09-14 (×2): 60 mg via ORAL
  Filled 2017-09-12 (×2): qty 2

## 2017-09-12 MED ORDER — ALPRAZOLAM 0.5 MG PO TABS: 0.5000 mg | ORAL_TABLET | Freq: Two times a day (BID) | ORAL | Status: DC | PRN

## 2017-09-12 NOTE — BHH Suicide Risk Assessment (Signed)
Bonnie Garcia INPATIENT:  Family/Significant Other Suicide Prevention Education  Suicide Prevention Education:  Education Completed; Bonnie Garcia, Husband, [phone] has been identified by the patient as the family member/significant other with whom the patient will be residing, and identified as the person(s) who will aid the patient in the event of a mental health crisis (suicidal ideations/suicide attempt).  With written consent from the patient, the family member/significant other has been provided the following suicide prevention education, prior to the and/or following the discharge of the patient.  The suicide prevention education provided includes the following:  Suicide risk factors  Suicide prevention and interventions  National Suicide Hotline telephone number  [facility] assessment telephone number  [facility] Emergency Assistance [place] and/or Residential Mobile Crisis Unit telephone number  Request made of family/significant other to:  Remove weapons (e.g., guns, rifles, knives), all items previously/currently identified as safety concern.    Remove drugs/medications (over-the-counter, prescriptions, illicit drugs), all items previously/currently identified as a safety concern.  The family member/significant other verbalizes understanding of the suicide prevention education information provided.  The family member/significant other agrees to remove the items of safety concern listed above.  CSW spoke with the patients husband about the precipitating factors that led to her admission.The patients husband reports that the patient has been having trouble getting out of bed. He says that since school has let out, she has loss motivation. He reports that in December 2018  he had to go to the hospital and get some stints placed in his heart. He also mentions other stressors such as her grandchildren being in ICU for 2 mother, her mother having medical problems  and her father being placed in a rest home. He says that he is sure that she is overwhelmed with everything. He says that they have been having some communication problems at home. He says that she just begin seeing a therapist. He says that in 2008 they lost their adoptive son from suicide and he also lost his mother. He says that is when things begin and that it has progressed during the years. He husband reports that they do not have any guns/weapons in home.   Bonnie Garcia 09/12/2017, 11:06 AM

## 2017-09-12 NOTE — Progress Notes (Signed)
New admit, voluntary committed, for thought of suicide ideation and depression , currently denies any SI/HI/AVH cooperating assessment , skin is clean and search was done by Elwin Sleight / Alex RN  , no contraband found, verbally contract for safety . Patient is appropriate and demonstrate self control , mood is bright , compliant with meds.no issues at this time

## 2017-09-12 NOTE — BHH Group Notes (Signed)
[facility] Group Notes:  (Nursing/MHT/Case Management/Adjunct)  Date:  09/12/2017  Time:  11:01 PM  Type of Therapy:  Group Therapy  Participation Level:  Active  Participation Quality:  Appropriate  Affect:  Appropriate  Cognitive:  Appropriate  Insight:  Appropriate  Engagement in Group:  Engaged  Modes of Intervention:  Discussion  Summary of Progress/Problems: MHT addressed the messes that were left behind and encouraged patients to clean up after themselves. MHT reviewed visiting hours and the number of people allowed on the unit at one time. MHT informed patients of the phone hours. MHT informed patients of the time the day room would be shut down. MHT informed patients of wake-up call at 6am for vitals. MHT answered questions and concerns about the rules and expectations. MHT had patients introduce self and tell group what type of animal they would choose to be and why. MHT processed with group about decision making. MHT reviewed list of things to do when making decisions. MHT encouraged group to utilize the social workers while in group to address concerns and develop plans to address issue when released. MHT informed group they could be linked to providers once discharged. MHT informed group that IPRS funds were available for mental health services. MHT informed group they need to recognize a need exists to participate in treatment and medication management. Bonnie Garcia shared with group. Bonnie Garcia was engaged throughout group. Barnie Mort 09/12/2017, 11:01 PM

## 2017-09-12 NOTE — BHH Suicide Risk Assessment (Signed)
[facility] Admission Suicide Risk Assessment   Nursing information obtained from:  Patient Demographic factors:  Caucasian Current Mental Status:  NA Loss Factors:  NA Historical Factors:  NA Risk Reduction Factors:  Positive therapeutic relationship  Total Time spent with patient: 1 hour Principal Problem: Severe recurrent major depression without psychotic features ([place]) Diagnosis:   Patient Active Problem List   Diagnosis Date Noted  . Severe recurrent major depression without psychotic features ([place]) [F33.2] 09/11/2017    Priority: High  . ADHD (attention deficit hyperactivity disorder) [F90.9] 09/11/2017  . Diabetes mellitus without complication ([place]) [Y85.0] 09/11/2017  . Hypertension [I10] 09/11/2017  . Chest pain [R07.9] 08/03/2015   Subjective Data: suicidal ideation, anxiety, depression  Continued Clinical Symptoms:  Alcohol Use Disorder Identification Test Final Score (AUDIT): 3 The "Alcohol Use Disorders Identification Test", Guidelines for Use in Primary Care, Second Edition.  World Pharmacologist [facility]). Score between 0-7:  no or low risk or alcohol related problems. Score between 8-15:  moderate risk of alcohol related problems. Score between 16-19:  high risk of alcohol related problems. Score 20 or above:  warrants further diagnostic evaluation for alcohol dependence and treatment.   CLINICAL FACTORS:   Severe Anxiety and/or Agitation Depression:   Recent sense of peace/wellbeing Obsessive-Compulsive Disorder   Musculoskeletal: Strength & Muscle Tone: within normal limits Gait & Station: normal Patient leans: N/A  Psychiatric Specialty Exam: Physical Exam  Nursing note and vitals reviewed. Psychiatric: Her speech is normal and behavior is normal. Thought content normal. Her mood appears anxious. Her affect is blunt. Cognition and memory are normal. She expresses impulsivity. She exhibits a depressed mood.    Review of Systems  Neurological: Negative.    Psychiatric/Behavioral: The patient is nervous/anxious.   All other systems reviewed and are negative.   Blood pressure (!) 151/91, pulse 88, temperature 98.4 F (36.9 C), temperature source Oral, resp. rate 20, height 5\' 10"  (1.778 m), weight 122.5 kg (270 lb), SpO2 99 %.Body mass index is 38.74 kg/m.  General Appearance: Casual  Eye Contact:  Good  Speech:  Clear and Coherent  Volume:  Normal  Mood:  Anxious and Depressed  Affect:  Congruent  Thought Process:  Goal Directed and Descriptions of Associations: Intact  Orientation:  Full (Time, Place, and Person)  Thought Content:  WDL  Suicidal Thoughts:  Yes.  without intent/plan  Homicidal Thoughts:  No  Memory:  Immediate;   Fair Recent;   Fair Remote;   Fair  Judgement:  Fair  Insight:  Fair  Psychomotor Activity:  Decreased  Concentration:  Concentration: Fair and Attention Span: Fair  Recall:  [employer] of Knowledge:  Fair  Language:  Fair  Akathisia:  No  Handed:  Right  AIMS (if indicated):     Assets:  Communication Skills Desire for Improvement Financial Resources/Insurance Housing Intimacy Physical Health Resilience Social Support Transportation Vocational/Educational  ADL's:  Intact  Cognition:  WNL  Sleep:         COGNITIVE FEATURES THAT CONTRIBUTE TO RISK:  Closed-mindedness    SUICIDE RISK:   Minimal: No identifiable suicidal ideation.  Patients presenting with no risk factors but with morbid ruminations; may be classified as minimal risk based on the severity of the depressive symptoms  PLAN OF CARE: hospital admission, medication management, discharge planning.  Bonnie Garcia is a 52 year old female with a history of severe depression and anxiety admitted for worsening of her symptoms and suicidal ideation.  #Suicidal ideation -patient able to contract for  safety in the hospital  #Mood -continue Cymbalta 60 mg BID -Trazodone 100 mg nightly  #Anxiety -Vistaril PRN -Xanax 0.5 mg TID  PRN  #ADHD -patient discontinue Vyvanse feeling that it causes anxiety  #DM -Metformin 1000 mg BID -SSI, ADA diet CBG -Crestor 5 mg daily -ASA 81 mg daily  #Labs -lipid panel, TSH, A1C -EKG -pregnancy test  #Disposition -discharge with husband -folow up with Dr. Kasandra Knudsen at [facility]   I certify that inpatient services furnished can reasonably be expected to improve the patient's condition.   Orson Slick, MD 09/12/2017, 1:49 PM
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
## 2017-09-12 NOTE — H&P (Addendum)
Psychiatric Admission Assessment Adult  Patient Identification: Bonnie Garcia MRN:  [id number] Date of Evaluation:  09/12/2017 Chief Complaint:  DEPRESSION Principal Diagnosis: Severe recurrent major depression without psychotic features ([place]) Diagnosis:   Patient Active Problem List   Diagnosis Date Noted  . Severe recurrent major depression without psychotic features ([place]) [F33.2] 09/11/2017    Priority: High  . ADHD (attention deficit hyperactivity disorder) [F90.9] 09/11/2017  . Diabetes mellitus without complication ([place]) [E52.7] 09/11/2017  . Hypertension [I10] 09/11/2017  . Chest pain [R07.9] 08/03/2015   History of Present Illness:   Identifying data. Bonnie Garcia is a 52 year old female with a history of depression and anxiety.  Chief complaint. "I felt strange and jittery."  History of present illness. Information was obtained from the patient and the chart. The patient came to the ER complaining of worsening of depression, extreme anxiety, crying spells and feeling jittery for the past two weeks. She has not been able to get out of bed or take care of the household. She does not want to interact with others and became fearful, not able to even leave her room without a schaperone. Her anxiety heightened with several panic attacks each day, social anxiety and excessive worries. There are no PTSD or OCD symptoms but the patient is a hoarder keeping toys, clothes and handbags. She was not overtly suicidal but went into a "dark place" and wanted to livr no more. There were several recent stressors, husband's surgery, placement of her father in a nursing home, hardship of carrying for her mother.   Past psychiatric history. Lifelong history of depression that worsened 6 years ago. She has been working with Dr. Kasandra Knudsen for 3 years on Cymbalta and Adderral/Vyanse. There were several medication adjustments recently without improvement. She stopped Vyvanse feeling that it contributed to her  anxiety. She was never hospitalized. No suicide attempts. She was tried on Zoloft, Effexor, Wellbutrin, Abilify, Rexulti.   Family psychiatric history. Father with depression.  Social. She lives with her husband. She works as a Control and instrumentation engineer, on vacation now.   Total Time spent with patient: 1 hour  Is the patient at risk to self? Yes.    Has the patient been a risk to self in the past 6 months? No.  Has the patient been a risk to self within the distant past? No.  Is the patient a risk to others? No.  Has the patient been a risk to others in the past 6 months? No.  Has the patient been a risk to others within the distant past? No.   Prior Inpatient Therapy:   Prior Outpatient Therapy:    Alcohol Screening: 1. How often do you have a drink containing alcohol?: Never 2. How many drinks containing alcohol do you have on a typical day when you are drinking?: 3 or 4 3. How often do you have six or more drinks on one occasion?: Less than monthly AUDIT-C Score: 2 4. How often during the last year have you found that you were not able to stop drinking once you had started?: Never 5. How often during the last year have you failed to do what was normally expected from you becasue of drinking?: Less than monthly 6. How often during the last year have you needed a first drink in the morning to get yourself going after a heavy drinking session?: Never 7. How often during the last year have you had a feeling of guilt of remorse after drinking?: Never 8. How often during  the last year have you been unable to remember what happened the night before because you had been drinking?: Never 9. Have you or someone else been injured as a result of your drinking?: No 10. Has a relative or friend or a doctor or another health worker been concerned about your drinking or suggested you cut down?: No Alcohol Use Disorder Identification Test Final Score (AUDIT): 3 Intervention/Follow-up: Alcohol  Education Substance Abuse History in the last 12 months:  No. Consequences of Substance Abuse: NA Previous Psychotropic Medications: Yes  Psychological Evaluations: No  Past Medical History:  Past Medical History:  Diagnosis Date  . ADHD (attention deficit hyperactivity disorder)   . Depression   . Diabetes mellitus without complication ([place])   . Hypertension   . Kidney stones     Past Surgical History:  Procedure Laterality Date  . APPENDECTOMY    . CHOLECYSTECTOMY    . COLONOSCOPY WITH PROPOFOL N/A 07/23/2015   Procedure: COLONOSCOPY WITH PROPOFOL;  Surgeon: Lollie Sails, MD;  Location: [facility] ENDOSCOPY;  Service: Endoscopy;  Laterality: N/A;  . ECTOPIC PREGNANCY SURGERY    . ENDOMETRIAL ABLATION    . ESOPHAGOGASTRODUODENOSCOPY (EGD) WITH PROPOFOL N/A 07/23/2015   Procedure: ESOPHAGOGASTRODUODENOSCOPY (EGD) WITH PROPOFOL;  Surgeon: Lollie Sails, MD;  Location: [facility] ENDOSCOPY;  Service: Endoscopy;  Laterality: N/A;  . TUBAL LIGATION     Family History:  Family History  Problem Relation Age of Onset  . Breast cancer Paternal Aunt   . Breast cancer Paternal Grandmother   . Diabetes Mother   . Diabetes Father   . Hypertension Father   . Heart disease Father   . Parkinson's disease Father     Tobacco Screening: Have you used any form of tobacco in the last 30 days? (Cigarettes, Smokeless Tobacco, Cigars, and/or Pipes): No Social History:  Social History   Substance and Sexual Activity  Alcohol Use Yes  . Alcohol/week: 0.0 oz   Comment: socially     Social History   Substance and Sexual Activity  Drug Use No    Additional Social History: Marital status: Married Number of Years Married: 40 What types of issues is patient dealing with in the relationship?: Pt. reprots that her husband has been having some medical issues and her daughter is going through some things that has cause her to rely on them more Are you sexually active?: Yes What is your sexual  orientation?: Heterosexual Has your sexual activity been affected by drugs, alcohol, medication, or emotional stress?: No Does patient have children?: Yes How many children?: 3 How is patient's relationship with their children?: 1 daughter- pt reports that her daughter has been relying on her alot more lately and it has been overwhelming. son-good relationship, son who passed away                         Allergies:   Allergies  Allergen Reactions  . Erythromycin Other (See Comments)    Reaction:  GI upset   . Penicillins Hives and Other (See Comments)    Has patient had a PCN reaction causing immediate rash, facial/tongue/throat swelling, SOB or lightheadedness with hypotension: No Has patient had a PCN reaction causing severe rash involving mucus membranes or skin necrosis: No Has patient had a PCN reaction that required hospitalization No Has patient had a PCN reaction occurring within the last 10 years: No If all of the above answers are "NO", then may proceed with Cephalosporin use.  Lab Results:  Results for orders placed or performed during the hospital encounter of 09/11/17 (from the past 48 hour(s))  Hemoglobin A1c     Status: Abnormal   Collection Time: 09/10/17  6:58 PM  Result Value Ref Range   Hgb A1c MFr Bld 8.1 (H) 4.8 - 5.6 %    Comment: (NOTE) Pre diabetes:          5.7%-6.4% Diabetes:              >6.4% Glycemic control for   <7.0% adults with diabetes    Mean Plasma Glucose 185.77 mg/dL    Comment: Performed at [facility] [address] [place], [place] [address]  Lipid panel     Status: Abnormal   Collection Time: 09/10/17  6:58 PM  Result Value Ref Range   Cholesterol 201 (H) 0 - 200 mg/dL   Triglycerides 227 (H) <150 mg/dL   HDL 47 >40 mg/dL   Total CHOL/HDL Ratio 4.3 RATIO   VLDL 45 (H) 0 - 40 mg/dL   LDL Cholesterol 109 (H) 0 - 99 mg/dL    Comment:        Total Cholesterol/HDL:CHD Risk Coronary Heart Disease Risk Table                      Men   Women  1/2 Average Risk   3.4   3.3  Average Risk       5.0   4.4  2 X Average Risk   9.6   7.1  3 X Average Risk  23.4   11.0        Use the calculated Patient Ratio above and the CHD Risk Table to determine the patient's CHD Risk.        ATP III CLASSIFICATION (LDL):  <100     mg/dL   Optimal  100-129  mg/dL   Near or Above                    Optimal  130-159  mg/dL   Borderline  160-189  mg/dL   High  >190     mg/dL   Very High Performed at [facility], [address] [place], [place] [address]   TSH     Status: None   Collection Time: 09/10/17  6:58 PM  Result Value Ref Range   TSH 1.758 0.350 - 4.500 uIU/mL    Comment: Performed by a 3rd Generation assay with a functional sensitivity of <=0.01 uIU/mL. Performed at [facility], [address] [place], [place] [address]     Blood Alcohol level:  Lab Results  Component Value Date   [facility] <10 16/38/4665    Metabolic Disorder Labs:  Lab Results  Component Value Date   HGBA1C 8.1 (H) 09/10/2017   MPG 185.77 09/10/2017   No results found for: PROLACTIN Lab Results  Component Value Date   CHOL 201 (H) 09/10/2017   TRIG 227 (H) 09/10/2017   HDL 47 09/10/2017   CHOLHDL 4.3 09/10/2017   VLDL 45 (H) 09/10/2017   LDLCALC 109 (H) 09/10/2017   LDLCALC UNABLE TO CALCULATE IF TRIGLYCERIDE OVER 400 mg/dL 08/04/2015    Current Medications: Current Facility-Administered Medications  Medication Dose Route Frequency Provider Last Rate Last Dose  . acetaminophen (TYLENOL) tablet 650 mg  650 mg Oral Q6H PRN Clapacs, Madie Reno, MD   650 mg at 09/11/17 2212  . ALPRAZolam (XANAX) tablet 0.5 mg  0.5 mg Oral TID PRN Clapacs, John T, MD      . alum & mag hydroxide-simeth (MAALOX/MYLANTA) 200-200-20 MG/5ML suspension 30 mL  30 mL Oral Q4H PRN Clapacs, John T, MD      . aspirin EC tablet 81 mg  81 mg Oral Daily Clapacs, Madie Reno, MD   81 mg at 09/12/17 0829  . DULoxetine (CYMBALTA) DR capsule 60 mg  60  mg Oral BID Clapacs, John T, MD   60 mg at 09/12/17 6270  . hydrOXYzine (ATARAX/VISTARIL) tablet 25 mg  25 mg Oral TID PRN Clapacs, John T, MD      . insulin aspart (novoLOG) injection 0-9 Units  0-9 Units Subcutaneous TID WC Clapacs, Madie Reno, MD   5 Units at 09/12/17 1141  . lisdexamfetamine (VYVANSE) capsule 50 mg  50 mg Oral Daily Clapacs, John T, MD      . magnesium hydroxide (MILK OF MAGNESIA) suspension 30 mL  30 mL Oral Daily PRN Clapacs, John T, MD      . metFORMIN (GLUCOPHAGE-XR) 24 hr tablet 1,000 mg  1,000 mg Oral Q breakfast Clapacs, Madie Reno, MD   1,000 mg at 09/12/17 0829  . rosuvastatin (CRESTOR) tablet 5 mg  5 mg Oral q1800 Clapacs, John T, MD      . traZODone (DESYREL) tablet 100 mg  100 mg Oral QHS PRN Clapacs, Madie Reno, MD   100 mg at 09/11/17 2212   PTA Medications: Medications Prior to Admission  Medication Sig Dispense Refill Last Dose  . ALPRAZolam (XANAX) 0.5 MG tablet Take 1 tablet by mouth 2 (two) times daily as needed for anxiety.  3 PRN at PRN  . aspirin EC 81 MG tablet Take 81 mg by mouth daily.     . Dapagliflozin-Metformin HCl ER (XIGDUO XR) 07-998 MG TB24 Take 1 tablet by mouth 2 (two) times daily.    09/10/2017 at 0800  . DULoxetine (CYMBALTA) 60 MG capsule Take 1 capsule by mouth 2 (two) times daily.   09/10/2017 at 0800  . rosuvastatin (CRESTOR) 5 MG tablet Take 5 mg by mouth daily.  1 09/09/2017 at 1900  . Vitamin D, Ergocalciferol, (DRISDOL) 50000 units CAPS capsule Take 50,000 Units by mouth every Friday.   09/04/2017 at 0800  . VYVANSE 50 MG capsule Take 50 mg by mouth daily.   0 09/10/2017 at 0800  . zolpidem (AMBIEN) 10 MG tablet Take 1 tablet by mouth at bedtime as needed for sleep.    PRN at PRN    Musculoskeletal: Strength & Muscle Tone: within normal limits Gait & Station: normal Patient leans: N/A  Psychiatric Specialty Exam: Physical Exam  Nursing note and vitals reviewed. Constitutional: She is oriented to person, place, and time. She appears  well-developed and well-nourished.  HENT:  Head: Normocephalic and atraumatic.  Eyes: Pupils are equal, round, and reactive to light. Conjunctivae and EOM are normal.  Neck: Normal range of motion. Neck supple.  Cardiovascular: Normal rate, regular rhythm and normal heart sounds.  Respiratory: Effort normal and breath sounds normal.  GI: Soft. Bowel sounds are normal.  Musculoskeletal: Normal range of motion.  Neurological: She is alert and oriented to person, place, and time.  Skin: Skin is warm and dry.    Review of Systems  Neurological: Negative.   Psychiatric/Behavioral: Positive for depression. The patient is nervous/anxious.   All other systems reviewed and are negative.   Blood pressure (!) 151/91, pulse 88, temperature 98.4 F (36.9 C), temperature source Oral,  resp. rate 20, height 5\' 10"  (1.778 m), weight 122.5 kg (270 lb), SpO2 99 %.Body mass index is 38.74 kg/m.  See SRA                                                  Sleep:       Treatment Plan Summary: Daily contact with patient to assess and evaluate symptoms and progress in treatment and Medication management   Ms. Tolleson is a 52 year old female with a history of severe depression and anxiety admitted for worsening of her symptoms and suicidal ideation.  #Suicidal ideation -patient able to contract for safety in the hospital  #Mood -lower Cymbalta to 60 mg daily -Trazodone 100 mg nightly -offer Luvox 50 mg nightly   #Anxiety -Vistaril PRN -Xanax 0.5 mg TID PRN  #ADHD -patient discontinue Vyvanse feeling that it causes anxiety  #DM -Metformin 1000 mg BID -SSI, ADA diet CBG -Crestor 5 mg daily -ASA 81 mg daily  #Labs -lipid panel, TSH, A1C -EKG -pregnancy test  #Disposition -discharge with the husband -follow up with Dr. Kasandra Knudsen and her therapist   Observation Level/Precautions:  15 minute checks  Laboratory:  CBC Chemistry Profile UDS UA  Psychotherapy:     Medications:    Consultations:    Discharge Concerns:    Estimated LOS:  Other:     Physician Treatment Plan for Primary Diagnosis: Severe recurrent major depression without psychotic features ([place]) Long Term Goal(s): Improvement in symptoms so as ready for discharge  Short Term Goals: Ability to identify changes in lifestyle to reduce recurrence of condition will improve, Ability to verbalize feelings will improve, Ability to disclose and discuss suicidal ideas, Ability to demonstrate self-control will improve, Ability to identify and develop effective coping behaviors will improve, Ability to maintain clinical measurements within normal limits will improve, Compliance with prescribed medications will improve and Ability to identify triggers associated with substance abuse/mental health issues will improve  Physician Treatment Plan for Secondary Diagnosis: Principal Problem:   Severe recurrent major depression without psychotic features ([place]) Active Problems:   ADHD (attention deficit hyperactivity disorder)   Diabetes mellitus without complication ([place])   Hypertension  Long Term Goal(s): NA  Short Term Goals: NA  I certify that inpatient services furnished can reasonably be expected to improve the patient's condition.    Orson Slick, MD 7/13/20192:11 PM

## 2017-09-12 NOTE — Plan of Care (Signed)
Pleasant and cooperative.  Rates depression as 2/10.  Rates anxiety as 9/10.  Denies SI/HI/AVH.  Medication and group compliant.  Interacting appropriately with staff and peers.  Support and encouragement offered.  Safety maintained.

## 2017-09-12 NOTE — BHH Group Notes (Signed)
Date/Time:  09/12/2017 1PM   Type of Therapy and Topic:  Group Therapy:  Healthy and Unhealthy Supports   Participation Level:  Active    Description of Group:  Patients in this group were introduced to the idea of adding a variety of healthy supports to address the various needs in their lives.Patients discussed what additional healthy supports could be helpful in their recovery and wellness after discharge in order to prevent future hospitalizations.   An emphasis was placed on using counselor, doctor, therapy groups, 12-step groups, and problem-specific support groups to expand supports.  They also worked as a group on developing a specific plan for several patients to deal with unhealthy supports through [place], psychoeducation with loved ones, and even termination of relationships.    Therapeutic Goals:               1)  discuss importance of adding supports to stay well once out of the hospital             2)  compare healthy versus unhealthy supports and identify some examples of each             3)  generate ideas and descriptions of healthy supports that can be added             4)  offer mutual support about how to address unhealthy supports             5)  encourage active participation in and adherence to discharge plan                Summary of Patient Progress:  The patient stated that current healthy supports in her life are God and her spouse while current unhealthy supports include being in her bed sad.  The patient expressed a willingness to add going to church as support(s) to help in her recovery journey.     Therapeutic Modalities:   Motivational Interviewing Brief Solution-Focused Therapy   Darin Engels, Marlinda Mike 09/12/2017 2:23 PM

## 2017-09-13 MED ORDER — TRAZODONE HCL 100 MG PO TABS
100.0000 mg | ORAL_TABLET | Freq: Every day | ORAL | Status: DC
  Administered 2017-09-13: 100 mg via ORAL
  Filled 2017-09-13: qty 1

## 2017-09-13 MED ORDER — TOPIRAMATE 100 MG PO TABS: 100.0000 mg | ORAL_TABLET | Freq: Every day | ORAL | Status: DC

## 2017-09-13 MED ORDER — TOPIRAMATE 100 MG PO TABS
100.0000 mg | ORAL_TABLET | Freq: Every day | ORAL | Status: DC
  Administered 2017-09-13: 100 mg via ORAL
  Filled 2017-09-13 (×2): qty 1

## 2017-09-13 MED ORDER — FLUVOXAMINE MALEATE 50 MG PO TABS
100.0000 mg | ORAL_TABLET | Freq: Every day | ORAL | Status: DC
  Administered 2017-09-13: 100 mg via ORAL
  Filled 2017-09-13: qty 2

## 2017-09-13 NOTE — Progress Notes (Signed)
[facility] Group Notes:  (Nursing/MHT/Case Management/Adjunct)  Date:  09/13/2017  Time:  9:50 PM  Type of Therapy:  wrap up group  Participation Level:  Active  Participation Quality:  Appropriate and Sharing  Affect:  Appropriate  Cognitive:  Alert and Appropriate  Insight:  Appropriate and Good  Engagement in Group:  Engaged  Modes of Intervention:  Discussion  Summary of Progress/Problems: Bonnie Garcia shared with the group how she was scared due to the unknown.  She did not know what to expect. She has now met others who welcomed her and she feels much better. She is eager to continue her treatment process.  Bonnie Garcia [place] 09/13/2017, 9:50 PM

## 2017-09-13 NOTE — Plan of Care (Signed)
Pt. Participates in unit activities and groups. Pt. Compliant with medications. Pt. Denies SI/HI. Pt. Verbally is able to contract for safety. Pt. Verbalizes understanding of education provided. Pt. Denies pain.    Problem: Activity: Goal: Interest or engagement in leisure activities will improve Outcome: Progressing   Problem: Health Behavior/Discharge Planning: Goal: Compliance with therapeutic regimen will improve Outcome: Progressing   Problem: Safety: Goal: Ability to disclose and discuss suicidal ideas will improve Outcome: Progressing   Problem: Education: Goal: Knowledge of General Education information will improve Outcome: Progressing   Problem: Pain Managment: Goal: General experience of comfort will improve Outcome: Progressing   Problem: Safety: Goal: Ability to remain free from injury will improve Outcome: Progressing

## 2017-09-13 NOTE — Progress Notes (Signed)
Received Bonnie Garcia this AM in her room, She ate breakfast and received here medications. She denied all of the psychiatric symptoms this AM and rated depression and anxiety 2/10 on her self inventory form.

## 2017-09-13 NOTE — Progress Notes (Signed)
[facility] MD Progress Note  09/13/2017 8:25 AM Bonnie Garcia  MRN:  [id number]  Subjective:    Bonnie Garcia feels less anxious and not at all suicidal but very tired. She slept 2 hours only last night. She is unable to identify and reason. She would like to be home ASAP. She could not tolerate Abilify or Rexulti that dysregulated her diabetes. Agrees to try Topamax. She tolerates Luvox well.   Principal Problem: Severe recurrent major depression without psychotic features ([place]) Diagnosis:   Patient Active Problem List   Diagnosis Date Noted  . Severe recurrent major depression without psychotic features ([place]) [F33.2] 09/11/2017    Priority: High  . ADHD (attention deficit hyperactivity disorder) [F90.9] 09/11/2017  . Diabetes mellitus without complication ([place]) [U76.5] 09/11/2017  . Hypertension [I10] 09/11/2017  . Chest pain [R07.9] 08/03/2015   Total Time spent with patient: 20 minutes  Past Psychiatric History: depression, anxiety  Past Medical History:  Past Medical History:  Diagnosis Date  . ADHD (attention deficit hyperactivity disorder)   . Depression   . Diabetes mellitus without complication ([place])   . Hypertension   . Kidney stones     Past Surgical History:  Procedure Laterality Date  . APPENDECTOMY    . CHOLECYSTECTOMY    . COLONOSCOPY WITH PROPOFOL N/A 07/23/2015   Procedure: COLONOSCOPY WITH PROPOFOL;  Surgeon: Lollie Sails, MD;  Location: [facility] ENDOSCOPY;  Service: Endoscopy;  Laterality: N/A;  . ECTOPIC PREGNANCY SURGERY    . ENDOMETRIAL ABLATION    . ESOPHAGOGASTRODUODENOSCOPY (EGD) WITH PROPOFOL N/A 07/23/2015   Procedure: ESOPHAGOGASTRODUODENOSCOPY (EGD) WITH PROPOFOL;  Surgeon: Lollie Sails, MD;  Location: [facility] ENDOSCOPY;  Service: Endoscopy;  Laterality: N/A;  . TUBAL LIGATION     Family History:  Family History  Problem Relation Age of Onset  . Breast cancer Paternal Aunt   . Breast cancer Paternal Grandmother   . Diabetes Mother   .  Diabetes Father   . Hypertension Father   . Heart disease Father   . Parkinson's disease Father    Family Psychiatric  History: father with depression Social History:  Social History   Substance and Sexual Activity  Alcohol Use Yes  . Alcohol/week: 0.0 oz   Comment: socially     Social History   Substance and Sexual Activity  Drug Use No    Social History   Socioeconomic History  . Marital status: Married    Spouse name: Not on file  . Number of children: Not on file  . Years of education: Not on file  . Highest education level: Not on file  Occupational History  . Not on file  Social Needs  . Financial resource strain: Not on file  . Food insecurity:    Worry: Not on file    Inability: Not on file  . Transportation needs:    Medical: Not on file    Non-medical: Not on file  Tobacco Use  . Smoking status: Former Smoker    Last attempt to quit: 2009    Years since quitting: 10.5  . Smokeless tobacco: Never Used  Substance and Sexual Activity  . Alcohol use: Yes    Alcohol/week: 0.0 oz    Comment: socially  . Drug use: No  . Sexual activity: Not on file  Lifestyle  . Physical activity:    Days per week: Not on file    Minutes per session: Not on file  . Stress: Not on file  Relationships  . Social  connections:    Talks on phone: Not on file    Gets together: Not on file    Attends religious service: Not on file    Active member of club or organization: Not on file    Attends meetings of clubs or organizations: Not on file    Relationship status: Not on file  Other Topics Concern  . Not on file  Social History Narrative  . Not on file   Additional Social History:                         Sleep: Poor  Appetite:  Fair  Current Medications: Current Facility-Administered Medications  Medication Dose Route Frequency Provider Last Rate Last Dose  . acetaminophen (TYLENOL) tablet 650 mg  650 mg Oral Q6H PRN Clapacs, Madie Reno, MD   650 mg at  09/11/17 2212  . ALPRAZolam (XANAX) tablet 0.5 mg  0.5 mg Oral BID PRN Davonda Ausley B, MD      . alum & mag hydroxide-simeth (MAALOX/MYLANTA) 200-200-20 MG/5ML suspension 30 mL  30 mL Oral Q4H PRN Clapacs, John T, MD      . aspirin EC tablet 81 mg  81 mg Oral Daily Clapacs, Madie Reno, MD   81 mg at 09/12/17 0829  . DULoxetine (CYMBALTA) DR capsule 60 mg  60 mg Oral Daily Ryaan Vanwagoner B, MD      . fluvoxaMINE (LUVOX) tablet 50 mg  50 mg Oral QHS Elmin Wiederholt B, MD   50 mg at 09/12/17 2133  . hydrOXYzine (ATARAX/VISTARIL) tablet 25 mg  25 mg Oral TID PRN Clapacs, John T, MD      . insulin aspart (novoLOG) injection 0-9 Units  0-9 Units Subcutaneous TID WC Clapacs, Madie Reno, MD   2 Units at 09/12/17 1657  . magnesium hydroxide (MILK OF MAGNESIA) suspension 30 mL  30 mL Oral Daily PRN Clapacs, John T, MD      . metFORMIN (GLUCOPHAGE-XR) 24 hr tablet 1,000 mg  1,000 mg Oral Q breakfast Clapacs, Madie Reno, MD   1,000 mg at 09/12/17 0829  . rosuvastatin (CRESTOR) tablet 5 mg  5 mg Oral q1800 Clapacs, Madie Reno, MD   5 mg at 09/12/17 1656  . traZODone (DESYREL) tablet 100 mg  100 mg Oral QHS PRN Clapacs, Madie Reno, MD   100 mg at 09/11/17 2212    Lab Results:  Results for orders placed or performed during the hospital encounter of 09/10/17 (from the past 48 hour(s))  Glucose, capillary     Status: Abnormal   Collection Time: 09/11/17  5:03 PM  Result Value Ref Range   Glucose-Capillary 243 (H) 70 - 99 mg/dL    Blood Alcohol level:  Lab Results  Component Value Date   ETH <10 40/98/1191    Metabolic Disorder Labs: Lab Results  Component Value Date   HGBA1C 8.1 (H) 09/10/2017   MPG 185.77 09/10/2017   No results found for: PROLACTIN Lab Results  Component Value Date   CHOL 201 (H) 09/10/2017   TRIG 227 (H) 09/10/2017   HDL 47 09/10/2017   CHOLHDL 4.3 09/10/2017   VLDL 45 (H) 09/10/2017   LDLCALC 109 (H) 09/10/2017   LDLCALC UNABLE TO CALCULATE IF TRIGLYCERIDE OVER 400 mg/dL  08/04/2015    Physical Findings: AIMS: Facial and Oral Movements Muscles of Facial Expression: None, normal Lips and Perioral Area: None, normal Jaw: None, normal Tongue: None, normal,Extremity Movements Upper (arms, wrists, hands, fingers): None,  normal Lower (legs, knees, ankles, toes): None, normal, Trunk Movements Neck, shoulders, hips: None, normal, Overall Severity Severity of abnormal movements (highest score from questions above): None, normal Incapacitation due to abnormal movements: None, normal Patient's awareness of abnormal movements (rate only patient's report): No Awareness, Dental Status Current problems with teeth and/or dentures?: No Does patient usually wear dentures?: No  CIWA:  CIWA-Ar Total: 2 COWS:  COWS Total Score: 3  Musculoskeletal: Strength & Muscle Tone: within normal limits Gait & Station: normal Patient leans: N/A  Psychiatric Specialty Exam: Physical Exam  Nursing note and vitals reviewed. Psychiatric: Her speech is normal. Judgment and thought content normal. Her mood appears anxious. Her affect is blunt. She is slowed and withdrawn. Cognition and memory are normal. She exhibits a depressed mood.    Review of Systems  Neurological: Negative.   Psychiatric/Behavioral: Positive for depression. The patient is nervous/anxious.   All other systems reviewed and are negative.   Blood pressure 123/90, pulse (!) 104, temperature 98.2 F (36.8 C), temperature source Oral, resp. rate 18, height 5\' 10"  (1.778 m), weight 122.5 kg (270 lb), SpO2 98 %.Body mass index is 38.74 kg/m.  General Appearance: Casual  Eye Contact:  Good  Speech:  Clear and Coherent  Volume:  Normal  Mood:  Anxious  Affect:  Congruent  Thought Process:  Goal Directed and Descriptions of Associations: Intact  Orientation:  Full (Time, Place, and Person)  Thought Content:  WDL  Suicidal Thoughts:  No  Homicidal Thoughts:  No  Memory:  Immediate;   Fair Recent;   Fair Remote;    Fair  Judgement:  Fair  Insight:  Fair  Psychomotor Activity:  Normal  Concentration:  Concentration: Fair and Attention Span: Fair  Recall:  [employer] of Knowledge:  Fair  Language:  Fair  Akathisia:  No  Handed:  Right  AIMS (if indicated):     Assets:  Communication Skills Desire for Improvement Financial Resources/Insurance Housing Intimacy Physical Health Resilience Social Support Transportation Vocational/Educational  ADL's:  Intact  Cognition:  WNL  Sleep:  Number of Hours: 2.15     Treatment Plan Summary: Daily contact with patient to assess and evaluate symptoms and progress in treatment and Medication management   s. Weingartner is a 52 year old female with a history of severe depression and anxiety admitted for worsening of her symptoms and suicidal ideation.   #Suicidal ideation, resolved -patient able to contract for safety in the hospital  #Mood. improved continue lower dose of Cymbalta 60 mg daily -change Trazodone to a standing dose, 100 mg nightly -increase Luvox to 100 mg nightly  -start Topamax 100 mg for mood stabilization   #Anxiety -Vistaril PRN -Xanax 0.5 mg BID PRN  #ADHD -patient discontinued Vyvanse feeling that it causes anxiety  #DM -Metformin 1000 mg BID -SSI, ADA diet CBG -Crestor 5 mg daily -ASA 81 mg daily  #Labs -lipid panel shows elevated TG, TSH is normal, A1C 8.1 -EKG reviewed, NSR with QTc 466 -pregnancy test  #Disposition -discharge with the husband tomorrow -follow up with Dr. Kasandra Knudsen and her therapist   Orson Slick, MD 09/13/2017, 8:25 AM

## 2017-09-13 NOTE — [facility] Group Notes (Signed)
LCSW Group Therapy Note   09/13/2017 1:15pm   Type of Therapy and Topic:  Group Therapy:  Positive Affirmations   Participation Level:  Active  Description of Group: This group addressed positive affirmation toward self and others. Patients went around the room and identified two positive things about themselves and two positive things about a peer in the room. Patients reflected on how it felt to share something positive with others, to identify positive things about themselves, and to hear positive things from others. Patients were encouraged to have a daily reflection of positive characteristics or circumstances.  Therapeutic Goals 1. Patient will verbalize two of their positive qualities 2. Patient will demonstrate empathy for others by stating two positive qualities about a peer in the group 3. Patient will verbalize their feelings when voicing positive self affirmations and when voicing positive affirmations of others 4. Patients will discuss the potential positive impact on their wellness/recovery of focusing on positive traits of self and others. Summary of Patient Progress:  Stayed the entire time, engaged throughout.  "I came in because I needed a change.  I was crying all the time and isolating at home.  This was the only way I knew to get some help to stop the downward spiral."  States she is putting together a plan while here as to how to structure herself to get out of the house more.  Gave support and encouragement to other patients.  Therapeutic Modalities Cognitive Behavioral Therapy Motivational Interviewing  Trish Mage, [place] 09/13/2017 3:21 PM

## 2017-09-13 NOTE — Progress Notes (Signed)
D: Pt denies SI/HI/AVH. Pt is pleasant and cooperative. Pt. reports anxiety and depression "7/10" for both, but denies need for PRN medications for comforts. Patient interaction appropriate. Pt. Attends snacks and groups. Pt. Frequently observed socializing in the milieu with peers and staff. Pt. Reports doing better today.   A: Q x 15 minute observation checks were completed for safety. Patient was provided with education.  Patient was given scheduled medications. Patient  was encourage to attend groups, participate in unit activities and continue with plan of care. Pt. Chart and plans of care reviewed. Pt. Given support and encouragement.   R: Patient is complaint with medication and unit procedures. Pt. Blood sugars monitored per MD orders.            Precautionary checks every 15 minutes for safety maintained, room free of safety hazards, patient sustains no injury or falls during this shift.

## 2017-09-14 LAB — GLUCOSE, CAPILLARY
GLUCOSE-CAPILLARY: 210 mg/dL — AB (ref 70–99)
GLUCOSE-CAPILLARY: 164 mg/dL — AB (ref 70–99)
GLUCOSE-CAPILLARY: 286 mg/dL — AB (ref 70–99)
GLUCOSE-CAPILLARY: 195 mg/dL — AB (ref 70–99)
Glucose-Capillary: 264 mg/dL — ABNORMAL HIGH (ref 70–99)
Glucose-Capillary: 297 mg/dL — ABNORMAL HIGH (ref 70–99)
Glucose-Capillary: 181 mg/dL — ABNORMAL HIGH (ref 70–99)
Glucose-Capillary: 197 mg/dL — ABNORMAL HIGH (ref 70–99)
Glucose-Capillary: 166 mg/dL — ABNORMAL HIGH (ref 70–99)
Glucose-Capillary: 227 mg/dL — ABNORMAL HIGH (ref 70–99)
Glucose-Capillary: 285 mg/dL — ABNORMAL HIGH (ref 70–99)

## 2017-09-14 MED ORDER — HYDROXYZINE HCL 25 MG PO TABS: 25.0000 mg | ORAL_TABLET | Freq: Three times a day (TID) | ORAL | 1 refills | Status: DC | PRN

## 2017-09-14 MED ORDER — TRAZODONE HCL 100 MG PO TABS: 100.0000 mg | ORAL_TABLET | Freq: Every day | ORAL | 1 refills | Status: DC

## 2017-09-14 MED ORDER — FLUVOXAMINE MALEATE 100 MG PO TABS: 100.0000 mg | ORAL_TABLET | Freq: Every day | ORAL | 1 refills | Status: DC

## 2017-09-14 MED ORDER — TOPIRAMATE 100 MG PO TABS: 100.0000 mg | ORAL_TABLET | Freq: Every day | ORAL | 1 refills | Status: DC

## 2017-09-14 MED ORDER — DULOXETINE HCL 60 MG PO CPEP: 60.0000 mg | ORAL_CAPSULE | Freq: Every day | ORAL | 1 refills | Status: DC

## 2017-09-14 NOTE — Progress Notes (Signed)
  [facility] Adult Case Management Discharge Plan :  Will you be returning to the same living situation after discharge:  Yes,  returning home At discharge, do you have transportation home?: Yes,  family transportation Do you have the ability to pay for your medications: Yes,  insurance  Release of information consent forms completed and in the chart;  Patient's signature needed at discharge.  Patient to Follow up at: Follow-up Information    Care, [place] on 09/17/2017.   Why:  Please go to your follow up appointment with Dr. Collie Siad on Thursday, 09/17/17 at 9:40AM. Thank you! Contact information: [place] [address] [phone]           Next level of care provider has access to [place] and Suicide Prevention discussed: Yes,  with pt and her husband  Have you used any form of tobacco in the last 30 days? (Cigarettes, Smokeless Tobacco, Cigars, and/or Pipes): No  Has patient been referred to the Quitline?: N/A patient is not a smoker  Patient has been referred for addiction treatment: N/A  Alden Hipp, LCSW 09/14/2017, 9:27 AM

## 2017-09-14 NOTE — B[facility] Suicide Risk Assessment (Signed)
[facility] Discharge Suicide Risk Assessment   Principal Problem: Severe recurrent major depression without psychotic features [facility]) Discharge Diagnoses:  Patient Active Problem List   Diagnosis Date Noted  . Severe recurrent major depression without psychotic features ([place]) [F33.2] 09/11/2017    Priority: High  . ADHD (attention deficit hyperactivity disorder) [F90.9] 09/11/2017  . Diabetes mellitus without complication ([place]) [Q59.5] 09/11/2017  . Hypertension [I10] 09/11/2017  . Chest pain [R07.9] 08/03/2015    Total Time spent with patient: 20 minutes  Musculoskeletal: Strength & Muscle Tone: within normal limits Gait & Station: normal Patient leans: N/A  Psychiatric Specialty Exam: Review of Systems  Neurological: Negative.   Psychiatric/Behavioral: Negative.   All other systems reviewed and are negative.   Blood pressure 127/89, pulse 98, temperature 98 F (36.7 C), temperature source Oral, resp. rate 16, height 5\' 10"  (1.778 m), weight 122.5 kg (270 lb), SpO2 97 %.Body mass index is 38.74 kg/m.  General Appearance: Casual  Eye Contact::  Good  Speech:  Clear and Coherent409  Volume:  Normal  Mood:  Euthymic  Affect:  Appropriate  Thought Process:  Goal Directed and Descriptions of Associations: Intact  Orientation:  Full (Time, Place, and Person)  Thought Content:  WDL  Suicidal Thoughts:  No  Homicidal Thoughts:  No  Memory:  Immediate;   Fair Recent;   Fair Remote;   Fair  Judgement:  Fair  Insight:  Fair  Psychomotor Activity:  Normal  Concentration:  Fair  Recall:  [employer] of [place]  Language: Fair  Akathisia:  No  Handed:  Right  AIMS (if indicated):     Assets:  Communication Skills Desire for Improvement Financial Resources/Insurance Housing Intimacy Physical Health Resilience Social Support Transportation Vocational/Educational  Sleep:  Number of Hours: 6.5  Cognition: WNL  ADL's:  Intact   Mental Status Per Nursing Assessment::    On Admission:  NA  Demographic Factors:  Caucasian  Loss Factors: NA  Historical Factors: NA  Risk Reduction Factors:   Sense of responsibility to family, Employed, Living with another person, especially a relative, Positive social support and Positive therapeutic relationship  Continued Clinical Symptoms:  Depression:   Insomnia  Cognitive Features That Contribute To Risk:  None    Suicide Risk:  Minimal: No identifiable suicidal ideation.  Patients presenting with no risk factors but with morbid ruminations; may be classified as minimal risk based on the severity of the depressive symptoms  Follow-up Information    [facility], [place] on 09/17/2017.   Why:  Please go to your follow up appointment with Dr. Collie Siad on Thursday, 09/17/17 at 9:40AM. Thank you! Contact information: [place] [address] [phone]           Plan Of Care/Follow-up recommendations:  Activity:  as tolerated Diet:  low sodium heart healthy ADA diet Other:  keep follow up appointments  Orson Slick, MD 09/14/2017, 9:55 AM

## 2017-09-14 NOTE — Tx Team (Signed)
Interdisciplinary Treatment and Diagnostic Plan Update  09/14/2017 Time of Session: [place] MRN: [id number]  Principal Diagnosis: Severe recurrent major depression without psychotic features [facility])  Secondary Diagnoses: Principal Problem:   Severe recurrent major depression without psychotic features ([place]) Active Problems:   ADHD (attention deficit hyperactivity disorder)   Diabetes mellitus without complication ([place])   Hypertension   Current Medications:  Current Facility-Administered Medications  Medication Dose Route Frequency Provider Last Rate Last Dose  . acetaminophen (TYLENOL) tablet 650 mg  650 mg Oral Q6H PRN Clapacs, Madie Reno, MD   650 mg at 09/11/17 2212  . ALPRAZolam (XANAX) tablet 0.5 mg  0.5 mg Oral BID PRN Pucilowska, Jolanta B, MD      . alum & mag hydroxide-simeth (MAALOX/MYLANTA) 200-200-20 MG/5ML suspension 30 mL  30 mL Oral Q4H PRN Clapacs, John T, MD      . aspirin EC tablet 81 mg  81 mg Oral Daily Clapacs, Madie Reno, MD   81 mg at 09/14/17 0747  . DULoxetine (CYMBALTA) DR capsule 60 mg  60 mg Oral Daily Pucilowska, Jolanta B, MD   60 mg at 09/14/17 0747  . fluvoxaMINE (LUVOX) tablet 100 mg  100 mg Oral QHS Pucilowska, Jolanta B, MD   100 mg at 09/13/17 2126  . hydrOXYzine (ATARAX/VISTARIL) tablet 25 mg  25 mg Oral TID PRN Clapacs, John T, MD      . insulin aspart (novoLOG) injection 0-9 Units  0-9 Units Subcutaneous TID WC Clapacs, Madie Reno, MD   2 Units at 09/14/17 0749  . magnesium hydroxide (MILK OF MAGNESIA) suspension 30 mL  30 mL Oral Daily PRN Clapacs, John T, MD      . metFORMIN (GLUCOPHAGE-XR) 24 hr tablet 1,000 mg  1,000 mg Oral Q breakfast Clapacs, Madie Reno, MD   1,000 mg at 09/14/17 0747  . rosuvastatin (CRESTOR) tablet 5 mg  5 mg Oral q1800 Clapacs, Madie Reno, MD   5 mg at 09/13/17 1646  . topiramate (TOPAMAX) tablet 100 mg  100 mg Oral QHS Pucilowska, Jolanta B, MD      . traZODone (DESYREL) tablet 100 mg  100 mg Oral QHS Pucilowska, Jolanta B,  MD   100 mg at 09/13/17 2126   PTA Medications: Medications Prior to Admission  Medication Sig Dispense Refill Last Dose  . ALPRAZolam (XANAX) 0.5 MG tablet Take 1 tablet by mouth 2 (two) times daily as needed for anxiety.  3 PRN at PRN  . aspirin EC 81 MG tablet Take 81 mg by mouth daily.     . Dapagliflozin-Metformin HCl ER (XIGDUO XR) 07-998 MG TB24 Take 1 tablet by mouth 2 (two) times daily.    09/10/2017 at 0800  . DULoxetine (CYMBALTA) 60 MG capsule Take 1 capsule by mouth 2 (two) times daily.   09/10/2017 at 0800  . rosuvastatin (CRESTOR) 5 MG tablet Take 5 mg by mouth daily.  1 09/09/2017 at 1900  . Vitamin D, Ergocalciferol, (DRISDOL) 50000 units CAPS capsule Take 50,000 Units by mouth every Friday.   09/04/2017 at 0800  . VYVANSE 50 MG capsule Take 50 mg by mouth daily.   0 09/10/2017 at 0800  . zolpidem (AMBIEN) 10 MG tablet Take 1 tablet by mouth at bedtime as needed for sleep.    PRN at PRN    Patient Stressors: Financial difficulties Health problems Loss of mother is sick   Patient Strengths: Average or above average intelligence Capable of independent living Communication skills Motivation for treatment/growth  Treatment Modalities: Medication Management, Group therapy, Case management,  1 to 1 session with clinician, Psychoeducation, Recreational therapy.   Physician Treatment Plan for Primary Diagnosis: Severe recurrent major depression without psychotic features ([place]) Long Term Goal(s): Improvement in symptoms so as ready for discharge NA   Short Term Goals: Ability to identify changes in lifestyle to reduce recurrence of condition will improve Ability to verbalize feelings will improve Ability to disclose and discuss suicidal ideas Ability to demonstrate self-control will improve Ability to identify and develop effective coping behaviors will improve Ability to maintain clinical measurements within normal limits will improve Compliance with prescribed medications  will improve Ability to identify triggers associated with substance abuse/mental health issues will improve NA  Medication Management: Evaluate patient's response, side effects, and tolerance of medication regimen.  Therapeutic Interventions: 1 to 1 sessions, Unit Group sessions and Medication administration.  Evaluation of Outcomes: Adequate for Discharge  Physician Treatment Plan for Secondary Diagnosis: Principal Problem:   Severe recurrent major depression without psychotic features ([place]) Active Problems:   ADHD (attention deficit hyperactivity disorder)   Diabetes mellitus without complication ([place])   Hypertension  Long Term Goal(s): Improvement in symptoms so as ready for discharge NA   Short Term Goals: Ability to identify changes in lifestyle to reduce recurrence of condition will improve Ability to verbalize feelings will improve Ability to disclose and discuss suicidal ideas Ability to demonstrate self-control will improve Ability to identify and develop effective coping behaviors will improve Ability to maintain clinical measurements within normal limits will improve Compliance with prescribed medications will improve Ability to identify triggers associated with substance abuse/mental health issues will improve NA     Medication Management: Evaluate patient's response, side effects, and tolerance of medication regimen.  Therapeutic Interventions: 1 to 1 sessions, Unit Group sessions and Medication administration.  Evaluation of Outcomes: Adequate for Discharge   RN Treatment Plan for Primary Diagnosis: Severe recurrent major depression without psychotic features ([place]) Long Term Goal(s): Knowledge of disease and therapeutic regimen to maintain health will improve  Short Term Goals: Ability to participate in decision making will improve, Ability to identify and develop effective coping behaviors will improve and Compliance with prescribed medications will  improve  Medication Management: RN will administer medications as ordered by provider, will assess and evaluate patient's response and provide education to patient for prescribed medication. RN will report any adverse and/or side effects to prescribing provider.  Therapeutic Interventions: 1 on 1 counseling sessions, Psychoeducation, Medication administration, Evaluate responses to treatment, Monitor vital signs and CBGs as ordered, Perform/monitor CIWA, COWS, AIMS and Fall Risk screenings as ordered, Perform wound care treatments as ordered.  Evaluation of Outcomes: Adequate for Discharge   LCSW Treatment Plan for Primary Diagnosis: Severe recurrent major depression without psychotic features ([place]) Long Term Goal(s): Safe transition to appropriate next level of care at discharge, Engage patient in therapeutic group addressing interpersonal concerns.  Short Term Goals: Engage patient in aftercare planning with referrals and resources, Increase emotional regulation and Increase skills for wellness and recovery  Therapeutic Interventions: Assess for all discharge needs, 1 to 1 time with Social worker, Explore available resources and support systems, Assess for adequacy in community support network, Educate family and significant other(s) on suicide prevention, Complete Psychosocial Assessment, Interpersonal group therapy.  Evaluation of Outcomes: Adequate for Discharge   Progress in Treatment: Attending groups: Yes. Participating in groups: Yes. Taking medication as prescribed: Yes. Toleration medication: Yes. Family/Significant other contact made: Yes, individual(s) contacted:  pt's husband Patient  understands diagnosis: Yes. Discussing patient identified problems/goals with staff: Yes. Medical problems stabilized or resolved: Yes. Denies suicidal/homicidal ideation: Yes. Issues/concerns per patient self-inventory: No. Other: None at this time.   New problem(s) identified: No,  Describe:  None at this time  New Short Term/Long Term Goal(s): Pt will report 0 SI by the time of discharge.    Patient Goals:  Pt reported her goal for treatment is, "to not be anxious about going out."   Discharge Plan or Barriers: Pt will discharge home and will continue tx in the outpatient setting with CBC.  Reason for Continuation of Hospitalization: Medication stabilization  Estimated Length of Stay: Discharge Today   Attendees: Patient: Bonnie Garcia 09/14/2017 11:32 AM  Physician: Dr. Bary Leriche, MD 09/14/2017 11:32 AM  Nursing: Polly Cobia, RN 09/14/2017 11:32 AM  RN Care Manager: 09/14/2017 11:32 AM  Social Worker: Alden Hipp, LCSW 09/14/2017 11:32 AM  Recreational Therapist: Roanna Epley, CTRS-LRT 09/14/2017 11:32 AM  Other: Dossie Arbour, LCSW 09/14/2017 11:32 AM  Other:  09/14/2017 11:32 AM  Other: 09/14/2017 11:32 AM    Scribe for Treatment Team: Alden Hipp, LCSW 09/14/2017 11:32 AM

## 2017-09-14 NOTE — Progress Notes (Signed)
Patient denies SI/HI at this time and verbally contracts for safety. Pt presents calm and cooperative, and no distress noted. Pt. Given discharge education and has been given extensive review of discharge paperwork. All Personal items in locker returned to pt. Pt escorted out of the building by staff. Pt States she will comply with discharge planning and comply with medication regimen given by treatment team for the patient.

## 2017-09-14 NOTE — Discharge Summary (Signed)
Physician Discharge Summary Note  Patient:  Bonnie Garcia is an 52 y.o., female MRN:  [id number] DOB:  08-12-1965 Patient phone:  [phone] (home)  Patient address:   [place] [address],  Total Time spent with patient: 20 minutes plus 15 min on care coordination and documantation  Date of Admission:  09/11/2017 Date of Discharge: 09/14/2017  Reason for Admission:  Depression.  History of Present Illness:   Identifying data. Bonnie Garcia is a 53 year old female with a history of depression and anxiety.  Chief complaint. "I felt strange and jittery."  History of present illness. Information was obtained from the patient and the chart. The patient came to the ER complaining of worsening of depression, extreme anxiety, crying spells and feeling jittery for the past two weeks. She has not been able to get out of bed or take care of the household. She does not want to interact with others and became fearful, not able to even leave her room without a schaperone. Her anxiety heightened with several panic attacks each day, social anxiety and excessive worries. There are no PTSD or OCD symptoms but the patient is a hoarder keeping toys, clothes and handbags. She was not overtly suicidal but went into a "dark place" and wanted to livr no more. There were several recent stressors, husband's surgery, placement of her father in a nursing home, hardship of carrying for her mother.   Past psychiatric history. Lifelong history of depression that worsened 6 years ago. She has been working with Dr. Kasandra Knudsen for 3 years on Cymbalta and Adderral/Vyanse. There were several medication adjustments recently without improvement. She stopped Vyvanse feeling that it contributed to her anxiety. She was never hospitalized. No suicide attempts. She was tried on Zoloft, Effexor, Wellbutrin, Abilify, Rexulti.   Family psychiatric history. Father with depression.  Social. She lives with her  husband. She works as a Control and instrumentation engineer, on vacation now.   Principal Problem: Severe recurrent major depression without psychotic features [facility]) Discharge Diagnoses: Patient Active Problem List   Diagnosis Date Noted  . Severe recurrent major depression without psychotic features ([place]) [F33.2] 09/11/2017    Priority: High  . ADHD (attention deficit hyperactivity disorder) [F90.9] 09/11/2017  . Diabetes mellitus without complication ([place]) [P23.3] 09/11/2017  . Hypertension [I10] 09/11/2017  . Chest pain [R07.9] 08/03/2015    Past Medical History:  Past Medical History:  Diagnosis Date  . ADHD (attention deficit hyperactivity disorder)   . Depression   . Diabetes mellitus without complication ([place])   . Hypertension   . Kidney stones     Past Surgical History:  Procedure Laterality Date  . APPENDECTOMY    . CHOLECYSTECTOMY    . COLONOSCOPY WITH PROPOFOL N/A 07/23/2015   Procedure: COLONOSCOPY WITH PROPOFOL;  Surgeon: Lollie Sails, MD;  Location[facility] ENDOSCOPY;  Service: Endoscopy;  Laterality: N/A;  . ECTOPIC PREGNANCY SURGERY    . ENDOMETRIAL ABLATION    . ESOPHAGOGASTRODUODENOSCOPY (EGD) WITH PROPOFOL N/A 07/23/2015   Procedure: ESOPHAGOGASTRODUODENOSCOPY (EGD) WITH PROPOFOL;  Surgeon: Lollie Sails, MD;  Location: [facility] ENDOSCOPY;  Service: Endoscopy;  Laterality: N/A;  . TUBAL LIGATION     Family History:  Family History  Problem Relation Age of Onset  . Breast cancer Paternal Aunt   . Breast cancer Paternal Grandmother   . Diabetes Mother   . Diabetes Father   . Hypertension Father   . Heart disease Father   . Parkinson's disease Father    Social History:  Social  History   Substance and Sexual Activity  Alcohol Use Yes  . Alcohol/week: 0.0 oz   Comment: socially     Social History   Substance and Sexual Activity  Drug Use No    Social History   Socioeconomic History  . Marital status: Married    Spouse name: Not on file  . Number of  children: Not on file  . Years of education: Not on file  . Highest education level: Not on file  Occupational History  . Not on file  Social Needs  . Financial resource strain: Not on file  . Food insecurity:    Worry: Not on file    Inability: Not on file  . Transportation needs:    Medical: Not on file    Non-medical: Not on file  Tobacco Use  . Smoking status: Former Smoker    Last attempt to quit: 2009    Years since quitting: 10.5  . Smokeless tobacco: Never Used  Substance and Sexual Activity  . Alcohol use: Yes    Alcohol/week: 0.0 oz    Comment: socially  . Drug use: No  . Sexual activity: Not on file  Lifestyle  . Physical activity:    Days per week: Not on file    Minutes per session: Not on file  . Stress: Not on file  Relationships  . Social connections:    Talks on phone: Not on file    Gets together: Not on file    Attends religious service: Not on file    Active member of club or organization: Not on file    Attends meetings of clubs or organizations: Not on file    Relationship status: Not on file  Other Topics Concern  . Not on file  Social History Narrative  . Not on file    Hospital Course:    Bonnie Garcia is a 52 year old female with a history of severe depression and anxiety admitted for worsening of her symptoms and suicidal ideation. She tolerated medication adjustment well. At the time of discharge, the patient is not suicidal, hiomicidal or excessively anxious. She is able to contract for safety. She is forward thinking and optimistic about the future.  #Mood. improved -continue lower dose of Cymbalta 60 mg daily -continue Luvox 100 mg nightly  -continue Topamax 100 mg nightly   #Insomnia, resolved with treatment -continue Trazodone 100 mg nightly PRN   #Anxiety, improved -continue Vistaril 25 mg PRN -continue Xanax 0.5 mg BID PRN as prescribed by Dr. Kasandra Knudsen, no Rx given  #ADHD -patient already discontinued Vyvanse feeling that it  causes anxiety  #DM, please reverse to home regimen -patient was given Metformin 1000 mg BID in the hospital -SSI, ADA diet CBG -Crestor 5 mg daily -ASA 81 mg daily  #Labs -lipid panel shows elevated TG, TSH is normal, A1C 8.1 -EKG reviewed, NSR with QTc 466  #Disposition -discharge to home with the husband -follow up with Dr. Kasandra Knudsen and her therapist    Physical Findings: AIMS: Facial and Oral Movements Muscles of Facial Expression: None, normal Lips and Perioral Area: None, normal Jaw: None, normal Tongue: None, normal,Extremity Movements Upper (arms, wrists, hands, fingers): None, normal Lower (legs, knees, ankles, toes): None, normal, Trunk Movements Neck, shoulders, hips: None, normal, Overall Severity Severity of abnormal movements (highest score from questions above): None, normal Incapacitation due to abnormal movements: None, normal Patient's awareness of abnormal movements (rate only patient's report): No Awareness, Dental Status Current problems with teeth and/or dentures?:  No Does patient usually wear dentures?: No  CIWA:  CIWA-Ar Total: 2 COWS:  COWS Total Score: 3  Musculoskeletal: Strength & Muscle Tone: within normal limits Gait & Station: normal Patient leans: N/A  Psychiatric Specialty Exam: Physical Exam  Nursing note and vitals reviewed. Psychiatric: She has a normal mood and affect. Her speech is normal and behavior is normal. Judgment and thought content normal. Cognition and memory are normal.    Review of Systems  Neurological: Negative.   Psychiatric/Behavioral: Negative.   All other systems reviewed and are negative.   Blood pressure 127/89, pulse 98, temperature 98 F (36.7 C), temperature source Oral, resp. rate 16, height 5\' 10"  (1.778 m), weight 122.5 kg (270 lb), SpO2 97 %.Body mass index is 38.74 kg/m.  General Appearance: Casual  Eye Contact:  Good  Speech:  Clear and Coherent  Volume:  Normal  Mood:  Euthymic  Affect:   Appropriate  Thought Process:  Goal Directed and Descriptions of Associations: Intact  Orientation:  Full (Time, Place, and Person)  Thought Content:  WDL  Suicidal Thoughts:  No  Homicidal Thoughts:  No  Memory:  Immediate;   Fair Recent;   Fair Remote;   Fair  Judgement:  Fair  Insight:  Fair  Psychomotor Activity:  Normal  Concentration:  Concentration: Fair and Attention Span: Fair  Recall:  [employer] of Knowledge:  Fair  Language:  Fair  Akathisia:  No  Handed:  Right  AIMS (if indicated):     Assets:  Communication Skills Desire for Improvement Financial Resources/Insurance Housing Intimacy Physical Health Resilience Social Support Transportation Vocational/Educational  ADL's:  Intact  Cognition:  WNL  Sleep:  Number of Hours: 6.5     Have you used any form of tobacco in the last 30 days? (Cigarettes, Smokeless Tobacco, Cigars, and/or Pipes): No  Has this patient used any form of tobacco in the last 30 days? (Cigarettes, Smokeless Tobacco, Cigars, and/or Pipes) Yes, No  Blood Alcohol level:  Lab Results  Component Value Date   ETH <10 16/12/9602    Metabolic Disorder Labs:  Lab Results  Component Value Date   HGBA1C 8.1 (H) 09/10/2017   MPG 185.77 09/10/2017   No results found for: PROLACTIN Lab Results  Component Value Date   CHOL 201 (H) 09/10/2017   TRIG 227 (H) 09/10/2017   HDL 47 09/10/2017   CHOLHDL 4.3 09/10/2017   VLDL 45 (H) 09/10/2017   LDLCALC 109 (H) 09/10/2017   LDLCALC UNABLE TO CALCULATE IF TRIGLYCERIDE OVER 400 mg/dL 08/04/2015    See Psychiatric Specialty Exam and Suicide Risk Assessment completed by Attending Physician prior to discharge.  Discharge destination:  Home  Is patient on multiple antipsychotic therapies at discharge:  No   Has Patient had three or more failed trials of antipsychotic monotherapy by history:  No  Recommended Plan for Multiple Antipsychotic Therapies: NA  Discharge Instructions    Diet - low  sodium heart healthy   Complete by:  As directed    Increase activity slowly   Complete by:  As directed      Allergies as of 09/14/2017      Reactions   Erythromycin Other (See Comments)   Reaction:  GI upset    Penicillins Hives, Other (See Comments)   Has patient had a PCN reaction causing immediate rash, facial/tongue/throat swelling, SOB or lightheadedness with hypotension: No Has patient had a PCN reaction causing severe rash involving mucus membranes or skin necrosis: No Has  patient had a PCN reaction that required hospitalization No Has patient had a PCN reaction occurring within the last 10 years: No If all of the above answers are "NO", then may proceed with Cephalosporin use.      Medication List    STOP taking these medications   VYVANSE 50 MG capsule Generic drug:  lisdexamfetamine   zolpidem 10 MG tablet Commonly known as:  AMBIEN     TAKE these medications     Indication  ALPRAZolam 0.5 MG tablet Commonly known as:  XANAX Take 1 tablet by mouth 2 (two) times daily as needed for anxiety.  Indication:  Feeling Anxious   aspirin EC 81 MG tablet Take 81 mg by mouth daily.  Indication:  Inflammation   DULoxetine 60 MG capsule Commonly known as:  CYMBALTA Take 1 capsule (60 mg total) by mouth daily. Start taking on:  09/15/2017 What changed:  when to take this  Indication:  Major Depressive Disorder   fluvoxaMINE 100 MG tablet Commonly known as:  LUVOX Take 1 tablet (100 mg total) by mouth at bedtime.  Indication:  Major Depressive Disorder, Panic Disorder   hydrOXYzine 25 MG tablet Commonly known as:  ATARAX/VISTARIL Take 1 tablet (25 mg total) by mouth 3 (three) times daily as needed for anxiety.  Indication:  Feeling Anxious   rosuvastatin 5 MG tablet Commonly known as:  CRESTOR Take 5 mg by mouth daily.  Indication:  High Amount of Fats in the Blood   topiramate 100 MG tablet Commonly known as:  TOPAMAX Take 1 tablet (100 mg total) by mouth at  bedtime.  Indication:  mood stabilization   traZODone 100 MG tablet Commonly known as:  DESYREL Take 1 tablet (100 mg total) by mouth at bedtime.  Indication:  Trouble Sleeping   Vitamin D (Ergocalciferol) 50000 units Caps capsule Commonly known as:  DRISDOL Take 50,000 Units by mouth every Friday.  Indication:  Osteoporosis   XIGDUO XR 07-998 MG Tb24 Generic drug:  Dapagliflozin-metFORMIN HCl ER Take 1 tablet by mouth 2 (two) times daily.  Indication:  Type 2 Diabetes      Follow-up Information    [facility], [place] on 09/17/2017.   Why:  Please go to your follow up appointment with Dr. Collie Siad on Thursday, 09/17/17 at 9:40AM. Thank you! Contact information: [place] [address] [phone]           Follow-up recommendations:  Activity:  as tolerated Diet:  low sodium heart healthy ADA diet Other:  keep follow up appointments  Comments:    Signed: Orson Slick, MD 09/14/2017, 9:59 AM

## 2017-09-14 NOTE — Progress Notes (Signed)
Recreation Therapy Notes  Date: 09/14/2017  Time: 9:30 am  Location: Craft Room  Behavioral response: Appropriate    Intervention Topic: Stress  Discussion/Intervention:  Group content on today was focused on stress. The group defined stress and ways to cope with stress. Participants expressed how they know when they are stresses out. Individuals described the different ways they have to cope with stress. The group stated reasons why it is important to cope with stress. Patient explained what good stress is and some examples. The group participated in the intervention "Stress Management Jeopardy". Individuals were separated into two group and answered questions related to stress.   Clinical Observations/Feedback:  Patient came to group and identified her job as stress. She stated when she is stressed her heart starts beating fast. Individual participated in the intervention and was social with peers and staff while in group. Romyn Boswell LRT/CTRS         Ayad Nieman 09/14/2017 1:02 PM

## 2017-09-14 NOTE — Plan of Care (Signed)
Cooperative with treatment, she spent most of the evening in the dayroom chatting with peers. Pleasant on approach, logical in conversation. No issues to report on shift at this time.

## 2017-10-21 ENCOUNTER — Ambulatory Visit (INDEPENDENT_AMBULATORY_CARE_PROVIDER_SITE_OTHER): Payer: BC Managed Care – PPO | Admitting: Family Medicine

## 2017-10-21 ENCOUNTER — Encounter: Payer: Self-pay | Admitting: Family Medicine

## 2017-10-21 VITALS — BP 136/90 | HR 82 | Ht 68.0 in | Wt 273.0 lb

## 2017-10-21 DIAGNOSIS — L680 Hirsutism: Secondary | ICD-10-CM

## 2017-10-21 DIAGNOSIS — E1169 Type 2 diabetes mellitus with other specified complication: Secondary | ICD-10-CM

## 2017-10-21 DIAGNOSIS — Z23 Encounter for immunization: Secondary | ICD-10-CM | POA: Diagnosis not present

## 2017-10-21 DIAGNOSIS — E119 Type 2 diabetes mellitus without complications: Secondary | ICD-10-CM

## 2017-10-21 DIAGNOSIS — F332 Major depressive disorder, recurrent severe without psychotic features: Secondary | ICD-10-CM | POA: Diagnosis not present

## 2017-10-21 DIAGNOSIS — E282 Polycystic ovarian syndrome: Secondary | ICD-10-CM | POA: Diagnosis not present

## 2017-10-21 DIAGNOSIS — R300 Dysuria: Secondary | ICD-10-CM | POA: Insufficient documentation

## 2017-10-21 DIAGNOSIS — Z114 Encounter for screening for human immunodeficiency virus [HIV]: Secondary | ICD-10-CM

## 2017-10-21 DIAGNOSIS — E785 Hyperlipidemia, unspecified: Secondary | ICD-10-CM

## 2017-10-21 DIAGNOSIS — E041 Nontoxic single thyroid nodule: Secondary | ICD-10-CM

## 2017-10-21 DIAGNOSIS — I10 Essential (primary) hypertension: Secondary | ICD-10-CM

## 2017-10-21 LAB — POCT UA - MICROALBUMIN: Microalbumin Ur, POC: 20 mg/L

## 2017-10-21 LAB — POCT URINALYSIS DIPSTICK
APPEARANCE: NORMAL
Glucose, UA: POSITIVE — AB
Leukocytes, UA: NEGATIVE
NITRITE UA: NEGATIVE
ODOR: NORMAL
PH UA: 6 (ref 5.0–8.0)
PROTEIN UA: POSITIVE — AB
Spec Grav, UA: 1.015 (ref 1.010–1.025)
UROBILINOGEN UA: 0.2 U/dL

## 2017-10-21 MED ORDER — DAPAGLIFLOZIN PRO-METFORMIN ER 5-1000 MG PO TB24: 1.0000 | ORAL_TABLET | Freq: Two times a day (BID) | ORAL | 5 refills | Status: DC

## 2017-10-21 MED ORDER — SPIRONOLACTONE 25 MG PO TABS: 25.0000 mg | ORAL_TABLET | Freq: Every day | ORAL | 3 refills | Status: DC

## 2017-10-21 NOTE — Progress Notes (Signed)
Patient: Bonnie Garcia, Female    DOB: 15-Nov-1965, 52 y.o.   MRN: [id number] Visit Date: 10/21/2017  Today's Provider: Lavon Paganini, MD   I, Martha Clan, CMA, am acting as scribe for Lavon Paganini, MD.  Chief Complaint  Patient presents with  . New Patient (Initial Visit)   Subjective:    Establish Care Patient was previously seen by Dr. Ronnald Ramp at [facility] for many years.  Pt would like to have labs checked, including FSH and LH. She is more emotional than normal.  She has been out of work for several months.  She continues to have intermittent crying "over nothing."  She is being managed for severe MDD by Myer Haff at [facility]. Also gets Spravato (Esketamine) treatments twice weekly in [place].  She does not feel this is helping.  She is undergoing medication changes as well.  She recently stopped taking Cymbalta and Luvox and was started on Celexa 20 mg daily.  She is having some mild nausea.  She reports significant depression and anxiety symptoms.  She does have passive SI, but has no plan.  Her mother and husband have not been leaving her alone and watching her closely to ensure she does not hurt herself.  Her mother does not think she is currently a danger to herself.  She was recently admitted for suicidal ideation in 08/2017.  She is also taking Xanax 0.5 mg twice daily as needed and hydroxyzine 25 mg 3 times daily as needed for anxiety.  She also takes Ambien 10 mg nightly for sleep.  She is also previously tried Topamax and trazodone.  Depression screen PHQ 2/9 10/21/2017  Decreased Interest 3  Down, Depressed, Hopeless 3  PHQ - 2 Score 6  Altered sleeping 3  Tired, decreased energy 3  Change in appetite 3  Feeling bad or failure about yourself  3  Trouble concentrating 3  Moving slowly or fidgety/restless 2  Suicidal thoughts 2  PHQ-9 Score 25  Difficult doing work/chores Extremely dIfficult    ----------------------------------------------------------------- GAD 7 : Generalized Anxiety Score 10/21/2017  Nervous, Anxious, on Edge 3  Control/stop worrying 3  Worry too much - different things 3  Trouble relaxing 3  Restless 3  Easily annoyed or irritable 3  Afraid - awful might happen 3  Total GAD 7 Score 21  Anxiety Difficulty Extremely difficult    Patient is also concerned because she has had dysuria for about 1 week.  She is also had urinary frequency and nausea.  She denies any abdominal pain, fevers, flank pain, urgency, hematuria, constipation, diarrhea.  She wonders if she may have a UTI.  No recent antibiotics.  Patient was previously followed by Dr. Eddie Dibbles with endocrinology for her thyroid nodules as well as her diabetes.  She had FNA biopsy in 2017 of the largest thyroid nodule and it was found to be benign.  She had a follow-up ultrasound in 10/2016 that showed stability of the nodules.  She was also being treated for PCOS.  She previously took Spironolactone but believes she stopped this independently.  T2DM - Checking BG at home: no - Medications: Xigduo 5-1000mg  BID - Compliance: Good - Diet: " Could be better" - Exercise: None - eye exam: Needs - foot exam: Needs - microalbumin: Needs - denies symptoms of hypoglycemia, polyuria, polydipsia, numbness extremities, foot ulcers/trauma  HTN: - Medications: None currently - Compliance: N/A - Checking BP at home: No - Denies any SOB, CP,  vision changes, LE edema, medication SEs, or symptoms of hypotension - Diet: Low-sodium - Exercise: None  HLD - medications: Crestor 5 mg daily - compliance: Good - medication SEs: None    Review of Systems  Constitutional: Positive for activity change, appetite change, chills, diaphoresis and fatigue. Negative for fever and unexpected weight change.  HENT: Positive for dental problem. Negative for congestion, drooling, ear discharge, ear pain, facial swelling, hearing  loss, mouth sores, nosebleeds, postnasal drip, rhinorrhea, sinus pressure, sinus pain, sneezing, sore throat, tinnitus, trouble swallowing and voice change.   Eyes: Negative.   Respiratory: Negative.   Cardiovascular: Negative.   Gastrointestinal: Negative.   Endocrine: Negative.   Genitourinary: Positive for decreased urine volume, dysuria, urgency and vaginal bleeding. Negative for difficulty urinating, dyspareunia, enuresis, flank pain, frequency, genital sores, hematuria, menstrual problem, pelvic pain, vaginal discharge and vaginal pain.  Musculoskeletal: Negative.   Skin: Negative.   Allergic/Immunologic: Negative.   Neurological: Negative.   Hematological: Negative.   Psychiatric/Behavioral: Positive for agitation, confusion, decreased concentration, dysphoric mood and sleep disturbance. Negative for behavioral problems, hallucinations, self-injury and suicidal ideas. The patient is nervous/anxious. The patient is not hyperactive.     Social History      She  reports that she quit smoking about 10 years ago. She has never used smokeless tobacco. She reports that she drinks alcohol. She reports that she does not use drugs.       Social History   Socioeconomic History  . Marital status: Married    Spouse name: Not on file  . Number of children: Not on file  . Years of education: Not on file  . Highest education level: Not on file  Occupational History  . Not on file  Social Needs  . Financial resource strain: Not on file  . Food insecurity:    Worry: Not on file    Inability: Not on file  . Transportation needs:    Medical: Not on file    Non-medical: Not on file  Tobacco Use  . Smoking status: Former Smoker    Last attempt to quit: 2009    Years since quitting: 10.6  . Smokeless tobacco: Never Used  Substance and Sexual Activity  . Alcohol use: Yes    Alcohol/week: 0.0 standard drinks    Comment: socially  . Drug use: No  . Sexual activity: Not on file  Lifestyle    . Physical activity:    Days per week: Not on file    Minutes per session: Not on file  . Stress: Not on file  Relationships  . Social connections:    Talks on phone: Not on file    Gets together: Not on file    Attends religious service: Not on file    Active member of club or organization: Not on file    Attends meetings of clubs or organizations: Not on file    Relationship status: Not on file  Other Topics Concern  . Not on file  Social History Narrative  . Not on file    Past Medical History:  Diagnosis Date  . ADHD (attention deficit hyperactivity disorder)   . Anxiety   . Depression   . Diabetes mellitus without complication ([place])   . Hypertension   . Kidney stones   . Thyroid nodule      Patient Active Problem List   Diagnosis Date Noted  . Severe recurrent major depression without psychotic features ([place]) 09/11/2017  . ADHD (attention deficit hyperactivity  disorder) 09/11/2017  . Diabetes mellitus without complication ([place]) 62/95/2841  . Hypertension 09/11/2017  . Chest pain 08/03/2015    Past Surgical History:  Procedure Laterality Date  . APPENDECTOMY    . CHOLECYSTECTOMY    . COLONOSCOPY WITH PROPOFOL N/A 07/23/2015   Procedure: COLONOSCOPY WITH PROPOFOL;  Surgeon: Lollie Sails, MD;  Location: [facility] ENDOSCOPY;  Service: Endoscopy;  Laterality: N/A;  . ECTOPIC PREGNANCY SURGERY    . ENDOMETRIAL ABLATION    . ESOPHAGOGASTRODUODENOSCOPY (EGD) WITH PROPOFOL N/A 07/23/2015   Procedure: ESOPHAGOGASTRODUODENOSCOPY (EGD) WITH PROPOFOL;  Surgeon: Lollie Sails, MD;  Location: [facility] ENDOSCOPY;  Service: Endoscopy;  Laterality: N/A;  . TUBAL LIGATION      Family History        Family Status  Relation Name Status  . Ethlyn Daniels  (Not Specified)  . PGM  (Not Specified)  . Mother  Alive  . Father  Alive  . Sister  Alive  . Daughter  Alive  . Son  Alive  . Mat Uncle  (Not Specified)  . MGM  (Not Specified)  . MGF  (Not Specified)        Her family  history includes Anxiety disorder in her daughter, sister, and son; Arthritis in her sister; Breast cancer in her paternal aunt and paternal grandmother; Cataracts in her maternal grandmother; Depression in her daughter, father, and son; Diabetes in her father, mother, and paternal grandmother; GER disease in her father; Glaucoma in her mother; Heart attack in her maternal grandfather; Heart disease in her father; Hypertension in her father, paternal grandmother, and sister; Kidney disease in her father; Parkinson's disease in her father; Seizures in her father; Skin cancer in her father; Sleep apnea in her father; Stroke in her father; Suicidality in her maternal uncle; Thyroid cancer in her maternal uncle.      Allergies  Allergen Reactions  . Erythromycin Other (See Comments)    Reaction:  GI upset   . Penicillins Hives and Other (See Comments)    Has patient had a PCN reaction causing immediate rash, facial/tongue/throat swelling, SOB or lightheadedness with hypotension: No Has patient had a PCN reaction causing severe rash involving mucus membranes or skin necrosis: No Has patient had a PCN reaction that required hospitalization No Has patient had a PCN reaction occurring within the last 10 years: No If all of the above answers are "NO", then may proceed with Cephalosporin use.     Current Outpatient Medications:  .  ALPRAZolam (XANAX) 0.5 MG tablet, Take 1 tablet by mouth 2 (two) times daily as needed for anxiety., Disp: , Rfl: 3 .  aspirin EC 81 MG tablet, Take 81 mg by mouth daily., Disp: , Rfl:  .  citalopram (CELEXA) 20 MG tablet, Take 1 tablet by mouth daily., Disp: , Rfl: 1 .  Dapagliflozin-Metformin HCl ER (XIGDUO XR) 07-998 MG TB24, Take 1 tablet by mouth 2 (two) times daily. , Disp: , Rfl:  .  hydrOXYzine (ATARAX/VISTARIL) 25 MG tablet, Take 1 tablet (25 mg total) by mouth 3 (three) times daily as needed for anxiety., Disp: 90 tablet, Rfl: 1 .  rosuvastatin (CRESTOR) 5 MG tablet,  Take 5 mg by mouth daily., Disp: , Rfl: 1 .  SPRAVATO, 56 MG DOSE, 28 MG/DEVICE SOPK, 1 Dose 2 (two) times a week., Disp: , Rfl:  .  Vitamin D, Ergocalciferol, (DRISDOL) 50000 units CAPS capsule, Take 50,000 Units by mouth every Friday., Disp: , Rfl:  .  zolpidem (AMBIEN) 10 MG  tablet, Take 1 tablet by mouth at bedtime., Disp: , Rfl: 3   Patient Care Team: Virginia Crews, MD as PCP - General (Family Medicine)      Objective:   Vitals: BP 136/90 (BP Location: Left Arm, Patient Position: Sitting, Cuff Size: Large)   Pulse 82   Ht 5\' 8"  (1.727 m)   Wt 273 lb (123.8 kg)   LMP 10/05/2017   SpO2 98%   BMI 41.51 kg/m    Vitals:   10/21/17 0903  BP: 136/90  Pulse: 82  SpO2: 98%  Weight: 273 lb (123.8 kg)  Height: 5\' 8"  (1.727 m)     Physical Exam  Constitutional: She is oriented to person, place, and time. She appears well-developed and well-nourished. No distress.  HENT:  Head: Normocephalic and atraumatic.  Right Ear: External ear normal.  Left Ear: External ear normal.  Nose: Nose normal.  Mouth/Throat: Oropharynx is clear and moist.  Eyes: Pupils are equal, round, and reactive to light. Conjunctivae and EOM are normal. No scleral icterus.  Neck: Neck supple. No thyromegaly present.  Cardiovascular: Normal rate, regular rhythm, normal heart sounds and intact distal pulses.  No murmur heard. Pulmonary/Chest: Effort normal and breath sounds normal. No respiratory distress. She has no wheezes. She has no rales.  Abdominal: Soft. Bowel sounds are normal. She exhibits no distension. There is no tenderness. There is no rebound and no guarding.  Musculoskeletal: She exhibits no edema or deformity.  Lymphadenopathy:    She has no cervical adenopathy.  Neurological: She is alert and oriented to person, place, and time.  Skin: Skin is warm and dry. Capillary refill takes less than 2 seconds. No rash noted.  Psychiatric: Her mood appears anxious. Her affect is blunt. Her speech  is delayed. Her speech is not rapid and/or pressured, not tangential and not slurred. She is withdrawn. She is not agitated, not aggressive, not actively hallucinating and not combative. Cognition and memory are normal. She exhibits a depressed mood. She expresses suicidal ideation. She expresses no homicidal ideation. She expresses no suicidal plans and no homicidal plans. She is communicative.  Vitals reviewed.    Depression Screen PHQ 2/9 Scores 10/21/2017  PHQ - 2 Score 6  PHQ- 9 Score 25    Results for orders placed or performed in visit on 10/21/17  POCT urinalysis dipstick  Result Value Ref Range   Color, UA yellow    Clarity, UA clear    Glucose, UA Positive (A) Negative   Bilirubin, UA small    Ketones, UA small    Spec Grav, UA 1.015 1.010 - 1.025   Blood, UA NH Trace    pH, UA 6.0 5.0 - 8.0   Protein, UA Positive (A) Negative   Urobilinogen, UA 0.2 0.2 or 1.0 E.U./dL   Nitrite, UA negative    Leukocytes, UA Negative Negative   Appearance normal    Odor normal   POCT UA - Microalbumin  Result Value Ref Range   Microalbumin Ur, POC 20 mg/L    Assessment & Plan:    Problem List Items Addressed This Visit      Cardiovascular and Mediastinum   Hypertension - Primary    Fairly well controlled with diet Not currently taking any medications As below, will be starting spironolactone for PCOS and hirsutism, which will likely improve her blood pressure is well Reviewed recent CMP      Relevant Medications   spironolactone (ALDACTONE) 25 MG tablet     Endocrine  Diabetes mellitus without complication (HCC)    Last A1c last month 8.1 She is not checking home BGs Will continue current medications and recheck A1c in 2 months PNA and flu vaccines today Referral to Optho for eye exam Foot exam at next visit      Relevant Medications   Dapagliflozin-metFORMIN HCl ER (XIGDUO XR) 07-998 MG TB24   Other Relevant Orders   Ambulatory referral to Ophthalmology   POCT  UA - Microalbumin (Completed)   Pneumococcal polysaccharide vaccine 23-valent greater than or equal to 2yo subcutaneous/IM (Completed)   Thyroid nodule    Previously followed by endocrinology Benign biopsy in 2017 Follow-up ultrasound in 2018 showed stability of nodules Recent TSH within normal limits      Hyperlipidemia associated with type 2 diabetes mellitus ([place])    Reviewed recent lipid panel Continue Crestor 5 mg daily      Relevant Medications   spironolactone (ALDACTONE) 25 MG tablet   Dapagliflozin-metFORMIN HCl ER (XIGDUO XR) 07-998 MG TB24   PCOS (polycystic ovarian syndrome)    Chronic and stable No menses status post endometrial ablation Check FSH and LH today to see if patient is menopausal      Relevant Orders   FSH/LH     Musculoskeletal and Integument   Hirsutism    Secondary to PCOS Start spironolactone 25 mg daily to see if this will help Discussed that it can take about 6 months to see full effects of this medication      Relevant Orders   FSH/LH     Other   Severe recurrent major depression without psychotic features ([place])    Followed by psychiatry and undergoing Spravato treatments twice weekly Seems that she has significant depression that is resistant to treatment She has tried multiple different medications in the past She does have SI, but this is passive without a plan I do not believe she is a danger to herself or needs hospitalization at this time Her mother and husband are not leaving her alone and watching her closely No changes to medication today as this is being managed by psychiatry I suspect that this and not menopause is the cause of her tearfulness and mood symptoms, but I did agree to check [facility] and LH Could consider seeing gynecology to discuss hormone replacement therapy if menopause is playing a role in her depression, but did discuss that she is high risk given her diabetes and hyperlipidemia      Relevant Medications    citalopram (CELEXA) 20 MG tablet   SPRAVATO, 56 MG DOSE, 28 MG/DEVICE SOPK   Dysuria    New problem No signs of UTI on UA in office today We will send micro to follow-up on trace nonhemolyzed blood and culture to ensure no occult UTI      Relevant Orders   POCT urinalysis dipstick (Completed)   Urine Culture   Urinalysis, microscopic only    Other Visit Diagnoses    Screening for HIV (human immunodeficiency virus)       Relevant Orders   HIV antibody (with reflex)   Flu vaccine need       Relevant Orders   Flu Vaccine QUAD 36+ mos IM (Completed)   Need for pneumococcal vaccination       Relevant Orders   Pneumococcal polysaccharide vaccine 23-valent greater than or equal to 2yo subcutaneous/IM (Completed)       Return in about 4 weeks (around 11/18/2017) for CPE and 2 months for chronic disease f/u.  Addressed extensive list of chronic and acute medical problems today requiring extensive time in counseling and coordination of care.  Over half of this 60 minute visit were spent in counseling and coordinating care of multiple medical problems.  The entirety of the information documented in the History of Present Illness, Review of Systems and Physical Exam were personally obtained by me. Portions of this information were initially documented by Raquel Sarna Ratchford, CMA and reviewed by me for thoroughness and accuracy.    Virginia Crews, MD, MPH [facility] 10/21/2017 10:41 AM

## 2017-10-21 NOTE — Assessment & Plan Note (Signed)
Chronic and stable No menses status post endometrial ablation Check FSH and LH today to see if patient is menopausal

## 2017-10-21 NOTE — Assessment & Plan Note (Signed)
Secondary to PCOS Start spironolactone 25 mg daily to see if this will help Discussed that it can take about 6 months to see full effects of this medication

## 2017-10-21 NOTE — Patient Instructions (Signed)
Polycystic Ovarian Syndrome °Polycystic ovarian syndrome (PCOS) is a common hormonal disorder among women of reproductive age. In most women with PCOS, many small fluid-filled sacs (cysts) grow on the ovaries, and the cysts are not part of a normal menstrual cycle. PCOS can cause problems with your menstrual periods and make it difficult to get pregnant. It can also cause an increased risk of miscarriage with pregnancy. If it is not treated, PCOS can lead to serious health problems, such as diabetes and heart disease. °What are the causes? °The cause of PCOS is not known, but it may be the result of a combination of certain factors, such as: °· Irregular menstrual cycle. °· High levels of certain hormones (androgens). °· Problems with the hormone that helps to control blood sugar (insulin resistance). °· Certain genes. ° °What increases the risk? °This condition is more likely to develop in women who have a family history of PCOS. °What are the signs or symptoms? °Symptoms of PCOS may include: °· Multiple ovarian cysts. °· Infrequent periods or no periods. °· Periods that are too frequent or too heavy. °· Unpredictable periods. °· Inability to get pregnant (infertility) because of not ovulating. °· Increased growth of hair on the face, chest, stomach, back, thumbs, thighs, or toes. °· Acne or oily skin. Acne may develop during adulthood, and it may not respond to treatment. °· Pelvic pain. °· Weight gain or obesity. °· Patches of thickened and dark brown or black skin on the neck, arms, breasts, or thighs (acanthosis nigricans). °· Excess hair growth on the face, chest, abdomen, or upper thighs (hirsutism). ° °How is this diagnosed? °This condition is diagnosed based on: °· Your medical history. °· A physical exam, including a pelvic exam. Your health care provider may look for areas of increased hair growth on your skin. °· Tests, such as: °? Ultrasound. This may be used to examine the ovaries and the lining of the  uterus (endometrium) for cysts. °? Blood tests. These may be used to check levels of sugar (glucose), female hormone (testosterone), and female hormones (estrogen and progesterone) in your blood. ° °How is this treated? °There is no cure for PCOS, but treatment can help to manage symptoms and prevent more health problems from developing. Treatment varies depending on: °· Your symptoms. °· Whether you want to have a baby or whether you need birth control (contraception). ° °Treatment may include nutrition and lifestyle changes along with: °· Progesterone hormone to start a menstrual period. °· Birth control pills to help you have regular menstrual periods. °· Medicines to make you ovulate, if you want to get pregnant. °· Medicine to reduce excessive hair growth. °· Surgery, in severe cases. This may involve making small holes in one or both of your ovaries. This decreases the amount of testosterone that your body produces. ° °Follow these instructions at home: °· Take over-the-counter and prescription medicines only as told by your health care provider. °· Follow a healthy meal plan. This can help you reduce the effects of PCOS. °? Eat a healthy diet that includes lean proteins, complex carbohydrates, fresh fruits and vegetables, low-fat dairy products, and healthy fats. Make sure to eat enough fiber. °· If you are overweight, lose weight as told by your health care provider. °? Losing 10% of your body weight may improve symptoms. °? Your health care provider can determine how much weight loss is best for you and can help you lose weight safely. °· Keep all follow-up visits as told by   your health care provider. This is important. °Contact a health care provider if: °· Your symptoms do not get better with medicine. °· You develop new symptoms. °This information is not intended to replace advice given to you by your health care provider. Make sure you discuss any questions you have with your health care  provider. °Document Released: 06/13/2004 Document Revised: 10/16/2015 Document Reviewed: 08/05/2015 °Elsevier Interactive Patient Education © 2018 [employer] ° °

## 2017-10-21 NOTE — Assessment & Plan Note (Signed)
Reviewed recent lipid panel Continue Crestor 5 mg daily

## 2017-10-21 NOTE — Assessment & Plan Note (Signed)
Previously followed by endocrinology Benign biopsy in 2017 Follow-up ultrasound in 2018 showed stability of nodules Recent TSH within normal limits

## 2017-10-21 NOTE — Assessment & Plan Note (Signed)
Fairly well controlled with diet Not currently taking any medications As below, will be starting spironolactone for PCOS and hirsutism, which will likely improve her blood pressure is well Reviewed recent CMP

## 2017-10-21 NOTE — Assessment & Plan Note (Signed)
Followed by psychiatry and undergoing Spravato treatments twice weekly Seems that she has significant depression that is resistant to treatment She has tried multiple different medications in the past She does have SI, but this is passive without a plan I do not believe she is a danger to herself or needs hospitalization at this time Her mother and husband are not leaving her alone and watching her closely No changes to medication today as this is being managed by psychiatry I suspect that this and not menopause is the cause of her tearfulness and mood symptoms, but I did agree to check [facility] and LH Could consider seeing gynecology to discuss hormone replacement therapy if menopause is playing a role in her depression, but did discuss that she is high risk given her diabetes and hyperlipidemia

## 2017-10-21 NOTE — Assessment & Plan Note (Signed)
Last A1c last month 8.1 She is not checking home BGs Will continue current medications and recheck A1c in 2 months PNA and flu vaccines today Referral to Optho for eye exam Foot exam at next visit

## 2017-10-21 NOTE — Assessment & Plan Note (Signed)
New problem No signs of UTI on UA in office today We will send micro to follow-up on trace nonhemolyzed blood and culture to ensure no occult UTI

## 2017-10-22 ENCOUNTER — Telehealth: Payer: Self-pay

## 2017-10-22 LAB — URINALYSIS, MICROSCOPIC ONLY
BACTERIA UA: NONE SEEN
Casts: NONE SEEN /lpf

## 2017-10-22 LAB — HIV ANTIBODY (ROUTINE TESTING W REFLEX): HIV Screen 4th Generation wRfx: NONREACTIVE

## 2017-10-22 LAB — FSH/LH
FSH: 20.5 m[IU]/mL
LH: 17.9 m[IU]/mL

## 2017-10-22 NOTE — Telephone Encounter (Signed)
-----   Message from Virginia Crews, MD sent at 10/22/2017  9:43 AM EDT ----- So far, no signs of urinary infection or blood in urine.  Hormone levels are not post-menopausal.  Negative HIV screen  Bacigalupo, Dionne Bucy, MD, MPH [facility] 10/22/2017 9:43 AM

## 2017-10-22 NOTE — Telephone Encounter (Signed)
Pt advised.

## 2017-10-23 ENCOUNTER — Telehealth: Payer: Self-pay

## 2017-10-23 LAB — URINE CULTURE

## 2017-10-23 NOTE — Telephone Encounter (Signed)
-----   Message from Virginia Crews, MD sent at 10/23/2017  8:20 AM EDT ----- No true urinary infection on culture.    Virginia Crews, MD, MPH [facility] 10/23/2017 8:20 AM

## 2017-10-23 NOTE — Telephone Encounter (Signed)
Pt advised.

## 2017-11-18 ENCOUNTER — Encounter: Payer: Self-pay | Admitting: Family Medicine

## 2017-11-18 ENCOUNTER — Other Ambulatory Visit (HOSPITAL_COMMUNITY)
Admission: RE | Admit: 2017-11-18 | Discharge: 2017-11-18 | Disposition: A | Discharge status: Home / Self Care | Payer: BC Managed Care – PPO | Source: Ambulatory Visit | Attending: Family Medicine | Admitting: Family Medicine

## 2017-11-18 ENCOUNTER — Ambulatory Visit (INDEPENDENT_AMBULATORY_CARE_PROVIDER_SITE_OTHER): Payer: BC Managed Care – PPO | Admitting: Family Medicine

## 2017-11-18 VITALS — BP 138/98 | HR 88 | Temp 98.3°F | Ht 68.0 in | Wt 276.2 lb

## 2017-11-18 DIAGNOSIS — Z1231 Encounter for screening mammogram for malignant neoplasm of breast: Secondary | ICD-10-CM

## 2017-11-18 DIAGNOSIS — Z124 Encounter for screening for malignant neoplasm of cervix: Secondary | ICD-10-CM | POA: Diagnosis not present

## 2017-11-18 DIAGNOSIS — Z0000 Encounter for general adult medical examination without abnormal findings: Secondary | ICD-10-CM | POA: Diagnosis not present

## 2017-11-18 DIAGNOSIS — Z1239 Encounter for other screening for malignant neoplasm of breast: Secondary | ICD-10-CM

## 2017-11-18 DIAGNOSIS — Z1151 Encounter for screening for human papillomavirus (HPV): Secondary | ICD-10-CM | POA: Insufficient documentation

## 2017-11-18 NOTE — Progress Notes (Signed)
Patient: Bonnie Garcia, Female    DOB: 1966/01/21, 52 y.o.   MRN: [id number] Visit Date: 11/18/2017  Today's Provider: Lavon Paganini, MD   Chief Complaint  Patient presents with  . Annual Exam   Subjective:  I, Tiburcio Pea, CMA, am acting as a scribe for Lavon Paganini, MD.    Annual physical exam Bonnie Garcia is a 52 y.o. female who presents today for health maintenance and complete physical. She feels fairly well. She reports exercising none. She reports she is sleeping fairly well.  -----------------------------------------------------------------   Review of Systems  Constitutional: Positive for activity change, diaphoresis and fatigue.       Crying   HENT: Negative.   Eyes: Negative.   Respiratory: Negative.   Cardiovascular: Negative.   Gastrointestinal: Negative.   Endocrine: Negative.   Genitourinary: Negative.   Musculoskeletal: Negative.   Skin: Negative.   Allergic/Immunologic: Negative.   Neurological: Negative.   Hematological: Negative.   Psychiatric/Behavioral: The patient is nervous/anxious.     Social History      She  reports that she quit smoking about 10 years ago. Her smoking use included cigarettes. She has a 15.00 pack-year smoking history. She has never used smokeless tobacco. She reports that she drinks alcohol. She reports that she does not use drugs.       Social History   Socioeconomic History  . Marital status: Married    Spouse name: Dellis Filbert  . Number of children: 2  . Years of education: 13  . Highest education level: High school graduate  Occupational History  . Occupation: Financial planner: [employer]  . Financial resource strain: Not on file  . Food insecurity:    Worry: Not on file    Inability: Not on file  . Transportation needs:    Medical: Not on file    Non-medical: Not on file  Tobacco Use  . Smoking status: Former Smoker    Packs/day: 1.00     Years: 15.00    Pack years: 15.00    Types: Cigarettes    Last attempt to quit: 2009    Years since quitting: 10.7  . Smokeless tobacco: Never Used  Substance and Sexual Activity  . Alcohol use: Yes    Comment: socially, not even monthly  . Drug use: No  . Sexual activity: Yes    Partners: Male    Birth control/protection: Surgical  Lifestyle  . Physical activity:    Days per week: Not on file    Minutes per session: Not on file  . Stress: Not on file  Relationships  . Social connections:    Talks on phone: Not on file    Gets together: Not on file    Attends religious service: Not on file    Active member of club or organization: Not on file    Attends meetings of clubs or organizations: Not on file    Relationship status: Not on file  Other Topics Concern  . Not on file  Social History Narrative   Pt has 2 biological children, and she adopted her nephew (who committed suicide at age 47).    Past Medical History:  Diagnosis Date  . ADHD (attention deficit hyperactivity disorder)   . Anxiety   . Depression   . Diabetes mellitus without complication ([place])   . Hypertension   . Kidney stones   . Thyroid nodule  Patient Active Problem List   Diagnosis Date Noted  . Thyroid nodule 10/21/2017  . Hyperlipidemia associated with type 2 diabetes mellitus ([place]) 10/21/2017  . PCOS (polycystic ovarian syndrome) 10/21/2017  . Hirsutism 10/21/2017  . Dysuria 10/21/2017  . Severe recurrent major depression without psychotic features ([place]) 09/11/2017  . ADHD (attention deficit hyperactivity disorder) 09/11/2017  . Diabetes mellitus without complication ([place]) 62/70/3500  . Hypertension 09/11/2017  . B12 deficiency 10/13/2013  . Morbid obesity ([place]) 10/13/2013  . Ureteric stone 06/08/2013  . Calculus of kidney 01/21/2013    Past Surgical History:  Procedure Laterality Date  . APPENDECTOMY    . CHOLECYSTECTOMY    . COLONOSCOPY WITH PROPOFOL N/A 07/23/2015    Procedure: COLONOSCOPY WITH PROPOFOL;  Surgeon: Lollie Sails, MD;  Location: [facility] ENDOSCOPY;  Service: Endoscopy;  Laterality: N/A;  . ECTOPIC PREGNANCY SURGERY    . ENDOMETRIAL ABLATION    . ESOPHAGOGASTRODUODENOSCOPY (EGD) WITH PROPOFOL N/A 07/23/2015   Procedure: ESOPHAGOGASTRODUODENOSCOPY (EGD) WITH PROPOFOL;  Surgeon: Lollie Sails, MD;  Location: [facility] ENDOSCOPY;  Service: Endoscopy;  Laterality: N/A;  . TUBAL LIGATION      Family History        Family Status  Relation Name Status  . Ethlyn Daniels  (Not Specified)  . PGM  (Not Specified)  . Mother  Alive  . Father  Alive  . Sister  Alive  . Daughter  Alive  . Son  Alive  . Mat Uncle  (Not Specified)  . MGM  (Not Specified)  . MGF  (Not Specified)  . PGF  (Not Specified)  . Neg Hx  (Not Specified)        Her family history includes Anxiety disorder in her daughter, sister, and son; Arthritis in her sister; Bell's palsy in her father; Breast cancer in her paternal aunt and paternal grandmother; Cataracts in her maternal grandmother; Depression in her daughter, father, and son; Diabetes in her father, mother, and paternal grandmother; GER disease in her father; Glaucoma in her mother; Heart attack (age of onset: 52) in her maternal grandfather; Heart disease in her father; Hypertension in her father, paternal grandmother, and sister; Kidney disease in her father; Pancreatic cancer in her paternal aunt; Parkinson's disease in her father; Seizures in her father; Skin cancer in her father; Sleep apnea in her father; Stroke in her father; Suicidality in her maternal uncle; Throat cancer in her paternal grandfather; Thyroid cancer in her maternal uncle; Thyroid disease in her paternal aunt. There is no history of Colon cancer, Ovarian cancer, or Cervical cancer.      Allergies  Allergen Reactions  . Erythromycin Other (See Comments)    Reaction:  GI upset   . Penicillins Hives and Other (See Comments)    Has patient had a PCN  reaction causing immediate rash, facial/tongue/throat swelling, SOB or lightheadedness with hypotension: No Has patient had a PCN reaction causing severe rash involving mucus membranes or skin necrosis: No Has patient had a PCN reaction that required hospitalization No Has patient had a PCN reaction occurring within the last 10 years: No If all of the above answers are "NO", then may proceed with Cephalosporin use.     Current Outpatient Medications:  .  ALPRAZolam (XANAX) 0.5 MG tablet, Take 1 tablet by mouth 2 (two) times daily as needed for anxiety., Disp: , Rfl: 3 .  aspirin EC 81 MG tablet, Take 81 mg by mouth daily., Disp: , Rfl:  .  citalopram (CELEXA) 20 MG tablet,  Take 1 tablet by mouth daily., Disp: , Rfl: 1 .  Dapagliflozin-metFORMIN HCl ER (XIGDUO XR) 07-998 MG TB24, Take 1 tablet by mouth 2 (two) times daily., Disp: 60 tablet, Rfl: 5 .  hydrOXYzine (ATARAX/VISTARIL) 25 MG tablet, Take 1 tablet (25 mg total) by mouth 3 (three) times daily as needed for anxiety., Disp: 90 tablet, Rfl: 1 .  LORazepam (ATIVAN) 0.5 MG tablet, TK 1 T PO  BID, Disp: , Rfl: 1 .  rosuvastatin (CRESTOR) 5 MG tablet, Take 5 mg by mouth daily., Disp: , Rfl: 1 .  spironolactone (ALDACTONE) 25 MG tablet, Take 1 tablet (25 mg total) by mouth daily., Disp: 30 tablet, Rfl: 3 .  SPRAVATO, 56 MG DOSE, 28 MG/DEVICE SOPK, 1 Dose 2 (two) times a week., Disp: , Rfl:  .  Vitamin D, Ergocalciferol, (DRISDOL) 50000 units CAPS capsule, Take 50,000 Units by mouth every Friday., Disp: , Rfl:  .  zolpidem (AMBIEN) 10 MG tablet, Take 1 tablet by mouth at bedtime., Disp: , Rfl: 3   Patient Care Team: Virginia Crews, MD as PCP - General (Family Medicine)      Objective:   Vitals: BP (!) 138/98 (BP Location: Right Arm, Patient Position: Sitting, Cuff Size: Large)   Pulse 88   Temp 98.3 F (36.8 C) (Oral)   Ht 5\' 8"  (1.727 m)   Wt 276 lb 3.2 oz (125.3 kg)   SpO2 98%   BMI 42.00 kg/m    Vitals:   11/18/17 1107    BP: (!) 138/98  Pulse: 88  Temp: 98.3 F (36.8 C)  TempSrc: Oral  SpO2: 98%  Weight: 276 lb 3.2 oz (125.3 kg)  Height: 5\' 8"  (1.727 m)     Physical Exam  Constitutional: She is oriented to person, place, and time. She appears well-developed and well-nourished. No distress.  HENT:  Head: Normocephalic and atraumatic.  Right Ear: External ear normal.  Left Ear: External ear normal.  Nose: Nose normal.  Mouth/Throat: Oropharynx is clear and moist.  Eyes: Pupils are equal, round, and reactive to light. Conjunctivae and EOM are normal. No scleral icterus.  Neck: Neck supple. No thyromegaly present.  Cardiovascular: Normal rate, regular rhythm, normal heart sounds and intact distal pulses.  No murmur heard. Pulmonary/Chest: Effort normal and breath sounds normal. No respiratory distress. She has no wheezes. She has no rales.  Abdominal: Soft. Bowel sounds are normal. She exhibits no distension. There is no tenderness. There is no rebound and no guarding.  Musculoskeletal: She exhibits no edema or deformity.  Lymphadenopathy:    She has no cervical adenopathy.  Neurological: She is alert and oriented to person, place, and time.  Skin: Skin is warm and dry. Capillary refill takes less than 2 seconds. No rash noted.  Psychiatric: She has a normal mood and affect. Her behavior is normal.  Vitals reviewed.   Diabetic Foot Exam - Simple   Simple Foot Form Diabetic Foot exam was performed with the following findings:  Yes 11/18/2017 12:37 PM  Visual Inspection No deformities, no ulcerations, no other skin breakdown bilaterally:  Yes Sensation Testing Intact to touch and monofilament testing bilaterally:  Yes Pulse Check Posterior Tibialis and Dorsalis pulse intact bilaterally:  Yes Comments      Depression Screen PHQ 2/9 Scores 11/18/2017 10/21/2017  PHQ - 2 Score 5 6  PHQ- 9 Score 20 25     Assessment & Plan:     Routine Health Maintenance and Physical Exam  Exercise  Activities  and Dietary recommendations Goals   None     Immunization History  Administered Date(s) Administered  . Influenza,inj,Quad PF,6+ Mos 10/21/2017  . Influenza-Unspecified 02/07/2015  . Pneumococcal Polysaccharide-23 10/21/2017  . Tdap 04/22/2016    Health Maintenance  Topic Date Due  . FOOT EXAM  11/23/1976  . OPHTHALMOLOGY EXAM  11/23/1976  . PAP SMEAR  11/24/1987  . MAMMOGRAM  09/30/2017  . HEMOGLOBIN A1C  03/13/2018  . URINE MICROALBUMIN  10/22/2018  . COLONOSCOPY  07/22/2025  . TETANUS/TDAP  04/22/2026  . INFLUENZA VACCINE  Completed  . PNEUMOCOCCAL POLYSACCHARIDE VACCINE AGE 59-64 HIGH RISK  Completed  . HIV Screening  Completed     Discussed health benefits of physical activity, and encouraged her to engage in regular exercise appropriate for her age and condition.    --------------------------------------------------------------------  Problem List Items Addressed This Visit    None    Visit Diagnoses    Encounter for annual physical exam    -  Primary   Cervical cancer screening       Relevant Orders   Cytology - PAP   Breast cancer screening       Relevant Orders   MM DIAG BREAST TOMO BILATERAL       Return in about 4 weeks (around 12/16/2017) for as scheduled for chronic disease f/u.   The entirety of the information documented in the History of Present Illness, Review of Systems and Physical Exam were personally obtained by me. Portions of this information were initially documented by Tiburcio Pea, CMA and reviewed by me for thoroughness and accuracy.    Virginia Crews, MD, MPH [facility] 11/18/2017 12:38 PM

## 2017-11-18 NOTE — Patient Instructions (Signed)
Preventive Care 40-64 Years, Female Preventive care refers to lifestyle choices and visits with your health care provider that can promote health and wellness. What does preventive care include?  A yearly physical exam. This is also called an annual well check.  Dental exams once or twice a year.  Routine eye exams. Ask your health care provider how often you should have your eyes checked.  Personal lifestyle choices, including: ? Daily care of your teeth and gums. ? Regular physical activity. ? Eating a healthy diet. ? Avoiding tobacco and drug use. ? Limiting alcohol use. ? Practicing safe sex. ? Taking low-dose aspirin daily starting at age 58. ? Taking vitamin and mineral supplements as recommended by your health care provider. What happens during an annual well check? The services and screenings done by your health care provider during your annual well check will depend on your age, overall health, lifestyle risk factors, and family history of disease. Counseling Your health care provider may ask you questions about your:  Alcohol use.  Tobacco use.  Drug use.  Emotional well-being.  Home and relationship well-being.  Sexual activity.  Eating habits.  Work and work Statistician.  Method of birth control.  Menstrual cycle.  Pregnancy history.  Screening You may have the following tests or measurements:  Height, weight, and BMI.  Blood pressure.  Lipid and cholesterol levels. These may be checked every 5 years, or more frequently if you are over 81 years old.  Skin check.  Lung cancer screening. You may have this screening every year starting at age 78 if you have a 30-pack-year history of smoking and currently smoke or have quit within the past 15 years.  Fecal occult blood test (FOBT) of the stool. You may have this test every year starting at age 65.  Flexible sigmoidoscopy or colonoscopy. You may have a sigmoidoscopy every 5 years or a colonoscopy  every 10 years starting at age 30.  Hepatitis C blood test.  Hepatitis B blood test.  Sexually transmitted disease (STD) testing.  Diabetes screening. This is done by checking your blood sugar (glucose) after you have not eaten for a while (fasting). You may have this done every 1-3 years.  Mammogram. This may be done every 1-2 years. Talk to your health care provider about when you should start having regular mammograms. This may depend on whether you have a family history of breast cancer.  BRCA-related cancer screening. This may be done if you have a family history of breast, ovarian, tubal, or peritoneal cancers.  Pelvic exam and Pap test. This may be done every 3 years starting at age 80. Starting at age 36, this may be done every 5 years if you have a Pap test in combination with an HPV test.  Bone density scan. This is done to screen for osteoporosis. You may have this scan if you are at high risk for osteoporosis.  Discuss your test results, treatment options, and if necessary, the need for more tests with your health care provider. Vaccines Your health care provider may recommend certain vaccines, such as:  Influenza vaccine. This is recommended every year.  Tetanus, diphtheria, and acellular pertussis (Tdap, Td) vaccine. You may need a Td booster every 10 years.  Varicella vaccine. You may need this if you have not been vaccinated.  Zoster vaccine. You may need this after age 5.  Measles, mumps, and rubella (MMR) vaccine. You may need at least one dose of MMR if you were born in  1957 or later. You may also need a second dose.  Pneumococcal 13-valent conjugate (PCV13) vaccine. You may need this if you have certain conditions and were not previously vaccinated.  Pneumococcal polysaccharide (PPSV23) vaccine. You may need one or two doses if you smoke cigarettes or if you have certain conditions.  Meningococcal vaccine. You may need this if you have certain  conditions.  Hepatitis A vaccine. You may need this if you have certain conditions or if you travel or work in places where you may be exposed to hepatitis A.  Hepatitis B vaccine. You may need this if you have certain conditions or if you travel or work in places where you may be exposed to hepatitis B.  Haemophilus influenzae type b (Hib) vaccine. You may need this if you have certain conditions.  Talk to your health care provider about which screenings and vaccines you need and how often you need them. This information is not intended to replace advice given to you by your health care provider. Make sure you discuss any questions you have with your health care provider. Document Released: 03/16/2015 Document Revised: 11/07/2015 Document Reviewed: 12/19/2014 Elsevier Interactive Patient Education  2018 [employer] Inc[employer]  

## 2017-11-20 LAB — CYTOLOGY - PAP
Candida vaginitis: NEGATIVE
Diagnosis: NEGATIVE
HPV (WINDOPATH): NOT DETECTED

## 2017-11-27 LAB — HM DIABETES EYE EXAM

## 2017-12-16 ENCOUNTER — Ambulatory Visit: Payer: Self-pay | Admitting: Family Medicine

## 2017-12-17 ENCOUNTER — Encounter: Payer: Self-pay | Admitting: Family Medicine

## 2017-12-17 ENCOUNTER — Ambulatory Visit (INDEPENDENT_AMBULATORY_CARE_PROVIDER_SITE_OTHER): Payer: BC Managed Care – PPO | Admitting: Family Medicine

## 2017-12-17 VITALS — BP 138/84 | HR 71 | Temp 97.8°F | Wt 281.6 lb

## 2017-12-17 DIAGNOSIS — I10 Essential (primary) hypertension: Secondary | ICD-10-CM

## 2017-12-17 DIAGNOSIS — E119 Type 2 diabetes mellitus without complications: Secondary | ICD-10-CM | POA: Diagnosis not present

## 2017-12-17 DIAGNOSIS — L680 Hirsutism: Secondary | ICD-10-CM

## 2017-12-17 DIAGNOSIS — M791 Myalgia, unspecified site: Secondary | ICD-10-CM

## 2017-12-17 DIAGNOSIS — E559 Vitamin D deficiency, unspecified: Secondary | ICD-10-CM

## 2017-12-17 DIAGNOSIS — E1169 Type 2 diabetes mellitus with other specified complication: Secondary | ICD-10-CM | POA: Diagnosis not present

## 2017-12-17 DIAGNOSIS — E538 Deficiency of other specified B group vitamins: Secondary | ICD-10-CM

## 2017-12-17 DIAGNOSIS — E785 Hyperlipidemia, unspecified: Secondary | ICD-10-CM

## 2017-12-17 LAB — POCT GLYCOSYLATED HEMOGLOBIN (HGB A1C): HEMOGLOBIN A1C: 7.8 % — AB (ref 4.0–5.6)

## 2017-12-17 MED ORDER — SPIRONOLACTONE 50 MG PO TABS: 50.0000 mg | ORAL_TABLET | Freq: Every day | ORAL | 5 refills | Status: DC

## 2017-12-17 NOTE — Assessment & Plan Note (Signed)
Discussed importance of diet and exercise 

## 2017-12-17 NOTE — Patient Instructions (Signed)

## 2017-12-17 NOTE — Assessment & Plan Note (Signed)
Well-controlled with diet Also helped by spironolactone for PCOS and hirsutism At goal, but can tolerate increase in spironolactone Recheck CMP

## 2017-12-17 NOTE — Assessment & Plan Note (Signed)
Reviewed recent lipid panel, which was not at goal, the patient had been off of her medicine for about 6 weeks We will recheck fasting lipid panel and CMP today Check CK to see if myalgias may be from statin Continue 5 mg Crestor daily If continues to have myalgia's and/or CK is elevated, consider decreasing dose of Crestor to every other day

## 2017-12-17 NOTE — Assessment & Plan Note (Signed)
Improving with A1c 7.8 today Not checking home blood sugars Continue current medications as A1c elevation may be related to being off of her medication for about 6 weeks in the last 3 months She is taking with good compliance now, but just did not have a physician to refill it previously We will recheck A1c in 3 months If A1c is not well controlled at that time, consider addition of GLP-1 Up-to-date on vaccinations, eye exam, foot exam On statin Discussed diet and exercise

## 2017-12-17 NOTE — Progress Notes (Signed)
Patient: Bonnie Garcia Female    DOB: 1965/03/06   52 y.o.   MRN: [id number] Visit Date: 12/17/2017  Today's Provider: Lavon Paganini, MD   Chief Complaint  Patient presents with  . Diabetes  . Hypertension  . Hyperlipidemia   Subjective:    HPI  Diabetes Mellitus Type II, Follow-up:   Lab Results  Component Value Date   HGBA1C 8.1 (H) 09/10/2017   Last seen for diabetes 2 months ago.  Management since then includes no changes. She reports good compliance with treatment.  Except that she was out of medications for ~6 weeks prior to our initial visit in 10/2017.   She is not having side effects.  Current symptoms include none  Home blood sugar records: fasting range: unknown patient is not checking  Episodes of hypoglycemia? unknown   Current Insulin Regimen: none Most Recent Eye Exam: 11/27/2017 Weight trend: stable Prior visit with dietician: no Current diet: in general, an "unhealthy" diet Current exercise: none  ------------------------------------------------------------------------   Hypertension, follow-up:  BP Readings from Last 3 Encounters:  12/17/17 138/84  11/18/17 (!) 138/98  10/21/17 136/90    She was last seen for hypertension 2 months ago.  BP at that visit was 136/90. Management since that visit includes no changes.She reports good compliance with treatment. She is not having side effects.  She is not exercising. She is not adherent to low salt diet.   Outside blood pressures are not being checked. She is experiencing none.  Patient denies chest pain, chest pressure/discomfort, claudication, dyspnea, exertional chest pressure/discomfort, fatigue, irregular heart beat, lower extremity edema, near-syncope, orthopnea, palpitations, paroxysmal nocturnal dyspnea, syncope and tachypnea.   Cardiovascular risk factors include diabetes mellitus, dyslipidemia, hypertension and obesity (BMI >= 30 kg/m2).  Use of agents associated with  hypertension: none.   ------------------------------------------------------------------------   Lipid/Cholesterol, Follow-up:   Last seen for this 2 months ago.  Management since that visit includes no changes.  Last Lipid Panel:    Component Value Date/Time   CHOL 201 (H) 09/10/2017 1858   TRIG 227 (H) 09/10/2017 1858   HDL 47 09/10/2017 1858   CHOLHDL 4.3 09/10/2017 1858   VLDL 45 (H) 09/10/2017 1858   LDLCALC 109 (H) 09/10/2017 1858    She reports good compliance with treatment. She is having side effects. She complains of myalgias for a few weeks, diffusely over her legs.  She is wondering if this is related to Crestor.  She also wonders about magnesium, potassium, and B21 levels  Wt Readings from Last 3 Encounters:  12/17/17 281 lb 9.6 oz (127.7 kg)  11/18/17 276 lb 3.2 oz (125.3 kg)  10/21/17 273 lb (123.8 kg)    ------------------------------------------------------------------------ Patient is taking Spironolactone for hirsutism 2/2 PCOS.  She denies any side effects.     Allergies  Allergen Reactions  . Erythromycin Other (See Comments)    Reaction:  GI upset   . Penicillins Hives and Other (See Comments)    Has patient had a PCN reaction causing immediate rash, facial/tongue/throat swelling, SOB or lightheadedness with hypotension: No Has patient had a PCN reaction causing severe rash involving mucus membranes or skin necrosis: No Has patient had a PCN reaction that required hospitalization No Has patient had a PCN reaction occurring within the last 10 years: No If all of the above answers are "NO", then may proceed with Cephalosporin use.     Current Outpatient Medications:  .  ALPRAZolam (XANAX) 0.5 MG tablet, Take  1 tablet by mouth 2 (two) times daily as needed for anxiety., Disp: , Rfl: 3 .  aspirin EC 81 MG tablet, Take 81 mg by mouth daily., Disp: , Rfl:  .  citalopram (CELEXA) 20 MG tablet, Take 1 tablet by mouth daily., Disp: , Rfl: 1 .   Dapagliflozin-metFORMIN HCl ER (XIGDUO XR) 07-998 MG TB24, Take 1 tablet by mouth 2 (two) times daily., Disp: 60 tablet, Rfl: 5 .  hydrOXYzine (ATARAX/VISTARIL) 25 MG tablet, Take 1 tablet (25 mg total) by mouth 3 (three) times daily as needed for anxiety., Disp: 90 tablet, Rfl: 1 .  LORazepam (ATIVAN) 0.5 MG tablet, TK 1 T PO  BID, Disp: , Rfl: 1 .  rosuvastatin (CRESTOR) 5 MG tablet, Take 5 mg by mouth daily., Disp: , Rfl: 1 .  spironolactone (ALDACTONE) 25 MG tablet, Take 1 tablet (25 mg total) by mouth daily., Disp: 30 tablet, Rfl: 3 .  SPRAVATO, 56 MG DOSE, 28 MG/DEVICE SOPK, 1 Dose 2 (two) times a week., Disp: , Rfl:  .  Vitamin D, Ergocalciferol, (DRISDOL) 50000 units CAPS capsule, Take 50,000 Units by mouth every Friday., Disp: , Rfl:  .  zolpidem (AMBIEN) 10 MG tablet, Take 1 tablet by mouth at bedtime., Disp: , Rfl: 3  Review of Systems  Constitutional: Negative.   HENT: Negative.   Respiratory: Negative.   Cardiovascular: Negative.   Gastrointestinal: Negative.   Musculoskeletal: Positive for myalgias. Negative for gait problem and joint swelling.  Skin: Negative.   Psychiatric/Behavioral: Positive for dysphoric mood. The patient is nervous/anxious.     Social History   Tobacco Use  . Smoking status: Former Smoker    Packs/day: 1.00    Years: 15.00    Pack years: 15.00    Types: Cigarettes    Last attempt to quit: 2009    Years since quitting: 10.7  . Smokeless tobacco: Never Used  Substance Use Topics  . Alcohol use: Yes    Comment: socially, not even monthly   Objective:   BP 138/84 (BP Location: Left Arm, Patient Position: Sitting, Cuff Size: Large)   Pulse 71   Temp 97.8 F (36.6 C) (Oral)   Wt 281 lb 9.6 oz (127.7 kg)   SpO2 98%   BMI 42.82 kg/m  Vitals:   12/17/17 0838  BP: 138/84  Pulse: 71  Temp: 97.8 F (36.6 C)  TempSrc: Oral  SpO2: 98%  Weight: 281 lb 9.6 oz (127.7 kg)     Physical Exam  Constitutional: She is oriented to person, place,  and time. She appears well-developed and well-nourished. No distress.  HENT:  Head: Normocephalic and atraumatic.  Mouth/Throat: Oropharynx is clear and moist.  Eyes: Conjunctivae are normal. No scleral icterus.  Neck: Neck supple. No thyromegaly present.  Cardiovascular: Normal rate, regular rhythm, normal heart sounds and intact distal pulses.  No murmur heard. Pulmonary/Chest: Effort normal and breath sounds normal. No respiratory distress. She has no wheezes. She has no rales.  Abdominal: Soft. She exhibits no distension. There is no tenderness.  Musculoskeletal: She exhibits no edema.  Lymphadenopathy:    She has no cervical adenopathy.  Neurological: She is alert and oriented to person, place, and time.  Skin: Skin is warm and dry. Capillary refill takes less than 2 seconds. No rash noted.  Vitals reviewed.       Assessment & Plan:   Problem List Items Addressed This Visit      Cardiovascular and Mediastinum   Hypertension - Primary  Well-controlled with diet Also helped by spironolactone for PCOS and hirsutism At goal, but can tolerate increase in spironolactone Recheck CMP      Relevant Medications   spironolactone (ALDACTONE) 50 MG tablet   Other Relevant Orders   Comprehensive metabolic panel     Endocrine   Diabetes mellitus without complication (HCC)    Improving with A1c 7.8 today Not checking home blood sugars Continue current medications as A1c elevation may be related to being off of her medication for about 6 weeks in the last 3 months She is taking with good compliance now, but just did not have a physician to refill it previously We will recheck A1c in 3 months If A1c is not well controlled at that time, consider addition of GLP-1 Up-to-date on vaccinations, eye exam, foot exam On statin Discussed diet and exercise       Relevant Orders   POCT HgB A1C (Completed)   Hyperlipidemia associated with type 2 diabetes mellitus ([place])    Reviewed  recent lipid panel, which was not at goal, the patient had been off of her medicine for about 6 weeks We will recheck fasting lipid panel and CMP today Check CK to see if myalgias may be from statin Continue 5 mg Crestor daily If continues to have myalgia's and/or CK is elevated, consider decreasing dose of Crestor to every other day      Relevant Orders   Lipid panel   Comprehensive metabolic panel     Musculoskeletal and Integument   Hirsutism    Secondary to PCOS Increase spironolactone to 50 mg daily Discussed it can take about 6 months to see full effects of this medication        Other   B12 deficiency   Relevant Orders   B12   Morbid obesity ([place])    Discussed importance of diet and exercise      Relevant Orders   CBC   Myalgia    Possibly statin induced, but could also be related to hypomagnesemia, hypokalemia, or B12 deficiency We will check labs including CK and then determine next steps      Relevant Orders   CK (Creatine Kinase)   Magnesium   B12    Other Visit Diagnoses    Avitaminosis D       Relevant Orders   VITAMIN D 25 Hydroxy (Vit-D Deficiency, Fractures)       Return in about 3 months (around 03/19/2018) for chronic disease f/u.   The entirety of the information documented in the History of Present Illness, Review of Systems and Physical Exam were personally obtained by me. Portions of this information were initially documented by Tiburcio Pea, CMA and reviewed by me for thoroughness and accuracy.    Virginia Crews, MD, MPH [facility] 12/17/2017 11:43 AM

## 2017-12-17 NOTE — Assessment & Plan Note (Signed)
Secondary to PCOS Increase spironolactone to 50 mg daily Discussed it can take about 6 months to see full effects of this medication

## 2017-12-17 NOTE — Assessment & Plan Note (Signed)
Possibly statin induced, but could also be related to hypomagnesemia, hypokalemia, or B12 deficiency We will check labs including CK and then determine next steps

## 2017-12-18 LAB — COMPREHENSIVE METABOLIC PANEL
ALBUMIN: 4.6 g/dL (ref 3.5–5.5)
ALK PHOS: 52 IU/L (ref 39–117)
ALT: 19 IU/L (ref 0–32)
AST: 17 IU/L (ref 0–40)
Albumin/Globulin Ratio: 1.8 (ref 1.2–2.2)
BILIRUBIN TOTAL: 0.2 mg/dL (ref 0.0–1.2)
BUN / CREAT RATIO: 7 — AB (ref 9–23)
BUN: 5 mg/dL — AB (ref 6–24)
CHLORIDE: 99 mmol/L (ref 96–106)
CO2: 19 mmol/L — ABNORMAL LOW (ref 20–29)
CREATININE: 0.68 mg/dL (ref 0.57–1.00)
Calcium: 9.8 mg/dL (ref 8.7–10.2)
GFR calc Af Amer: 117 mL/min/{1.73_m2} (ref >59)
GFR calc non Af Amer: 102 mL/min/{1.73_m2} (ref >59)
Globulin, Total: 2.5 g/dL (ref 1.5–4.5)
Glucose: 177 mg/dL — ABNORMAL HIGH (ref 65–99)
Potassium: 4.2 mmol/L (ref 3.5–5.2)
SODIUM: 139 mmol/L (ref 134–144)
Total Protein: 7.1 g/dL (ref 6.0–8.5)

## 2017-12-18 LAB — LIPID PANEL
Chol/HDL Ratio: 4.1 ratio (ref 0.0–4.4)
Cholesterol, Total: 180 mg/dL (ref 100–199)
HDL: 44 mg/dL (ref >39)
LDL CALC: 87 mg/dL (ref 0–99)
Triglycerides: 245 mg/dL — ABNORMAL HIGH (ref 0–149)
VLDL CHOLESTEROL CAL: 49 mg/dL — AB (ref 5–40)

## 2017-12-18 LAB — VITAMIN D 25 HYDROXY (VIT D DEFICIENCY, FRACTURES): VIT D 25 HYDROXY: 22.8 ng/mL — AB (ref 30.0–100.0)

## 2017-12-18 LAB — CK: CK TOTAL: 47 U/L (ref 24–173)

## 2017-12-18 LAB — CBC
Hematocrit: 49.2 % — ABNORMAL HIGH (ref 34.0–46.6)
Hemoglobin: 16.3 g/dL — ABNORMAL HIGH (ref 11.1–15.9)
MCH: 29.4 pg (ref 26.6–33.0)
MCHC: 33.1 g/dL (ref 31.5–35.7)
MCV: 89 fL (ref 79–97)
PLATELETS: 440 10*3/uL (ref 150–450)
RBC: 5.55 x10E6/uL — ABNORMAL HIGH (ref 3.77–5.28)
RDW: 12.4 % (ref 12.3–15.4)
WBC: 9.9 10*3/uL (ref 3.4–10.8)

## 2017-12-18 LAB — MAGNESIUM: MAGNESIUM: 1.7 mg/dL (ref 1.6–2.3)

## 2017-12-18 LAB — VITAMIN B12: VITAMIN B 12: 298 pg/mL (ref 232–1245)

## 2017-12-21 ENCOUNTER — Telehealth: Payer: Self-pay

## 2017-12-21 NOTE — Telephone Encounter (Signed)
LVMTRC.,PC 

## 2017-12-21 NOTE — Telephone Encounter (Signed)
-----   Message from Virginia Crews, MD sent at 12/21/2017  8:59 AM EDT ----- Cholesterol looks good.  It has decreased significantly, but is not quite to goal of LDL less than 70.  We will continue current dose.  No signs of statin induced leg pain with a normal CK.  Normal kidney function, liver function, electrolytes.  Blood sugar is elevated.  Normal magnesium and B12 levels.  Stable blood counts.  Vitamin D level is slightly low.  Recommend over-the-counter supplementation with vitamin D3 2000 units daily.  Virginia Crews, MD, MPH [facility] 12/21/2017 8:59 AM

## 2017-12-21 NOTE — Telephone Encounter (Signed)
Patient returned call and was advised.,PC

## 2018-01-04 ENCOUNTER — Other Ambulatory Visit: Payer: Self-pay

## 2018-01-04 MED ORDER — ROSUVASTATIN CALCIUM 5 MG PO TABS: 5.0000 mg | ORAL_TABLET | Freq: Every day | ORAL | 1 refills | Status: DC

## 2018-01-04 MED ORDER — VITAMIN D (ERGOCALCIFEROL) 1.25 MG (50000 UNIT) PO CAPS: 50000.0000 [IU] | ORAL_CAPSULE | ORAL | 1 refills | Status: DC

## 2018-03-23 ENCOUNTER — Ambulatory Visit (INDEPENDENT_AMBULATORY_CARE_PROVIDER_SITE_OTHER): Payer: BC Managed Care – PPO | Admitting: Family Medicine

## 2018-03-23 ENCOUNTER — Other Ambulatory Visit: Payer: Self-pay | Admitting: Family Medicine

## 2018-03-23 VITALS — BP 131/84 | HR 78 | Temp 98.0°F | Resp 16 | Wt 286.0 lb

## 2018-03-23 DIAGNOSIS — E1169 Type 2 diabetes mellitus with other specified complication: Secondary | ICD-10-CM

## 2018-03-23 DIAGNOSIS — E785 Hyperlipidemia, unspecified: Secondary | ICD-10-CM | POA: Diagnosis not present

## 2018-03-23 DIAGNOSIS — E119 Type 2 diabetes mellitus without complications: Secondary | ICD-10-CM | POA: Diagnosis not present

## 2018-03-23 DIAGNOSIS — I10 Essential (primary) hypertension: Secondary | ICD-10-CM

## 2018-03-23 LAB — POCT GLYCOSYLATED HEMOGLOBIN (HGB A1C)
ESTIMATED AVERAGE GLUCOSE: 180
Hemoglobin A1C: 7.9 % — AB (ref 4.0–5.6)

## 2018-03-23 MED ORDER — CITALOPRAM HYDROBROMIDE 40 MG PO TABS: 20.0000 mg | ORAL_TABLET | Freq: Every day | ORAL | 3 refills | Status: DC

## 2018-03-23 MED ORDER — ONDANSETRON HCL 4 MG PO TABS: 4.0000 mg | ORAL_TABLET | Freq: Three times a day (TID) | ORAL | 3 refills | Status: DC | PRN

## 2018-03-23 MED ORDER — DAPAGLIFLOZIN PRO-METFORMIN ER 5-1000 MG PO TB24: 1.0000 | ORAL_TABLET | Freq: Two times a day (BID) | ORAL | 5 refills | Status: DC

## 2018-03-23 MED ORDER — DULAGLUTIDE 1.5 MG/0.5ML ~~LOC~~ SOAJ: 1.5000 mg | SUBCUTANEOUS | 5 refills | Status: DC

## 2018-03-23 MED ORDER — CITALOPRAM HYDROBROMIDE 40 MG PO TABS: 40.0000 mg | ORAL_TABLET | Freq: Every day | ORAL | 3 refills | Status: DC

## 2018-03-23 NOTE — Telephone Encounter (Signed)
°  Pt needing a refill on:  rosuvastatin (CRESTOR) 5 MG tablet spironolactone (ALDACTONE) 50 MG tablet  Please fill at:  [id number], [place] [address] AT [place] [phone] (Phone) [phone] (Fax)   Thanks, TGh

## 2018-03-23 NOTE — Assessment & Plan Note (Signed)
Uncontrolled A1c 7.9 Continue Xigduo at max dose Add trulicity First dose of 0.75 given today in office with another sample to use next week Then increase to 1.5mg  dose if tolerating well Discussed possible side effects and that it can help with weight loss also.

## 2018-03-23 NOTE — Patient Instructions (Addendum)
CoQ10 100-200mg  daily to help with leg pain  Add Trulicity for diabetes once weekly

## 2018-03-23 NOTE — Assessment & Plan Note (Signed)
Lipids improving Continue crestor Discussed adding CoQ10 to help with leg cramps Could consider decreasing dose to qod

## 2018-03-23 NOTE — Progress Notes (Signed)
Patient: Bonnie Garcia Female    DOB: [birth year]   52 y.o.   MRN: [id number] Visit Date: 03/23/2018  Today's Provider: Lavon Paganini, MD   Chief Complaint  Patient presents with  . Follow-up  . Diabetes  . Hypertension  . Hyperlipidemia   Subjective:     HPI    Diabetes Mellitus Type II, Follow-up:   Lab Results  Component Value Date   HGBA1C 7.9 (A) 03/23/2018   HGBA1C 7.8 (A) 12/17/2017   HGBA1C 8.1 (H) 09/10/2017   Last seen for diabetes 3 months ago.  Management since then includes; labs checked, no changes. She reports excellent compliance with treatment. She is not having side effects. Current symptoms include none  Home blood sugar records: not checking  Episodes of hypoglycemia? no   Current Insulin Regimen: n/a Most Recent Eye Exam: 12/01/17 Weight trend: stable Prior visit with dietician: no Current diet: in general, an "unhealthy" diet Current exercise: none  ------------------------------------------------------------------------   Hypertension, follow-up:  BP Readings from Last 3 Encounters:  03/23/18 131/84  12/17/17 138/84  11/18/17 (!) 138/98    She was last seen for hypertension 3 months ago.  BP at that visit was 138/84. Management since that visit includes; labs checked, no changes .She reports good compliance with treatment. She is not having side effects. none She is not exercising. She is not adherent to low salt diet.   Outside blood pressures are not checking. She is experiencing none.  Patient denies none.   Cardiovascular risk factors include diabetes mellitus.  Use of agents associated with hypertension: none.   ------------------------------------------------------------------------    Lipid/Cholesterol, Follow-up:   Last seen for this 3 months ago.  Management since that visit includes; labs checked, no changes .  Last Lipid Panel:    Component Value Date/Time   CHOL 180 12/17/2017 0924   TRIG 245 (H) 12/17/2017 0924   HDL 44 12/17/2017 0924   CHOLHDL 4.1 12/17/2017 0924   CHOLHDL 4.3 09/10/2017 1858   VLDL 45 (H) 09/10/2017 1858   LDLCALC 87 12/17/2017 0924    She reports good compliance with treatment. She is not having side effects. none  Wt Readings from Last 3 Encounters:  03/23/18 286 lb (129.7 kg)  12/17/17 281 lb 9.6 oz (127.7 kg)  11/18/17 276 lb 3.2 oz (125.3 kg)    ------------------------------------------------------------------------    Allergies  Allergen Reactions  . Erythromycin Other (See Comments)    Reaction:  GI upset   . Penicillins Hives and Other (See Comments)    Has patient had a PCN reaction causing immediate rash, facial/tongue/throat swelling, SOB or lightheadedness with hypotension: No Has patient had a PCN reaction causing severe rash involving mucus membranes or skin necrosis: No Has patient had a PCN reaction that required hospitalization No Has patient had a PCN reaction occurring within the last 10 years: No If all of the above answers are "NO", then may proceed with Cephalosporin use.     Current Outpatient Medications:  .  aspirin EC 81 MG tablet, Take 81 mg by mouth daily., Disp: , Rfl:  .  citalopram (CELEXA) 20 MG tablet, Take 1 tablet by mouth daily., Disp: , Rfl: 1 .  Dapagliflozin-metFORMIN HCl ER (XIGDUO XR) 07-998 MG TB24, Take 1 tablet by mouth 2 (two) times daily., Disp: 60 tablet, Rfl: 5 .  LORazepam (ATIVAN) 0.5 MG tablet, TK 1 T PO  BID, Disp: , Rfl: 1 .  rosuvastatin (CRESTOR) 5 MG tablet,  Take 1 tablet (5 mg total) by mouth daily., Disp: 90 tablet, Rfl: 1 .  spironolactone (ALDACTONE) 50 MG tablet, Take 1 tablet (50 mg total) by mouth daily., Disp: 60 tablet, Rfl: 5 .  SPRAVATO, 56 MG DOSE, 28 MG/DEVICE SOPK, 1 Dose 2 (two) times a week., Disp: , Rfl:  .  Vitamin D, Ergocalciferol, (DRISDOL) 50000 units CAPS capsule, Take 1 capsule (50,000 Units total) by mouth every Friday., Disp: 12 capsule, Rfl: 1 .   zolpidem (AMBIEN) 10 MG tablet, Take 1 tablet by mouth at bedtime., Disp: , Rfl: 3 .  ALPRAZolam (XANAX) 0.5 MG tablet, Take 1 tablet by mouth 2 (two) times daily as needed for anxiety., Disp: , Rfl: 3 .  hydrOXYzine (ATARAX/VISTARIL) 25 MG tablet, Take 1 tablet (25 mg total) by mouth 3 (three) times daily as needed for anxiety. (Patient not taking: Reported on 03/23/2018), Disp: 90 tablet, Rfl: 1  Review of Systems  Constitutional: Negative for appetite change, chills, fatigue and fever.  Respiratory: Negative for chest tightness and shortness of breath.   Cardiovascular: Negative for chest pain and palpitations.  Gastrointestinal: Negative for abdominal pain, nausea and vomiting.  Neurological: Negative for dizziness and weakness.    Social History   Tobacco Use  . Smoking status: Former Smoker    Packs/day: 1.00    Years: 15.00    Pack years: 15.00    Types: Cigarettes    Last attempt to quit: 2009    Years since quitting: 11.0  . Smokeless tobacco: Never Used  Substance Use Topics  . Alcohol use: Yes    Comment: socially, not even monthly      Objective:   BP 131/84 (BP Location: Left Arm, Patient Position: Sitting, Cuff Size: Large)   Pulse 78   Temp 98 F (36.7 C) (Oral)   Resp 16   Wt 286 lb (129.7 kg)   SpO2 95%   BMI 43.49 kg/m  Vitals:   03/23/18 0842  BP: 131/84  Pulse: 78  Resp: 16  Temp: 98 F (36.7 C)  TempSrc: Oral  SpO2: 95%  Weight: 286 lb (129.7 kg)     Physical Exam Vitals signs reviewed.  Constitutional:      General: She is not in acute distress.    Appearance: She is obese. She is not diaphoretic.  HENT:     Head: Normocephalic and atraumatic.     Right Ear: External ear normal.     Left Ear: External ear normal.     Nose: Nose normal.     Mouth/Throat:     Pharynx: Oropharynx is clear.  Eyes:     General: No scleral icterus.    Conjunctiva/sclera: Conjunctivae normal.  Neck:     Musculoskeletal: Neck supple.  Cardiovascular:       Rate and Rhythm: Normal rate and regular rhythm.     Pulses: Normal pulses.     Heart sounds: Normal heart sounds.  Pulmonary:     Effort: Pulmonary effort is normal. No respiratory distress.     Breath sounds: Normal breath sounds. No wheezing or rhonchi.  Abdominal:     General: There is no distension.     Palpations: Abdomen is soft.     Tenderness: There is no abdominal tenderness.  Musculoskeletal:     Right lower leg: No edema.     Left lower leg: No edema.  Lymphadenopathy:     Cervical: No cervical adenopathy.  Skin:    General: Skin is warm and  dry.     Capillary Refill: Capillary refill takes less than 2 seconds.     Findings: No rash.  Neurological:     Mental Status: She is alert and oriented to person, place, and time. Mental status is at baseline.  Psychiatric:        Mood and Affect: Mood normal.        Behavior: Behavior normal.      Results for orders placed or performed in visit on 03/23/18  POCT glycosylated hemoglobin (Hb A1C)  Result Value Ref Range   Hemoglobin A1C 7.9 (A) 4.0 - 5.6 %   HbA1c POC (<> result, manual entry)     HbA1c, POC (prediabetic range)     HbA1c, POC (controlled diabetic range)     Est. average glucose Bld gHb Est-mCnc 180        Assessment & Plan    Problem List Items Addressed This Visit      Cardiovascular and Mediastinum   Hypertension    Well controlled with diet Continue spironolactone for PCOS and hirsutism        Endocrine   Diabetes mellitus without complication (HCC) - Primary    Uncontrolled A1c 7.9 Continue Xigduo at max dose Add trulicity First dose of 0.75 given today in office with another sample to use next week Then increase to 1.5mg  dose if tolerating well Discussed possible side effects and that it can help with weight loss also.      Relevant Medications   Dulaglutide (TRULICITY) 1.5 BD/5.3GD SOPN   Dapagliflozin-metFORMIN HCl ER (XIGDUO XR) 07-998 MG TB24   Other Relevant Orders    POCT glycosylated hemoglobin (Hb A1C) (Completed)   Hyperlipidemia associated with type 2 diabetes mellitus (HCC)    Lipids improving Continue crestor Discussed adding CoQ10 to help with leg cramps Could consider decreasing dose to qod      Relevant Medications   Dulaglutide (TRULICITY) 1.5 JM/4.2AS SOPN   Dapagliflozin-metFORMIN HCl ER (XIGDUO XR) 07-998 MG TB24       Return in about 3 months (around 06/22/2018) for diabetes f/u.   The entirety of the information documented in the History of Present Illness, Review of Systems and Physical Exam were personally obtained by me. Portions of this information were initially documented by Ival Bible, CMA and reviewed by me for thoroughness and accuracy.    Virginia Crews, MD, MPH [facility] 03/23/2018 11:51 AM

## 2018-03-23 NOTE — Assessment & Plan Note (Signed)
Well controlled with diet Continue spironolactone for PCOS and hirsutism

## 2018-03-24 MED ORDER — ROSUVASTATIN CALCIUM 5 MG PO TABS: 5.0000 mg | ORAL_TABLET | Freq: Every day | ORAL | 1 refills | Status: DC

## 2018-03-24 MED ORDER — SPIRONOLACTONE 50 MG PO TABS: 50.0000 mg | ORAL_TABLET | Freq: Every day | ORAL | 3 refills | Status: DC

## 2018-06-21 ENCOUNTER — Other Ambulatory Visit: Payer: Self-pay | Admitting: Family Medicine

## 2018-06-23 ENCOUNTER — Encounter: Payer: Self-pay | Admitting: Family Medicine

## 2018-06-23 ENCOUNTER — Ambulatory Visit (INDEPENDENT_AMBULATORY_CARE_PROVIDER_SITE_OTHER): Payer: BC Managed Care – PPO | Admitting: Family Medicine

## 2018-06-23 VITALS — BP 122/80

## 2018-06-23 DIAGNOSIS — I10 Essential (primary) hypertension: Secondary | ICD-10-CM

## 2018-06-23 DIAGNOSIS — E1169 Type 2 diabetes mellitus with other specified complication: Secondary | ICD-10-CM | POA: Diagnosis not present

## 2018-06-23 DIAGNOSIS — E1165 Type 2 diabetes mellitus with hyperglycemia: Secondary | ICD-10-CM | POA: Diagnosis not present

## 2018-06-23 DIAGNOSIS — E785 Hyperlipidemia, unspecified: Secondary | ICD-10-CM

## 2018-06-23 LAB — POCT GLYCOSYLATED HEMOGLOBIN (HGB A1C): Hemoglobin A1C: 7.9 % — AB (ref 4.0–5.6)

## 2018-06-23 MED ORDER — ROSUVASTATIN CALCIUM 5 MG PO TABS: 5.0000 mg | ORAL_TABLET | Freq: Every day | ORAL | 1 refills | Status: DC

## 2018-06-23 MED ORDER — SPIRONOLACTONE 50 MG PO TABS: 50.0000 mg | ORAL_TABLET | Freq: Every day | ORAL | 3 refills | Status: DC

## 2018-06-23 MED ORDER — GLUCOSE BLOOD VI STRP: ORAL_STRIP | 12 refills | Status: DC

## 2018-06-23 MED ORDER — GLIPIZIDE 5 MG PO TABS: 5.0000 mg | ORAL_TABLET | Freq: Every day | ORAL | 3 refills | Status: DC

## 2018-06-23 NOTE — Progress Notes (Signed)
Patient: Bonnie Garcia Female    DOB: 1965/10/15   53 y.o.   MRN: [id number] Visit Date: 06/23/2018  Today's Provider: Lavon Paganini, MD   Chief Complaint  Patient presents with  . Hypertension  . Diabetes   Subjective:    Virtual Visit via Video Note  I connected with Bonnie Garcia on 06/23/18 at  3:20 PM EDT by a video enabled telemedicine application and verified that I am speaking with the correct person using two identifiers.   I discussed the limitations of evaluation and management by telemedicine and the availability of in person appointments. The patient expressed understanding and agreed to proceed.   Patient location: home Provider location: [facility] Persons involved in the visit: patient, provider    HPI  Diabetes Mellitus Type II, Follow-up:   Lab Results  Component Value Date   HGBA1C 7.9 (A) 06/23/2018   HGBA1C 7.9 (A) 03/23/2018   HGBA1C 7.8 (A) 12/17/2017   Last seen for diabetes 3 months ago.  Management since then includes no changes, continues xigduo and Trulicity. She reports good compliance with treatment. She is not having side effects.  Current symptoms include none  Home blood sugar records: fasting range: 160-170's  Episodes of hypoglycemia? no   Current Insulin Regimen: added Trulicity Most Recent Eye Exam: 11/27/2017 Weight trend: stable Prior visit with dietician: no Current diet: in general, an "unhealthy" diet Current exercise: none  ------------------------------------------------------------------------   Hypertension, follow-up:  BP Readings from Last 3 Encounters:  06/23/18 122/80  03/23/18 131/84  12/17/17 138/84    She was last seen for hypertension 3 months ago.  BP at that visit was 131/84. Management since that visit includes no changes.She reports good compliance with treatment. She is not having side effects.  She is not exercising. She is not adherent to low salt diet.    Outside blood pressures are not being checked. She is experiencing none.  Patient denies chest pain, chest pressure/discomfort, claudication, dyspnea, exertional chest pressure/discomfort, fatigue, irregular heart beat, lower extremity edema, near-syncope, orthopnea, palpitations, paroxysmal nocturnal dyspnea, syncope and tachypnea.   Cardiovascular risk factors include diabetes mellitus, hypertension and obesity (BMI >= 30 kg/m2).  Use of agents associated with hypertension: none.   ------------------------------------------------------------------------   Allergies  Allergen Reactions  . Erythromycin Other (See Comments)    Reaction:  GI upset   . Penicillins Hives and Other (See Comments)    Has patient had a PCN reaction causing immediate rash, facial/tongue/throat swelling, SOB or lightheadedness with hypotension: No Has patient had a PCN reaction causing severe rash involving mucus membranes or skin necrosis: No Has patient had a PCN reaction that required hospitalization No Has patient had a PCN reaction occurring within the last 10 years: No If all of the above answers are "NO", then may proceed with Cephalosporin use.     Current Outpatient Medications:  .  ALPRAZolam (XANAX) 0.5 MG tablet, Take 1 tablet by mouth 2 (two) times daily as needed for anxiety., Disp: , Rfl: 3 .  aspirin EC 81 MG tablet, Take 81 mg by mouth daily., Disp: , Rfl:  .  citalopram (CELEXA) 40 MG tablet, Take 1 tablet (40 mg total) by mouth daily., Disp: 90 tablet, Rfl: 3 .  Dapagliflozin-metFORMIN HCl ER (XIGDUO XR) 07-998 MG TB24, Take 1 tablet by mouth 2 (two) times daily., Disp: 180 tablet, Rfl: 5 .  Dulaglutide (TRULICITY) 1.5 GM/0.1UU SOPN, Inject 1.5 mg into the skin once a week., Disp:  4 pen, Rfl: 5 .  hydrOXYzine (ATARAX/VISTARIL) 25 MG tablet, Take 1 tablet (25 mg total) by mouth 3 (three) times daily as needed for anxiety., Disp: 90 tablet, Rfl: 1 .  LORazepam (ATIVAN) 0.5 MG tablet, TK 1 T PO   BID, Disp: , Rfl: 1 .  ondansetron (ZOFRAN) 4 MG tablet, Take 1 tablet (4 mg total) by mouth every 8 (eight) hours as needed for nausea or vomiting., Disp: 20 tablet, Rfl: 3 .  rosuvastatin (CRESTOR) 5 MG tablet, Take 1 tablet (5 mg total) by mouth daily., Disp: 90 tablet, Rfl: 1 .  spironolactone (ALDACTONE) 50 MG tablet, Take 1 tablet (50 mg total) by mouth daily., Disp: 90 tablet, Rfl: 3 .  SPRAVATO, 56 MG DOSE, 28 MG/DEVICE SOPK, 1 Dose 2 (two) times a week., Disp: , Rfl:  .  Vitamin D, Ergocalciferol, (DRISDOL) 1.25 MG (50000 UT) CAPS capsule, TAKE 1 CAPSULE BY MOUTH EVERY FRIDAY, Disp: 12 capsule, Rfl: 1 .  glipiZIDE (GLUCOTROL) 5 MG tablet, Take 1 tablet (5 mg total) by mouth daily. Taken before largest meal of the day, Disp: 30 tablet, Rfl: 3 .  glucose blood test strip, Use as instructed to test blood sugar 2-3 times daily, Disp: 100 each, Rfl: 12 .  zolpidem (AMBIEN) 10 MG tablet, Take 1 tablet by mouth at bedtime., Disp: , Rfl: 3  Review of Systems  Constitutional: Negative.   Respiratory: Negative.   Cardiovascular: Negative.   Musculoskeletal: Negative.     Social History   Tobacco Use  . Smoking status: Former Smoker    Packs/day: 1.00    Years: 15.00    Pack years: 15.00    Types: Cigarettes    Last attempt to quit: 2009    Years since quitting: 11.3  . Smokeless tobacco: Never Used  Substance Use Topics  . Alcohol use: Yes    Comment: socially, not even monthly      Objective:   BP 122/80 (BP Location: Left Arm, Patient Position: Sitting, Cuff Size: Large)  Vitals:   06/23/18 1339  BP: 122/80     Physical Exam Vitals signs reviewed.  Constitutional:      Appearance: Normal appearance.  Pulmonary:     Effort: Pulmonary effort is normal. No respiratory distress.  Neurological:     Mental Status: She is alert and oriented to person, place, and time. Mental status is at baseline.  Psychiatric:        Mood and Affect: Mood normal.        Behavior:  Behavior normal.     Results for orders placed or performed in visit on 06/23/18  POCT HgB A1C  Result Value Ref Range   Hemoglobin A1C 7.9 (A) 4.0 - 5.6 %   HbA1c POC (<> result, manual entry)     HbA1c, POC (prediabetic range)     HbA1c, POC (controlled diabetic range)         Assessment & Plan      I discussed the assessment and treatment plan with the patient. The patient was provided an opportunity to ask questions and all were answered. The patient agreed with the plan and demonstrated an understanding of the instructions.   The patient was advised to call back or seek an in-person evaluation if the symptoms worsen or if the condition fails to improve as anticipated.  Problem List Items Addressed This Visit      Cardiovascular and Mediastinum   Hypertension    Well-controlled Continue spironolactone for both  blood pressure and PCOS/hirsutism Repeat metabolic panel at next in-person visit      Relevant Medications   spironolactone (ALDACTONE) 50 MG tablet   rosuvastatin (CRESTOR) 5 MG tablet     Endocrine   Diabetes mellitus without complication (HCC) - Primary    Chronic and uncontrolled A1c stable at 7.9 Discussed goal A1c less than 7 Continue Xigduo and Trulicity at current max doses Discussed need to add another class of medications We will add glipizide 5 mg daily before largest meal Discussed possible side effect of hypoglycemia Discussed importance of low-carb diet to lower A1c as we may have to consider insulin in her future if we cannot get this controlled with oral medications Up-to-date on screenings and vaccinations On statin Follow-up in 3 months and repeat A1c      Relevant Medications   glipiZIDE (GLUCOTROL) 5 MG tablet   rosuvastatin (CRESTOR) 5 MG tablet   Hyperlipidemia associated with type 2 diabetes mellitus (HCC)    Lipids were improving at last visit Continue Crestor Not endorsing any leg cramps at this time Repeat lipids at next visit       Relevant Medications   glipiZIDE (GLUCOTROL) 5 MG tablet   rosuvastatin (CRESTOR) 5 MG tablet       Return in about 3 months (around 09/22/2018) for Chronic disease follow-up-needs A1c.   The entirety of the information documented in the History of Present Illness, Review of Systems and Physical Exam were personally obtained by me. Portions of this information were initially documented by Tiburcio Pea, CMA and reviewed by me for thoroughness and accuracy.    Virginia Crews, MD, MPH [facility] 06/23/2018 3:23 PM

## 2018-06-23 NOTE — Assessment & Plan Note (Signed)
Lipids were improving at last visit Continue Crestor Not endorsing any leg cramps at this time Repeat lipids at next visit

## 2018-06-23 NOTE — Assessment & Plan Note (Signed)
Well-controlled Continue spironolactone for both blood pressure and PCOS/hirsutism Repeat metabolic panel at next in-person visit

## 2018-06-23 NOTE — Assessment & Plan Note (Signed)
Chronic and uncontrolled A1c stable at 7.9 Discussed goal A1c less than 7 Continue Xigduo and Trulicity at current max doses Discussed need to add another class of medications We will add glipizide 5 mg daily before largest meal Discussed possible side effect of hypoglycemia Discussed importance of low-carb diet to lower A1c as we may have to consider insulin in her future if we cannot get this controlled with oral medications Up-to-date on screenings and vaccinations On statin Follow-up in 3 months and repeat A1c

## 2018-09-01 ENCOUNTER — Other Ambulatory Visit: Payer: Self-pay | Admitting: Family Medicine

## 2018-09-15 ENCOUNTER — Telehealth: Payer: Self-pay | Admitting: Family Medicine

## 2018-09-15 NOTE — Telephone Encounter (Signed)
I can write a letter stating the conditions that she has and the control.  I can also include her medication list.  I will say that her psychiatric conditions are more severe and more likely to qualify her for long term disability, however.  It may be advantageous to get a letter from psych as well.

## 2018-09-15 NOTE — Telephone Encounter (Signed)
Please advise 

## 2018-09-15 NOTE — Telephone Encounter (Signed)
Pt needs a letter for long term disability for her medical problems.  Her Psychiatrist has ask that she get her primary dr to do this.  CB#  [phone]  teri

## 2018-09-16 ENCOUNTER — Encounter: Payer: Self-pay | Admitting: Family Medicine

## 2018-09-16 NOTE — Telephone Encounter (Signed)
Letter completed and can be left up front for pickup (I will need to sign this).  Please make sure she is informed as per last message also.

## 2018-09-16 NOTE — Telephone Encounter (Signed)
Patient was advised via voicemail per DRP.

## 2018-09-24 ENCOUNTER — Ambulatory Visit: Payer: Self-pay | Admitting: Family Medicine

## 2018-10-11 ENCOUNTER — Other Ambulatory Visit: Payer: Self-pay | Admitting: Family Medicine

## 2018-10-11 MED ORDER — GLIPIZIDE 5 MG PO TABS: 5.0000 mg | ORAL_TABLET | Freq: Every day | ORAL | 0 refills | Status: DC

## 2018-10-11 NOTE — Telephone Encounter (Signed)
[facility] faxed refill request for the following medications: ? ?glipiZIDE (GLUCOTROL) 5 MG tablet  ? ? ?Please advise. ? ?

## 2018-10-11 NOTE — Telephone Encounter (Signed)
1 month supply sent. She is due for diabetes f/u and A1c

## 2018-10-11 NOTE — Telephone Encounter (Signed)
Left patient a message to call back to schedule a follow up appointment.

## 2018-10-27 ENCOUNTER — Encounter: Payer: Self-pay | Admitting: Family Medicine

## 2018-10-27 ENCOUNTER — Telehealth (INDEPENDENT_AMBULATORY_CARE_PROVIDER_SITE_OTHER): Payer: BC Managed Care – PPO | Admitting: Family Medicine

## 2018-10-27 DIAGNOSIS — J029 Acute pharyngitis, unspecified: Secondary | ICD-10-CM

## 2018-10-27 DIAGNOSIS — R05 Cough: Secondary | ICD-10-CM | POA: Diagnosis not present

## 2018-10-27 DIAGNOSIS — R0981 Nasal congestion: Secondary | ICD-10-CM | POA: Diagnosis not present

## 2018-10-27 DIAGNOSIS — R059 Cough, unspecified: Secondary | ICD-10-CM

## 2018-10-27 NOTE — Progress Notes (Signed)
Patient: Bonnie Garcia Female    DOB: June 18, 1965   53 y.o.   MRN: [id number] Visit Date: 10/27/2018  Today's Provider: Lavon Paganini, MD   Chief Complaint  Patient presents with  . URI   Subjective:    Virtual Visit via Video Note  I connected with Bonnie Garcia on 10/27/18 at  2:40 PM EDT by a video enabled telemedicine application and verified that I am speaking with the correct person using two identifiers.   Patient location: home Provider location: [place] involved in the visit: patient, provider   I discussed the limitations of evaluation and management by telemedicine and the availability of in person appointments. The patient expressed understanding and agreed to proceed.  Interactive audio and video communications were attempted, although failed due to patient's inability to connect to video. Continued visit with audio only interaction with patient agreement.   URI  The current episode started 1 to 4 weeks ago. The problem has been unchanged. There has been no fever. Associated symptoms include congestion, coughing, sinus pain and a sore throat. Pertinent negatives include no nausea, sneezing or wheezing. She has tried acetaminophen, decongestant, NSAIDs and antihistamine for the symptoms. The treatment provided mild relief.   Symptoms present for ~ 1 wk.  Sore throat has resolved.  Still having drainage and cough.  Still feels congested.  No fever during this whole period  Tends to get seasonal allergy symptoms.  Reports that sore throat was the worst in a long time.  Was supposed to be seen for DM f/u, but was screened out due to symptoms.,    Allergies  Allergen Reactions  . Erythromycin Other (See Comments)    Reaction:  GI upset   . Penicillins Hives and Other (See Comments)    Has patient had a PCN reaction causing immediate rash, facial/tongue/throat swelling, SOB or lightheadedness with hypotension: No Has  patient had a PCN reaction causing severe rash involving mucus membranes or skin necrosis: No Has patient had a PCN reaction that required hospitalization No Has patient had a PCN reaction occurring within the last 10 years: No If all of the above answers are "NO", then may proceed with Cephalosporin use.     Current Outpatient Medications:  .  ALPRAZolam (XANAX) 0.5 MG tablet, Take 1 tablet by mouth 2 (two) times daily as needed for anxiety., Disp: , Rfl: 3 .  aspirin EC 81 MG tablet, Take 81 mg by mouth daily., Disp: , Rfl:  .  citalopram (CELEXA) 40 MG tablet, Take 1 tablet (40 mg total) by mouth daily., Disp: 90 tablet, Rfl: 3 .  Dapagliflozin-metFORMIN HCl ER (XIGDUO XR) 07-998 MG TB24, Take 1 tablet by mouth 2 (two) times daily., Disp: 180 tablet, Rfl: 5 .  glipiZIDE (GLUCOTROL) 5 MG tablet, Take 1 tablet (5 mg total) by mouth daily. Taken before largest meal of the day, Disp: 30 tablet, Rfl: 0 .  glucose blood test strip, Use as instructed to test blood sugar 2-3 times daily, Disp: 100 each, Rfl: 12 .  hydrOXYzine (ATARAX/VISTARIL) 25 MG tablet, Take 1 tablet (25 mg total) by mouth 3 (three) times daily as needed for anxiety., Disp: 90 tablet, Rfl: 1 .  LORazepam (ATIVAN) 0.5 MG tablet, TK 1 T PO  BID, Disp: , Rfl: 1 .  ondansetron (ZOFRAN) 4 MG tablet, Take 1 tablet (4 mg total) by mouth every 8 (eight) hours as needed for nausea or vomiting., Disp: 20 tablet,  Rfl: 3 .  rosuvastatin (CRESTOR) 5 MG tablet, Take 1 tablet (5 mg total) by mouth daily., Disp: 90 tablet, Rfl: 1 .  spironolactone (ALDACTONE) 50 MG tablet, Take 1 tablet (50 mg total) by mouth daily., Disp: 90 tablet, Rfl: 3 .  SPRAVATO, 56 MG DOSE, 28 MG/DEVICE SOPK, 1 Dose 2 (two) times a week., Disp: , Rfl:  .  TRULICITY 1.5 0000000 SOPN, INJECT 0.5 ML( 1.5 MG) UNDER THE SKIN ONCE A WEEK, Disp: 2 mL, Rfl: 5 .  Vitamin D, Ergocalciferol, (DRISDOL) 1.25 MG (50000 UT) CAPS capsule, TAKE 1 CAPSULE BY MOUTH EVERY FRIDAY, Disp: 12  capsule, Rfl: 1 .  zolpidem (AMBIEN) 10 MG tablet, Take 1 tablet by mouth at bedtime., Disp: , Rfl: 3  Review of Systems  HENT: Positive for congestion, sinus pain and sore throat. Negative for sneezing.   Respiratory: Positive for cough. Negative for wheezing.   Gastrointestinal: Negative for nausea.    Social History   Tobacco Use  . Smoking status: Former Smoker    Packs/day: 1.00    Years: 15.00    Pack years: 15.00    Types: Cigarettes    Quit date: 2009    Years since quitting: 11.6  . Smokeless tobacco: Never Used  Substance Use Topics  . Alcohol use: Yes    Comment: socially, not even monthly      Objective:   There were no vitals taken for this visit. There were no vitals filed for this visit.   Physical Exam Speaks in full sentences with no apparent SOB  No results found for any visits on 10/27/18.     Assessment & Plan      I discussed the assessment and treatment plan with the patient. The patient was provided an opportunity to ask questions and all were answered. The patient agreed with the plan and demonstrated an understanding of the instructions.   The patient was advised to call back or seek an in-person evaluation if the symptoms worsen or if the condition fails to improve as anticipated.  1. Cough 2. Sore throat 3. Nasal congestion -new problem x1 wk - no known exposures, but discussed that symptoms could be c/w COVID19 infection - other things of differential include allergic rhinitis with post-nasal drip, other viral URI - low to no concern of strep pharyngitis, CAP, AOM, bacterial sinusitis, or other bacterial infection - discussed symptomatic management, natural course, and return precautions  - will send for OP COVID testing - discussed self-quarantine - Novel Coronavirus, NAA ([facility])    Return in about 1 month (around 11/27/2018) for chronic disease f/u.   The entirety of the information documented in the History of Present  Illness, Review of Systems and Physical Exam were personally obtained by me. Portions of this information were initially documented by Tiburcio Pea, CMA and reviewed by me for thoroughness and accuracy.    Eddrick Dilone, Dionne Bucy, MD MPH [facility]

## 2018-10-28 ENCOUNTER — Other Ambulatory Visit: Payer: Self-pay | Admitting: *Deleted

## 2018-10-28 DIAGNOSIS — Z20822 Contact with and (suspected) exposure to covid-19: Secondary | ICD-10-CM

## 2018-10-29 LAB — NOVEL CORONAVIRUS, NAA: SARS-CoV-2, NAA: NOT DETECTED

## 2018-11-01 ENCOUNTER — Telehealth: Payer: Self-pay

## 2018-11-01 NOTE — Telephone Encounter (Signed)
Patient advised.

## 2018-11-01 NOTE — Telephone Encounter (Signed)
LMTCB

## 2018-11-01 NOTE — Telephone Encounter (Signed)
-----   Message from Virginia Crews, MD sent at 11/01/2018  8:32 AM EDT ----- Negative COVID test

## 2018-11-10 ENCOUNTER — Other Ambulatory Visit: Payer: Self-pay | Admitting: Family Medicine

## 2018-11-10 MED ORDER — GLIPIZIDE 5 MG PO TABS: 5.0000 mg | ORAL_TABLET | Freq: Every day | ORAL | 0 refills | Status: DC

## 2018-11-10 NOTE — Telephone Encounter (Signed)
L.O.V. was 06/23/2018. 

## 2018-11-10 NOTE — Telephone Encounter (Signed)
[facility] faxed refill request for the following medications: ? ?glipiZIDE (GLUCOTROL) 5 MG tablet  ? ? ?Please advise. ? ?

## 2018-11-30 ENCOUNTER — Other Ambulatory Visit: Payer: Self-pay | Admitting: Family Medicine

## 2018-11-30 MED ORDER — VITAMIN D (ERGOCALCIFEROL) 1.25 MG (50000 UNIT) PO CAPS: ORAL_CAPSULE | ORAL | 1 refills | Status: DC

## 2018-11-30 NOTE — Telephone Encounter (Signed)
[facility] faxed refill request for the following medications:  Vitamin D, Ergocalciferol, (DRISDOL) 1.25 MG (50000 UT) CAPS capsule   Please advise.

## 2018-12-02 ENCOUNTER — Other Ambulatory Visit: Payer: Self-pay

## 2018-12-02 ENCOUNTER — Ambulatory Visit (INDEPENDENT_AMBULATORY_CARE_PROVIDER_SITE_OTHER): Payer: BC Managed Care – PPO | Admitting: Family Medicine

## 2018-12-02 ENCOUNTER — Encounter: Payer: Self-pay | Admitting: Family Medicine

## 2018-12-02 VITALS — BP 138/85 | HR 100 | Temp 96.2°F | Wt 301.6 lb

## 2018-12-02 DIAGNOSIS — Z23 Encounter for immunization: Secondary | ICD-10-CM

## 2018-12-02 DIAGNOSIS — E785 Hyperlipidemia, unspecified: Secondary | ICD-10-CM

## 2018-12-02 DIAGNOSIS — N926 Irregular menstruation, unspecified: Secondary | ICD-10-CM

## 2018-12-02 DIAGNOSIS — E041 Nontoxic single thyroid nodule: Secondary | ICD-10-CM

## 2018-12-02 DIAGNOSIS — E559 Vitamin D deficiency, unspecified: Secondary | ICD-10-CM

## 2018-12-02 DIAGNOSIS — E1169 Type 2 diabetes mellitus with other specified complication: Secondary | ICD-10-CM | POA: Diagnosis not present

## 2018-12-02 DIAGNOSIS — E538 Deficiency of other specified B group vitamins: Secondary | ICD-10-CM

## 2018-12-02 DIAGNOSIS — H66004 Acute suppurative otitis media without spontaneous rupture of ear drum, recurrent, right ear: Secondary | ICD-10-CM

## 2018-12-02 DIAGNOSIS — I10 Essential (primary) hypertension: Secondary | ICD-10-CM

## 2018-12-02 LAB — POCT GLYCOSYLATED HEMOGLOBIN (HGB A1C): Hemoglobin A1C: 7.8 % — AB (ref 4.0–5.6)

## 2018-12-02 MED ORDER — XIGDUO XR 5-1000 MG PO TB24: 1.0000 | ORAL_TABLET | Freq: Two times a day (BID) | ORAL | 5 refills | Status: DC

## 2018-12-02 MED ORDER — ROSUVASTATIN CALCIUM 5 MG PO TABS: 5.0000 mg | ORAL_TABLET | Freq: Every day | ORAL | 1 refills | Status: DC

## 2018-12-02 MED ORDER — ONDANSETRON HCL 4 MG PO TABS: 4.0000 mg | ORAL_TABLET | Freq: Three times a day (TID) | ORAL | 3 refills | Status: DC | PRN

## 2018-12-02 MED ORDER — GLIPIZIDE 5 MG PO TABS: 5.0000 mg | ORAL_TABLET | Freq: Every day | ORAL | 1 refills | Status: DC

## 2018-12-02 MED ORDER — OZEMPIC (0.25 OR 0.5 MG/DOSE) 2 MG/1.5ML ~~LOC~~ SOPN: 0.5000 mg | PEN_INJECTOR | SUBCUTANEOUS | 4 refills | Status: DC

## 2018-12-02 MED ORDER — CIPROFLOXACIN-DEXAMETHASONE 0.3-0.1 % OT SUSP: 4.0000 [drp] | Freq: Two times a day (BID) | OTIC | 0 refills | Status: DC

## 2018-12-02 MED ORDER — DOXYCYCLINE HYCLATE 100 MG PO TABS: 100.0000 mg | ORAL_TABLET | Freq: Two times a day (BID) | ORAL | 0 refills | Status: AC

## 2018-12-02 MED ORDER — SPIRONOLACTONE 50 MG PO TABS: 50.0000 mg | ORAL_TABLET | Freq: Every day | ORAL | 3 refills | Status: DC

## 2018-12-02 MED ORDER — BD PEN NEEDLE MINI U/F 31G X 5 MM MISC: 1 refills | Status: DC

## 2018-12-02 NOTE — Progress Notes (Signed)
Patient: Bonnie Garcia Female    DOB: Jun 04, 1965   53 y.o.   MRN: [id number] Visit Date: 12/02/2018  Today's Provider: Lavon Paganini, MD   Chief Complaint  Patient presents with  . Diabetes  . Hypertension   Subjective:    I, Tiburcio Pea, CMA, am acting as a Education administrator for Lavon Paganini, MD.    HPI  Diabetes Mellitus Type II, Follow-up:   Recent Labs       Lab Results  Component Value Date   HGBA1C 7.9 (A) 06/23/2018   HGBA1C 7.9 (A) 03/23/2018   HGBA1C 7.8 (A) 12/17/2017     Last seen for diabetes 6 months ago.  Management since then includes added Glipizide 5 mg She reports good compliance with treatment. She is not having side effects.  Current symptoms include none  Home blood sugar records: fasting range: 160's  Episodes of hypoglycemia? no              Current Insulin Regimen:Trulicity  Most Recent Eye Exam: 11/27/2017 Weight trend: stable Prior visit with dietician: no Current diet: in general, an "unhealthy" diet Current exercise: none  ------------------------------------------------------------------------   Hypertension, follow-up:     BP Readings from Last 3 Encounters:  06/23/18 122/80  03/23/18 131/84  12/17/17 138/84    She was last seen for hypertension 6 months ago.  BP at that visit was 122/80 Management since that visit includes no changes. She reports good compliance with treatment. She is not having side effects.  She is not exercising. She is not adherent to low salt diet.   Outside blood pressures are not being checked. She is experiencing none.  Patient denies chest pain, chest pressure/discomfort, claudication, dyspnea, exertional chest pressure/discomfort, fatigue, irregular heart beat, lower extremity edema, near-syncope, orthopnea, palpitations, paroxysmal nocturnal dyspnea, syncope and tachypnea.   Cardiovascular risk factors include diabetes mellitus, hypertension and obesity (BMI >= 30  kg/m2).  Use of agents associated with hypertension: none.   ------------------------------------------------------------------------  R otalgia x2 days.  No fever or other symptoms.  Has trouble with ear infections multiple times. Has had to have wick placed before for swelling.    Allergies  Allergen Reactions  . Erythromycin Other (See Comments)    Reaction:  GI upset   . Penicillins Hives and Other (See Comments)    Has patient had a PCN reaction causing immediate rash, facial/tongue/throat swelling, SOB or lightheadedness with hypotension: No Has patient had a PCN reaction causing severe rash involving mucus membranes or skin necrosis: No Has patient had a PCN reaction that required hospitalization No Has patient had a PCN reaction occurring within the last 10 years: No If all of the above answers are "NO", then may proceed with Cephalosporin use.     Current Outpatient Medications:  .  ADDERALL XR 25 MG 24 hr capsule, , Disp: , Rfl:  .  ALPRAZolam (XANAX) 0.5 MG tablet, Take 1 tablet by mouth 2 (two) times daily as needed for anxiety., Disp: , Rfl: 3 .  aspirin EC 81 MG tablet, Take 81 mg by mouth daily., Disp: , Rfl:  .  citalopram (CELEXA) 40 MG tablet, Take 1 tablet (40 mg total) by mouth daily., Disp: 90 tablet, Rfl: 3 .  cloNIDine (CATAPRES) 0.1 MG tablet, , Disp: , Rfl:  .  Dapagliflozin-metFORMIN HCl ER (XIGDUO XR) 07-998 MG TB24, Take 1 tablet by mouth 2 (two) times daily., Disp: 180 tablet, Rfl: 5 .  gabapentin (NEURONTIN) 300 MG capsule, ,  Disp: , Rfl:  .  glipiZIDE (GLUCOTROL) 5 MG tablet, Take 1 tablet (5 mg total) by mouth daily. Taken before largest meal of the day, Disp: 30 tablet, Rfl: 0 .  glucose blood test strip, Use as instructed to test blood sugar 2-3 times daily, Disp: 100 each, Rfl: 12 .  hydrOXYzine (ATARAX/VISTARIL) 25 MG tablet, Take 1 tablet (25 mg total) by mouth 3 (three) times daily as needed for anxiety., Disp: 90 tablet, Rfl: 1 .  LORazepam  (ATIVAN) 0.5 MG tablet, TK 1 T PO  BID, Disp: , Rfl: 1 .  LORazepam (ATIVAN) 1 MG tablet, TK 1 T PO BID UTD FOR PANIC SYMPTOMS, Disp: , Rfl:  .  ondansetron (ZOFRAN) 4 MG tablet, Take 1 tablet (4 mg total) by mouth every 8 (eight) hours as needed for nausea or vomiting., Disp: 20 tablet, Rfl: 3 .  ondansetron (ZOFRAN-ODT) 4 MG disintegrating tablet, , Disp: , Rfl:  .  rosuvastatin (CRESTOR) 5 MG tablet, Take 1 tablet (5 mg total) by mouth daily., Disp: 90 tablet, Rfl: 1 .  spironolactone (ALDACTONE) 50 MG tablet, Take 1 tablet (50 mg total) by mouth daily., Disp: 90 tablet, Rfl: 3 .  SPRAVATO, 56 MG DOSE, 28 MG/DEVICE SOPK, 1 Dose 2 (two) times a week., Disp: , Rfl:  .  traZODone (DESYREL) 150 MG tablet, , Disp: , Rfl:  .  TRULICITY 1.5 0000000 SOPN, INJECT 0.5 ML( 1.5 MG) UNDER THE SKIN ONCE A WEEK, Disp: 2 mL, Rfl: 5 .  Vitamin D, Ergocalciferol, (DRISDOL) 1.25 MG (50000 UT) CAPS capsule, TAKE 1 CAPSULE BY MOUTH EVERY FRIDAY, Disp: 12 capsule, Rfl: 1  Review of Systems  Constitutional: Negative.   Respiratory: Negative.   Cardiovascular: Negative.   Musculoskeletal: Negative.     Social History   Tobacco Use  . Smoking status: Former Smoker    Packs/day: 1.00    Years: 15.00    Pack years: 15.00    Types: Cigarettes    Quit date: 2009    Years since quitting: 11.7  . Smokeless tobacco: Never Used  Substance Use Topics  . Alcohol use: Yes    Comment: socially, not even monthly      Objective:   BP 138/85 (BP Location: Right Arm, Patient Position: Sitting, Cuff Size: Large)   Pulse 100   Temp (!) 96.2 F (35.7 C) (Temporal)   Wt (!) 301 lb 9.6 oz (136.8 kg)   SpO2 97%   BMI 45.86 kg/m  Vitals:   12/02/18 1325  BP: 138/85  Pulse: 100  Temp: (!) 96.2 F (35.7 C)  TempSrc: Temporal  SpO2: 97%  Weight: (!) 301 lb 9.6 oz (136.8 kg)  Body mass index is 45.86 kg/m.   Physical Exam Vitals signs reviewed.  Constitutional:      General: She is not in acute distress.     Appearance: Normal appearance. She is well-developed. She is not diaphoretic.  HENT:     Head: Normocephalic and atraumatic.     Right Ear: External ear normal. Swelling present. Tympanic membrane is injected, erythematous and bulging.     Left Ear: Tympanic membrane, ear canal and external ear normal.  Eyes:     General: No scleral icterus.    Conjunctiva/sclera: Conjunctivae normal.  Neck:     Musculoskeletal: Neck supple.     Thyroid: No thyromegaly.  Cardiovascular:     Rate and Rhythm: Normal rate and regular rhythm.     Pulses: Normal pulses.  Heart sounds: Normal heart sounds. No murmur.  Pulmonary:     Effort: Pulmonary effort is normal. No respiratory distress.     Breath sounds: Normal breath sounds. No wheezing, rhonchi or rales.  Musculoskeletal:     Right lower leg: No edema.     Left lower leg: No edema.  Lymphadenopathy:     Cervical: No cervical adenopathy.  Skin:    General: Skin is warm and dry.     Capillary Refill: Capillary refill takes less than 2 seconds.     Findings: No rash.  Neurological:     Mental Status: She is alert and oriented to person, place, and time. Mental status is at baseline.  Psychiatric:        Mood and Affect: Mood normal.        Behavior: Behavior normal.      No results found for any visits on 12/02/18.     Assessment & Plan   Problem List Items Addressed This Visit      Cardiovascular and Mediastinum   Hypertension    Well controlled Continue current medications Recheck metabolic panel F/u in 6 months       Relevant Medications   cloNIDine (CATAPRES) 0.1 MG tablet   spironolactone (ALDACTONE) 50 MG tablet   rosuvastatin (CRESTOR) 5 MG tablet   Other Relevant Orders   Comprehensive metabolic panel     Endocrine   Diabetes mellitus without complication (HCC) - Primary    Chronic and uncontrolled A1c stable at 7.8, discussed goal A1c <7 Needs microalbumin at next visit Continue current meds, except  switch Trulicity to Ozempic 0.5mg  weekly Needs foot exam and urine microalbumin at next visit Needs to schedule follow-up eye exam Follow-up in 3 months      Relevant Medications   Semaglutide,0.25 or 0.5MG /DOS, (OZEMPIC, 0.25 OR 0.5 MG/DOSE,) 2 MG/1.5ML SOPN   rosuvastatin (CRESTOR) 5 MG tablet   glipiZIDE (GLUCOTROL) 5 MG tablet   Dapagliflozin-metFORMIN HCl ER (XIGDUO XR) 07-998 MG TB24   Thyroid nodule    Previously followed by endocrinology Benign biopsy in 2017 Follow-up ultrasound in 2018 showed stability of nodules Recheck TSH      Relevant Orders   TSH   Hyperlipidemia associated with type 2 diabetes mellitus (HCC)    Continue Crestor Repeat lipid panel and CMP      Relevant Medications   Semaglutide,0.25 or 0.5MG /DOS, (OZEMPIC, 0.25 OR 0.5 MG/DOSE,) 2 MG/1.5ML SOPN   rosuvastatin (CRESTOR) 5 MG tablet   glipiZIDE (GLUCOTROL) 5 MG tablet   Dapagliflozin-metFORMIN HCl ER (XIGDUO XR) 07-998 MG TB24   Other Relevant Orders   Comprehensive metabolic panel   Lipid panel     Other   B12 deficiency    Recheck B12 level      Relevant Orders   B12   Morbid obesity ([place])    Discussed importance of healthy weight management Discussed diet and exercise Trial of switching Trulicity to Ozempic Could consider Contrave in the future She will need to check with her psychiatrist to ensure that Wellbutrin is okay with her other medications if we are going to try Contrave      Relevant Medications   ADDERALL XR 25 MG 24 hr capsule   Semaglutide,0.25 or 0.5MG /DOS, (OZEMPIC, 0.25 OR 0.5 MG/DOSE,) 2 MG/1.5ML SOPN   glipiZIDE (GLUCOTROL) 5 MG tablet   Dapagliflozin-metFORMIN HCl ER (XIGDUO XR) 07-998 MG TB24    Other Visit Diagnoses    Avitaminosis D       Relevant  Orders   VITAMIN D 25 Hydroxy (Vit-D Deficiency, Fractures)   Recurrent acute suppurative otitis media of right ear without spontaneous rupture of tympanic membrane       Relevant Medications   doxycycline  (VIBRA-TABS) 100 MG tablet   Irregular menses       Relevant Orders   FSH/LH   CBC w/Diff/Platelet   Need for influenza vaccination       Relevant Orders   Flu Vaccine QUAD 6+ mos PF IM (Fluarix Quad PF) (Completed)       Return in about 3 months (around 03/04/2019) for CPE.   The entirety of the information documented in the History of Present Illness, Review of Systems and Physical Exam were personally obtained by me. Portions of this information were initially documented by Tiburcio Pea, CMA and reviewed by me for thoroughness and accuracy.    Bacigalupo, Dionne Bucy, MD MPH [facility]

## 2018-12-02 NOTE — Assessment & Plan Note (Signed)
Recheck B12 level 

## 2018-12-02 NOTE — Assessment & Plan Note (Signed)
Continue Crestor Repeat lipid panel and CMP

## 2018-12-02 NOTE — Assessment & Plan Note (Signed)
Well controlled Continue current medications Recheck metabolic panel F/u in 6 months  

## 2018-12-02 NOTE — Assessment & Plan Note (Signed)
Previously followed by endocrinology Benign biopsy in 2017 Follow-up ultrasound in 2018 showed stability of nodules Recheck TSH

## 2018-12-02 NOTE — Assessment & Plan Note (Signed)
Chronic and uncontrolled A1c stable at 7.8, discussed goal A1c <7 Needs microalbumin at next visit Continue current meds, except switch Trulicity to Ozempic 0.5mg  weekly Needs foot exam and urine microalbumin at next visit Needs to schedule follow-up eye exam Follow-up in 3 months

## 2018-12-02 NOTE — Assessment & Plan Note (Signed)
Discussed importance of healthy weight management Discussed diet and exercise Trial of switching Trulicity to Ozempic Could consider Contrave in the future She will need to check with her psychiatrist to ensure that Wellbutrin is okay with her other medications if we are going to try Contrave

## 2018-12-03 ENCOUNTER — Telehealth: Payer: Self-pay

## 2018-12-03 LAB — CBC WITH DIFFERENTIAL/PLATELET
Basophils Absolute: 0.1 10*3/uL (ref 0.0–0.2)
Basos: 1 %
EOS (ABSOLUTE): 0.2 10*3/uL (ref 0.0–0.4)
Eos: 2 %
Hematocrit: 47.2 % — ABNORMAL HIGH (ref 34.0–46.6)
Hemoglobin: 16.2 g/dL — ABNORMAL HIGH (ref 11.1–15.9)
Immature Grans (Abs): 0 10*3/uL (ref 0.0–0.1)
Immature Granulocytes: 0 %
Lymphocytes Absolute: 2.9 10*3/uL (ref 0.7–3.1)
Lymphs: 30 %
MCH: 31.3 pg (ref 26.6–33.0)
MCHC: 34.3 g/dL (ref 31.5–35.7)
MCV: 91 fL (ref 79–97)
Monocytes Absolute: 0.6 10*3/uL (ref 0.1–0.9)
Monocytes: 6 %
Neutrophils Absolute: 5.8 10*3/uL (ref 1.4–7.0)
Neutrophils: 61 %
Platelets: 384 10*3/uL (ref 150–450)
RBC: 5.17 x10E6/uL (ref 3.77–5.28)
RDW: 12.6 % (ref 11.7–15.4)
WBC: 9.6 10*3/uL (ref 3.4–10.8)

## 2018-12-03 LAB — COMPREHENSIVE METABOLIC PANEL
ALT: 37 IU/L — ABNORMAL HIGH (ref 0–32)
AST: 38 IU/L (ref 0–40)
Albumin/Globulin Ratio: 1.9 (ref 1.2–2.2)
Albumin: 4.5 g/dL (ref 3.8–4.9)
Alkaline Phosphatase: 57 IU/L (ref 39–117)
BUN/Creatinine Ratio: 11 (ref 9–23)
BUN: 11 mg/dL (ref 6–24)
Bilirubin Total: 0.2 mg/dL (ref 0.0–1.2)
CO2: 19 mmol/L — ABNORMAL LOW (ref 20–29)
Calcium: 9.7 mg/dL (ref 8.7–10.2)
Chloride: 97 mmol/L (ref 96–106)
Creatinine, Ser: 0.98 mg/dL (ref 0.57–1.00)
GFR calc Af Amer: 77 mL/min/{1.73_m2} (ref >59)
GFR calc non Af Amer: 67 mL/min/{1.73_m2} (ref >59)
Globulin, Total: 2.4 g/dL (ref 1.5–4.5)
Glucose: 210 mg/dL — ABNORMAL HIGH (ref 65–99)
Potassium: 4.4 mmol/L (ref 3.5–5.2)
Sodium: 136 mmol/L (ref 134–144)
Total Protein: 6.9 g/dL (ref 6.0–8.5)

## 2018-12-03 LAB — LIPID PANEL
Chol/HDL Ratio: 4.1 ratio (ref 0.0–4.4)
Cholesterol, Total: 178 mg/dL (ref 100–199)
HDL: 43 mg/dL (ref >39)
LDL Chol Calc (NIH): 94 mg/dL (ref 0–99)
Triglycerides: 246 mg/dL — ABNORMAL HIGH (ref 0–149)
VLDL Cholesterol Cal: 41 mg/dL — ABNORMAL HIGH (ref 5–40)

## 2018-12-03 LAB — FSH/LH
FSH: 7.3 m[IU]/mL
LH: 3.7 m[IU]/mL

## 2018-12-03 LAB — VITAMIN D 25 HYDROXY (VIT D DEFICIENCY, FRACTURES): Vit D, 25-Hydroxy: 46.5 ng/mL (ref 30.0–100.0)

## 2018-12-03 LAB — TSH: TSH: 1.11 u[IU]/mL (ref 0.450–4.500)

## 2018-12-03 LAB — VITAMIN B12: Vitamin B-12: 322 pg/mL (ref 232–1245)

## 2018-12-03 NOTE — Telephone Encounter (Signed)
Patient advised. She has not been taking Crestor regularly. She states she will start back.

## 2018-12-03 NOTE — Telephone Encounter (Signed)
-----   Message from Virginia Crews, MD sent at 12/03/2018  1:16 PM EDT ----- Normal thyroid function, vitamin D, vitamin B12, kidney function, liver function, electrolytes.  Blood counts are stable.  Hormonal labs do not show that you are postmenopausal.  A1c is not yet to goal.  Interested to see if changing to Ozempic will help this.  Will repeat in 3 months.  Cholesterol is not to goal.  Taking Crestor regularly?

## 2018-12-08 ENCOUNTER — Other Ambulatory Visit: Payer: Self-pay | Admitting: Family Medicine

## 2018-12-08 NOTE — Telephone Encounter (Signed)
Medication was already filled on 12/02/2018.

## 2018-12-08 NOTE — Telephone Encounter (Signed)
[facility] faxed refill request for the following medications: ? ?glipiZIDE (GLUCOTROL) 5 MG tablet  ? ? ?Please advise. ? ?

## 2018-12-23 ENCOUNTER — Telehealth: Payer: Self-pay | Admitting: Family Medicine

## 2018-12-23 NOTE — Telephone Encounter (Signed)
Pt saw spravato treatment doctor.  The doctor said the Contrave would be fine.   Please call into:  [facility] [place], [address] AT [place] [phone] (Phone) [phone] (Fax)   Thanks, [employer]

## 2018-12-27 MED ORDER — NALTREXONE-BUPROPION HCL ER 8-90 MG PO TB12: ORAL_TABLET | ORAL | 3 refills | Status: DC

## 2018-12-27 NOTE — Telephone Encounter (Signed)
Rx sent for contrave

## 2019-02-17 ENCOUNTER — Other Ambulatory Visit: Payer: Self-pay

## 2019-02-17 ENCOUNTER — Ambulatory Visit (INDEPENDENT_AMBULATORY_CARE_PROVIDER_SITE_OTHER): Payer: BC Managed Care – PPO

## 2019-02-17 ENCOUNTER — Ambulatory Visit
Admission: EM | Admit: 2019-02-17 | Discharge: 2019-02-17 | Disposition: A | Discharge status: Home / Self Care | Payer: BC Managed Care – PPO | Attending: Urgent Care | Admitting: Urgent Care

## 2019-02-17 DIAGNOSIS — R0602 Shortness of breath: Secondary | ICD-10-CM

## 2019-02-17 DIAGNOSIS — R05 Cough: Secondary | ICD-10-CM

## 2019-02-17 DIAGNOSIS — Z20828 Contact with and (suspected) exposure to other viral communicable diseases: Secondary | ICD-10-CM | POA: Insufficient documentation

## 2019-02-17 DIAGNOSIS — Z20822 Contact with and (suspected) exposure to covid-19: Secondary | ICD-10-CM

## 2019-02-17 DIAGNOSIS — J069 Acute upper respiratory infection, unspecified: Secondary | ICD-10-CM | POA: Insufficient documentation

## 2019-02-17 DIAGNOSIS — F419 Anxiety disorder, unspecified: Secondary | ICD-10-CM | POA: Insufficient documentation

## 2019-02-17 LAB — SARS CORONAVIRUS 2 AG (30 MIN TAT): SARS Coronavirus 2 Ag: NEGATIVE

## 2019-02-17 NOTE — Discharge Instructions (Addendum)
It was very nice seeing you today in clinic. Thank you for entrusting me with your care.   Rest and increase fluid intake. Rapid COVID testing was negative. Will also send confirmatory testing to [facility] cheat xray was normal. May use Tylenol and/or Ibuprofen as needed for pain/fever.   Make arrangements to follow up with your regular doctor in the next few days for re-evaluation if not improving. If your symptoms/condition worsens, please seek follow up care either here or in the ER. Please remember, our [place] providers are "right here with you" when you need [place].   Again, it was my pleasure to take care of you today. Thank you for choosing our clinic. I hope that you start to feel better quickly.   Honor Loh, MSN, APRN, FNP-C, CEN Advanced Practice Provider [facility]

## 2019-02-17 NOTE — ED Triage Notes (Addendum)
Pt. States she becomes out of breath just walking in her house. States she has a hx of anxiety/panic attacks. States she is on meds for it but her heart has been racing then settles then races again for about the last 2 weeks. States it feels like someone is sitting on my chest.

## 2019-02-18 LAB — NOVEL CORONAVIRUS, NAA (HOSP ORDER, SEND-OUT TO REF LAB; TAT 18-24 HRS): SARS-CoV-2, NAA: NOT DETECTED

## 2019-02-18 NOTE — ED Provider Notes (Signed)
[place], [place]   Name: Bonnie Garcia DOB: 1966-02-16 MRN: [id number] CSN: [id number] PCP: Virginia Crews, MD  Arrival date and time:  02/17/19 1539  Chief Complaint:  Shortness of Breath and Nausea   NOTE: Prior to seeing the patient today, I have reviewed the triage nursing documentation and vital signs. Clinical staff has updated patient's PMH/PSHx, current medication list, and drug allergies/intolerances to ensure comprehensive history available to assist in medical decision making.   History:   HPI: Bonnie Garcia is a 53 y.o. female who presents today with complaints of cough, rhinorrhea, SOB, generalized weakness, palpitations, and chest pressure that started approximately 2 weeks ago. She notes that her heart has felt "fluttery and racing" at time; denies at time of clinic visit. PMH (+) anxiety and thyroid dysfunction. Patient has been running an elevated temperature, and reports that her Tmax has been 99.2. Cough has been non-productive. She denies that she has experienced any vomiting, diarrhea, or abdominal pain. She has had intermittent episodes of nausea. She is eating and drinking well. Patient denies any perceived alterations to her sense of taste or smell. Patient denies being in close contact with anyone known to be ill; no one else is her home has experienced a similar symptom constellation. She has not been tested for SARS-CoV-2 (novel coronavirus) in the past 14 days; last tested negative in 11/2018 per her report. Patient has been vaccinated for influenza this season.Despite her symptoms, patient has not taken any over the counter interventions to help improve/relieve her reported symptoms at home.   Past Medical History:  Diagnosis Date  . ADHD (attention deficit hyperactivity disorder)   . Anxiety   . Depression   . Diabetes mellitus without complication ([place])   . Hypertension   . Kidney stones   . Thyroid nodule     Past Surgical History:    Procedure Laterality Date  . APPENDECTOMY    . CHOLECYSTECTOMY    . COLONOSCOPY WITH PROPOFOL N/A 07/23/2015   Procedure: COLONOSCOPY WITH PROPOFOL;  Surgeon: Lollie Sails, MD;  Location[facility] ENDOSCOPY;  Service: Endoscopy;  Laterality: N/A;  . ECTOPIC PREGNANCY SURGERY    . ENDOMETRIAL ABLATION    . ESOPHAGOGASTRODUODENOSCOPY (EGD) WITH PROPOFOL N/A 07/23/2015   Procedure: ESOPHAGOGASTRODUODENOSCOPY (EGD) WITH PROPOFOL;  Surgeon: Lollie Sails, MD;  Location: [facility] ENDOSCOPY;  Service: Endoscopy;  Laterality: N/A;  . TUBAL LIGATION      Family History  Problem Relation Age of Onset  . Breast cancer Paternal Aunt   . Pancreatic cancer Paternal Aunt   . Thyroid disease Paternal Aunt   . Breast cancer Paternal Grandmother   . Diabetes Paternal Grandmother   . Hypertension Paternal Grandmother   . Diabetes Mother   . Glaucoma Mother   . Diabetes Father   . Hypertension Father   . Heart disease Father   . Parkinson's disease Father   . Kidney disease Father   . Skin cancer Father   . Depression Father   . Stroke Father   . Seizures Father   . GER disease Father   . Sleep apnea Father   . Bell's palsy Father   . Anxiety disorder Sister   . Hypertension Sister   . Arthritis Sister   . Anxiety disorder Daughter   . Depression Daughter   . Depression Son   . Anxiety disorder Son   . Thyroid cancer Maternal Uncle   . Suicidality Maternal Uncle   . Cataracts Maternal Grandmother   .  Heart attack Maternal Grandfather 42  . Throat cancer Paternal Grandfather   . Colon cancer Neg Hx   . Ovarian cancer Neg Hx   . Cervical cancer Neg Hx     Social History   Tobacco Use  . Smoking status: Former Smoker    Packs/day: 1.00    Years: 15.00    Pack years: 15.00    Types: Cigarettes    Quit date: 2009    Years since quitting: 11.9  . Smokeless tobacco: Never Used  Substance Use Topics  . Alcohol use: Yes    Comment: socially, not even monthly  . Drug use: No     Patient Active Problem List   Diagnosis Date Noted  . Myalgia 12/17/2017  . Thyroid nodule 10/21/2017  . Hyperlipidemia associated with type 2 diabetes mellitus ([place]) 10/21/2017  . PCOS (polycystic ovarian syndrome) 10/21/2017  . Hirsutism 10/21/2017  . Dysuria 10/21/2017  . Severe recurrent major depression without psychotic features ([place]) 09/11/2017  . ADHD (attention deficit hyperactivity disorder) 09/11/2017  . Diabetes mellitus without complication ([place]) [id number]  . Hypertension 09/11/2017  . B12 deficiency 10/13/2013  . Morbid obesity ([place]) 10/13/2013  . Ureteric stone 06/08/2013  . Calculus of kidney 01/21/2013    Home Medications:    No outpatient medications have been marked as taking for the 02/17/19 encounter [facility] Encounter).    Allergies:   Erythromycin and Penicillins  Review of Systems (ROS): Review of Systems  Constitutional: Positive for fatigue and fever. Negative for chills.  HENT: Positive for nosebleeds and rhinorrhea.   Respiratory: Positive for shortness of breath. Negative for cough and wheezing.   Cardiovascular: Positive for chest pain (pressure) and palpitations.  Gastrointestinal: Positive for nausea. Negative for abdominal pain, diarrhea and vomiting.  Musculoskeletal: Negative for back pain and myalgias.  Skin: Negative for color change, pallor and rash.  Neurological: Positive for weakness (generalized). Negative for dizziness, syncope and headaches.  Psychiatric/Behavioral: The patient is nervous/anxious.   All other systems reviewed and are negative.    Vital Signs: Today's Vitals   02/17/19 1600 02/17/19 1601 02/17/19 1726 02/17/19 1730  BP:  (!) 152/99    Pulse:  (!) 102 95   Resp:  (!) 22 20   Temp:  99.1 F (37.3 C)    TempSrc:  Oral    SpO2:  94% 98%   Weight: 290 lb (131.5 kg)     PainSc:    0-No pain    Physical Exam: Physical Exam  Constitutional: She is oriented to person, place, and time and well-developed,  well-nourished, and in no distress.  HENT:  Head: Normocephalic and atraumatic.  Mouth/Throat: Mucous membranes are normal.  Eyes: Pupils are equal, round, and reactive to light. EOM are normal.  Neck: No tracheal deviation present.  Cardiovascular: Normal rate, regular rhythm, normal heart sounds and intact distal pulses. Exam reveals no gallop and no friction rub.  No murmur heard. Pulmonary/Chest: Effort normal and breath sounds normal. No respiratory distress. She has no wheezes. She has no rales.  Musculoskeletal:        General: Normal range of motion.     Cervical back: Normal range of motion and neck supple.  Neurological: She is alert and oriented to person, place, and time. Gait normal.  Skin: Skin is warm and dry. No rash noted.  Psychiatric: Memory, affect and judgment normal. Her mood appears anxious.  Nursing note and vitals reviewed.   Urgent Care Treatments / Results:  LABS: PLEASE NOTE:  all labs that were ordered this encounter are listed, however only abnormal results are displayed. Labs Reviewed  SARS CORONAVIRUS 2 AG (30 MIN TAT)  NOVEL CORONAVIRUS, NAA (HOSP ORDER, SEND-OUT TO REF LAB; TAT 18-24 HRS)    URGENT CARE ECG REPORT Date: 02/17/2019 Time ECG obtained: 1612 PM Rate: 98 bpm Rhythm: normal sinus rhythm Axis (leads I and aVF): normal Intervals: normal ST segment and T wave changes: No evidence of ST segment elevation or depression Comparison: Similar to previous tracing obtained on 09/12/2017    RADIOLOGY: DG Chest 2 View  Result Date: 02/17/2019 CLINICAL DATA:  Chest pain, shortness of breath. EXAM: CHEST - 2 VIEW COMPARISON:  August 03, 2015. FINDINGS: The heart size and mediastinal contours are within normal limits. Both lungs are clear. No pneumothorax or pleural effusion is noted. The visualized skeletal structures are unremarkable. IMPRESSION: No active cardiopulmonary disease. Electronically Signed   By: Marijo Conception M.D.   On: 02/17/2019  16:54    PROCEDURES: Procedures  MEDICATIONS RECEIVED THIS VISIT: Medications - No data to display  PERTINENT CLINICAL COURSE NOTES/UPDATES:   Initial Impression / Assessment and Plan / Urgent Care Course:  Pertinent labs & imaging results that were available during my care of the patient were personally reviewed by me and considered in my medical decision making (see lab/imaging section of note for values and interpretations).  Bonnie Garcia is a 52 y.o. female who presents to [facility] today with complaints of Shortness of Breath and Nausea   Patient overall well appearing and in no acute distress today in clinic. Presenting symptoms (see HPI) and exam as documented above. She presents with symptoms associated with SARS-CoV-2 (novel coronavirus). Discussed typical symptom constellation. Reviewed potential for infection and need for testing. Patient amenable to being tested. SARS-CoV-2 swab collected by certified clinical staff. Discussed variable turn around times associated with testing, as swabs are being processed at [facility], and have been taking between 2-5 days to come back. She was advised to self quarantine, per [facility] DHHS guidelines, until negative results received. These measures are being implemented out of an abundance of caution to prevent transmission and spread during the current SARS-CoV-2 pandemic.  Workup of symptoms unremarkable. EKG showed NSR without ectopy at a rate of 98 bpm; no ST segment elevation or depression. Radiographs of the chest performed today revealed no acute cardiopulmonary process; no evidence of peribronchial thickening, areas of consolidation, or focal infiltrates. Symptoms felt to be multifactorial in nature. Suspect symptom constellation related to an acute viral illness and patient's underlying anxiety. Reassurance provided. Discussed that, until ruled out with confirmatory lab testing, SARS-CoV-2 remains part of the differential. Her  testing is pending at this time. I discussed with her that her symptoms are felt to be viral in nature, thus antibiotics would not offer her any relief or improve his symptoms any faster than conservative symptomatic management. Discussed supportive care measures at home during acute phase of illness. Patient to rest as much as possible. She was encouraged to ensure adequate hydration (water and ORS) to prevent dehydration and electrolyte derangements. Patient may use APAP and/or IBU on an as needed basis for pain/fever.    Discussed follow up with primary care physician in 2-3 days for re-evaluation if not improving. I have reviewed the follow up and strict return precautions for any new or worsening symptoms. Patient is aware of symptoms that would be deemed urgent/emergent, and would thus require further evaluation either here or in the  emergency department. At the time of discharge, she verbalized understanding and consent with the discharge plan as it was reviewed with her. All questions were fielded by provider and/or clinic staff prior to patient discharge.    Final Clinical Impressions / Urgent Care Diagnoses:   Final diagnoses:  Viral URI  SOB (shortness of breath)  Encounter for laboratory testing for COVID-19 virus  Anxiety    New Prescriptions:  [place] Controlled Substance Registry consulted? Not Applicable  No orders of the defined types were placed in this encounter.   Recommended Follow up Care:  Patient encouraged to follow up with the following provider within the specified time frame, or sooner as dictated by the severity of her symptoms. As always, she was instructed that for any urgent/emergent care needs, she should seek care either here or in the emergency department for more immediate evaluation.  Follow-up Information    Bacigalupo, Dionne Bucy, MD In 3 days.   Specialty: Family Medicine Why: General reassessment of symptoms if not improving Contact information: [address] [place] [address] [phone]         NOTE: This note was prepared using Dragon dictation software along with smaller phrase technology. Despite my best ability to proofread, there is the potential that transcriptional errors may still occur from this process, and are completely unintentional.   Karen Kitchens, NP 02/18/19 1759

## 2019-03-24 NOTE — Progress Notes (Signed)
Patient: Bonnie Garcia, Female    DOB: [birth year], 54 y.o.   MRN: [id number] Visit Date: 03/24/2019  Today's Provider: Lavon Paganini, MD   Chief Complaint  Patient presents with  . Annual Exam   Subjective:     Annual physical exam Bonnie Garcia is a 54 y.o. female who presents today for health maintenance and complete physical. She feels fairly well. She reports exercising no exercising. She reports she is sleeping fairly well.  ----------------------------------------------------------------- Would like to discuss taking naltrexone- bupropion (contrave) medication. She has gained weight since last visit.  She was interested in this cutting her cravings, as this has been difficult.  She was unable to afford it as it was denied by insurance.  Patient also complaining of lower back pain on left side thinks she may have pulled a muscle when she twisted the wrong way, pain started Wednesday (1/20). Patient has been taking tylenol and ibuprofen and Biofreeze and says it is not helping for the pain. Some radiation to L buttock and hip.  Muscle relaxer helped last time  Last pap: 11/19/18 Colonoscopy: 07/23/15 Mammogram: 10/01/15   Review of Systems  Constitutional: Positive for appetite change.  HENT: Positive for dental problem.   Eyes: Negative.   Respiratory: Negative.   Cardiovascular: Negative.   Gastrointestinal: Positive for constipation and diarrhea.  Endocrine: Positive for polydipsia and polyphagia.  Genitourinary: Negative.   Musculoskeletal: Positive for back pain.  Allergic/Immunologic: Negative.   Neurological: Negative.   Hematological: Negative.   Psychiatric/Behavioral: The patient is nervous/anxious.     Social History      She  reports that she quit smoking about 12 years ago. Her smoking use included cigarettes. She has a 15.00 pack-year smoking history. She has never used smokeless tobacco. She reports current alcohol use. She  reports that she does not use drugs.       Social History   Socioeconomic History  . Marital status: Married    Spouse name: Dellis Filbert  . Number of children: 2  . Years of education: 41  . Highest education level: High school graduate  Occupational History  . Occupation: Control and instrumentation engineer    Employer: [employer]  Tobacco Use  . Smoking status: Former Smoker    Packs/day: 1.00    Years: 15.00    Pack years: 15.00    Types: Cigarettes    Quit date: 2009    Years since quitting: 12.0  . Smokeless tobacco: Never Used  Substance and Sexual Activity  . Alcohol use: Yes    Comment: socially, not even monthly  . Drug use: No  . Sexual activity: Yes    Partners: Male    Birth control/protection: Surgical  Other Topics Concern  . Not on file  Social History Narrative   Pt has 2 biological children, and she adopted her nephew (who committed suicide at age 50).   Social Determinants of Health   Financial Resource Strain:   . Difficulty of Paying Living Expenses: Not on file  Food Insecurity:   . Worried About Charity fundraiser in the Last Year: Not on file  . Ran Out of Food in the Last Year: Not on file  Transportation Needs:   . Lack of Transportation (Medical): Not on file  . Lack of Transportation (Non-Medical): Not on file  Physical Activity:   . Days of Exercise per Week: Not on file  . Minutes of Exercise per Session: Not on file  Stress:   . Feeling of Stress : Not on file  Social Connections:   . Frequency of Communication with Friends and Family: Not on file  . Frequency of Social Gatherings with Friends and Family: Not on file  . Attends Religious Services: Not on file  . Active Member of Clubs or Organizations: Not on file  . Attends Archivist Meetings: Not on file  . Marital Status: Not on file    Past Medical History:  Diagnosis Date  . ADHD (attention deficit hyperactivity disorder)   . Anxiety   . Depression   . Diabetes  mellitus without complication ([place])   . Hypertension   . Kidney stones   . Thyroid nodule      Patient Active Problem List   Diagnosis Date Noted  . Myalgia 12/17/2017  . Thyroid nodule 10/21/2017  . Hyperlipidemia associated with type 2 diabetes mellitus ([place]) 10/21/2017  . PCOS (polycystic ovarian syndrome) 10/21/2017  . Hirsutism 10/21/2017  . Dysuria 10/21/2017  . Severe recurrent major depression without psychotic features ([place]) 09/11/2017  . ADHD (attention deficit hyperactivity disorder) 09/11/2017  . Diabetes mellitus without complication ([place]) [id number]  . Hypertension 09/11/2017  . B12 deficiency 10/13/2013  . Morbid obesity ([place]) 10/13/2013  . Ureteric stone 06/08/2013  . Calculus of kidney 01/21/2013    Past Surgical History:  Procedure Laterality Date  . APPENDECTOMY    . CHOLECYSTECTOMY    . COLONOSCOPY WITH PROPOFOL N/A 07/23/2015   Procedure: COLONOSCOPY WITH PROPOFOL;  Surgeon: Lollie Sails, MD;  Location: [facility] ENDOSCOPY;  Service: Endoscopy;  Laterality: N/A;  . ECTOPIC PREGNANCY SURGERY    . ENDOMETRIAL ABLATION    . ESOPHAGOGASTRODUODENOSCOPY (EGD) WITH PROPOFOL N/A 07/23/2015   Procedure: ESOPHAGOGASTRODUODENOSCOPY (EGD) WITH PROPOFOL;  Surgeon: Lollie Sails, MD;  Location: [facility] ENDOSCOPY;  Service: Endoscopy;  Laterality: N/A;  . TUBAL LIGATION      Family History        Family Status  Relation Name Status  . Ethlyn Daniels  (Not Specified)  . PGM  (Not Specified)  . Mother  Alive  . Father  Alive  . Sister  Alive  . Daughter  Alive  . Son  Alive  . Mat Uncle  (Not Specified)  . MGM  (Not Specified)  . MGF  (Not Specified)  . PGF  (Not Specified)  . Neg Hx  (Not Specified)        Her family history includes Anxiety disorder in her daughter, sister, and son; Arthritis in her sister; Bell's palsy in her father; Breast cancer in her paternal aunt and paternal grandmother; Cataracts in her maternal grandmother; Depression in her daughter,  father, and son; Diabetes in her father, mother, and paternal grandmother; GER disease in her father; Glaucoma in her mother; Heart attack (age of onset: 35) in her maternal grandfather; Heart disease in her father; Hypertension in her father, paternal grandmother, and sister; Kidney disease in her father; Pancreatic cancer in her paternal aunt; Parkinson's disease in her father; Seizures in her father; Skin cancer in her father; Sleep apnea in her father; Stroke in her father; Suicidality in her maternal uncle; Throat cancer in her paternal grandfather; Thyroid cancer in her maternal uncle; Thyroid disease in her paternal aunt. There is no history of Colon cancer, Ovarian cancer, or Cervical cancer.      Allergies  Allergen Reactions  . Erythromycin Other (See Comments)    Reaction:  GI upset   . Penicillins Hives and Other (  See Comments)    Has patient had a PCN reaction causing immediate rash, facial/tongue/throat swelling, SOB or lightheadedness with hypotension: No Has patient had a PCN reaction causing severe rash involving mucus membranes or skin necrosis: No Has patient had a PCN reaction that required hospitalization No Has patient had a PCN reaction occurring within the last 10 years: No If all of the above answers are "NO", then may proceed with Cephalosporin use.     Current Outpatient Medications:  .  ADDERALL XR 25 MG 24 hr capsule, , Disp: , Rfl:  .  ALPRAZolam (XANAX) 0.5 MG tablet, Take 1 tablet by mouth 2 (two) times daily as needed for anxiety., Disp: , Rfl: 3 .  aspirin EC 81 MG tablet, Take 81 mg by mouth daily., Disp: , Rfl:  .  ciprofloxacin-dexamethasone (CIPRODEX) OTIC suspension, Place 4 drops into the right ear 2 (two) times daily., Disp: 7.5 mL, Rfl: 0 .  citalopram (CELEXA) 40 MG tablet, Take 1 tablet (40 mg total) by mouth daily., Disp: 90 tablet, Rfl: 3 .  cloNIDine (CATAPRES) 0.1 MG tablet, , Disp: , Rfl:  .  Dapagliflozin-metFORMIN HCl ER (XIGDUO XR) 07-998 MG  TB24, Take 1 tablet by mouth 2 (two) times daily., Disp: 180 tablet, Rfl: 5 .  gabapentin (NEURONTIN) 300 MG capsule, , Disp: , Rfl:  .  glipiZIDE (GLUCOTROL) 5 MG tablet, Take 1 tablet (5 mg total) by mouth daily. Taken before largest meal of the day, Disp: 90 tablet, Rfl: 1 .  glucose blood test strip, Use as instructed to test blood sugar 2-3 times daily, Disp: 100 each, Rfl: 12 .  hydrOXYzine (ATARAX/VISTARIL) 25 MG tablet, Take 1 tablet (25 mg total) by mouth 3 (three) times daily as needed for anxiety., Disp: 90 tablet, Rfl: 1 .  Insulin Pen Needle (B-D UF III MINI PEN NEEDLES) 31G X 5 MM MISC, Use as directed to inject ozempic weekly, Disp: 30 each, Rfl: 1 .  LORazepam (ATIVAN) 0.5 MG tablet, TK 1 T PO  BID, Disp: , Rfl: 1 .  LORazepam (ATIVAN) 1 MG tablet, TK 1 T PO BID UTD FOR PANIC SYMPTOMS, Disp: , Rfl:  .  Naltrexone-buPROPion HCl ER 8-90 MG TB12, Start 1 tablet every morning for 7 days, then 1 tablet twice daily for 7 days, then 2 tablets every morning and one in the evening, Disp: 120 tablet, Rfl: 3 .  ondansetron (ZOFRAN) 4 MG tablet, Take 1 tablet (4 mg total) by mouth every 8 (eight) hours as needed for nausea or vomiting., Disp: 20 tablet, Rfl: 3 .  ondansetron (ZOFRAN-ODT) 4 MG disintegrating tablet, , Disp: , Rfl:  .  rosuvastatin (CRESTOR) 5 MG tablet, Take 1 tablet (5 mg total) by mouth daily., Disp: 90 tablet, Rfl: 1 .  Semaglutide,0.25 or 0.5MG /DOS, (OZEMPIC, 0.25 OR 0.5 MG/DOSE,) 2 MG/1.5ML SOPN, Inject 0.5 mg into the skin once a week., Disp: 1 pen, Rfl: 4 .  spironolactone (ALDACTONE) 50 MG tablet, Take 1 tablet (50 mg total) by mouth daily., Disp: 90 tablet, Rfl: 3 .  SPRAVATO, 56 MG DOSE, 28 MG/DEVICE SOPK, 1 Dose 2 (two) times a week., Disp: , Rfl:  .  traZODone (DESYREL) 150 MG tablet, , Disp: , Rfl:  .  Vitamin D, Ergocalciferol, (DRISDOL) 1.25 MG (50000 UT) CAPS capsule, TAKE 1 CAPSULE BY MOUTH EVERY FRIDAY, Disp: 12 capsule, Rfl: 1   Patient Care Team: Virginia Crews, MD as PCP - General (Family Medicine)    Objective:  Vitals: There were no vitals taken for this visit.  There were no vitals filed for this visit.   Physical Exam Vitals reviewed.  Constitutional:      General: She is not in acute distress.    Appearance: Normal appearance. She is well-developed. She is obese. She is not diaphoretic.  HENT:     Head: Normocephalic and atraumatic.     Right Ear: Tympanic membrane, ear canal and external ear normal.     Left Ear: Tympanic membrane, ear canal and external ear normal.  Eyes:     General: No scleral icterus.    Conjunctiva/sclera: Conjunctivae normal.     Pupils: Pupils are equal, round, and reactive to light.  Neck:     Thyroid: No thyromegaly.  Cardiovascular:     Rate and Rhythm: Normal rate and regular rhythm.     Heart sounds: Normal heart sounds. No murmur.  Pulmonary:     Effort: Pulmonary effort is normal. No respiratory distress.     Breath sounds: Normal breath sounds. No wheezing or rales.  Abdominal:     General: There is no distension.     Palpations: Abdomen is soft.     Tenderness: There is no abdominal tenderness. There is no guarding or rebound.  Musculoskeletal:        General: No deformity.     Cervical back: Neck supple.     Right lower leg: No edema.     Left lower leg: No edema.     Comments: Back: Antalgic gait.  TTP over L SI joint and bilateral lumbar paraspinal musculature. Negative SLR b/l. Strength and sensation intact in LEs.  Lymphadenopathy:     Cervical: No cervical adenopathy.  Skin:    General: Skin is warm and dry.     Capillary Refill: Capillary refill takes less than 2 seconds.     Findings: No rash.  Neurological:     Mental Status: She is alert and oriented to person, place, and time. Mental status is at baseline.  Psychiatric:        Mood and Affect: Mood normal.        Behavior: Behavior normal.        Thought Content: Thought content normal.      Depression  Screen PHQ 2/9 Scores 12/02/2018 11/18/2017 10/21/2017  PHQ - 2 Score 0 5 6  PHQ- 9 Score - 20 25       Assessment & Plan:     Routine Health Maintenance and Physical Exam  Exercise Activities and Dietary recommendations Goals   None     Immunization History  Administered Date(s) Administered  . Influenza,inj,Quad PF,6+ Mos 10/21/2017, 12/02/2018  . Influenza-Unspecified 02/07/2015  . Pneumococcal Polysaccharide-23 10/21/2017  . Tdap 04/22/2016    Health Maintenance  Topic Date Due  . MAMMOGRAM  09/30/2017  . FOOT EXAM  11/19/2018  . OPHTHALMOLOGY EXAM  11/28/2018  . HEMOGLOBIN A1C  06/02/2019  . PAP SMEAR-Modifier  11/18/2020  . COLONOSCOPY  07/22/2025  . TETANUS/TDAP  04/22/2026  . INFLUENZA VACCINE  Completed  . PNEUMOCOCCAL POLYSACCHARIDE VACCINE AGE 77-64 HIGH RISK  Completed  . HIV Screening  Completed     Discussed health benefits of physical activity, and encouraged her to engage in regular exercise appropriate for her age and condition.    --------------------------------------------------------------------  Problem List Items Addressed This Visit      Cardiovascular and Mediastinum   Hypertension    Uncontrolled Continue spironolactone at current dose Add amlodipine 5  mg daily Discussed low-sodium diet and importance of exercise Recheck metabolic panel Follow-up in 3 months      Relevant Medications   amLODipine (NORVASC) 5 MG tablet     Endocrine   Diabetes mellitus without complication (HCC)    Chronic and uncontrolled A1c now elevated again since last visit Up-to-date on vaccinations Foot exam completed today Advised to schedule eye exam Continue current medications Increase Ozempic to 1 mg weekly Follow-up in 3 months      Relevant Medications   Semaglutide, 1 MG/DOSE, (OZEMPIC, 1 MG/DOSE,) 2 MG/1.5ML SOPN   Other Relevant Orders   POCT HgB A1C (Completed)   Hyperlipidemia associated with type 2 diabetes mellitus (HCC)     Continue Crestor, now taking daily Advised co-Q10 to help with myalgias Repeat lipid panel and CMP      Relevant Medications   Semaglutide, 1 MG/DOSE, (OZEMPIC, 1 MG/DOSE,) 2 MG/1.5ML SOPN   Other Relevant Orders   Lipid panel   Comprehensive metabolic panel     Other   Morbid obesity ([place])    Discussed importance of healthy weight management Discussed diet and exercise Unable to afford brand-name Contrave, so we will use separate dosing of naltrexone and Wellbutrin to mimic Contrave Follow-up in 3 months      Relevant Medications   Semaglutide, 1 MG/DOSE, (OZEMPIC, 1 MG/DOSE,) 2 MG/1.5ML SOPN    Other Visit Diagnoses    Encounter for annual physical exam    -  Primary   Encounter for screening mammogram for malignant neoplasm of breast       Relevant Orders   MM 3D SCREEN BREAST BILATERAL   Elevated hemoglobin (HCC)       Relevant Orders   CBC   Acute left-sided low back pain with left-sided sciatica       Relevant Medications   cyclobenzaprine (FLEXERIL) 10 MG tablet    -New problem -No red flag signs or symptoms -No indication for imaging at this time -Likely a muscular issue -Trial of Flexeril -Continue anti-inflammatories, rest, ice -Home exercise program given -Discussed return precautions   Return in about 3 months (around 06/23/2019) for chronic disease f/u.   The entirety of the information documented in the History of Present Illness, Review of Systems and Physical Exam were personally obtained by me. Portions of this information were initially documented by Johny Shock , CMA and reviewed by me for thoroughness and accuracy.    Jakorian Marengo, Dionne Bucy, MD MPH [facility]

## 2019-03-25 ENCOUNTER — Ambulatory Visit (INDEPENDENT_AMBULATORY_CARE_PROVIDER_SITE_OTHER): Payer: BC Managed Care – PPO | Admitting: Family Medicine

## 2019-03-25 ENCOUNTER — Encounter: Payer: Self-pay | Admitting: Family Medicine

## 2019-03-25 ENCOUNTER — Other Ambulatory Visit: Payer: Self-pay

## 2019-03-25 VITALS — BP 142/89 | HR 108 | Temp 92.3°F | Wt 304.0 lb

## 2019-03-25 DIAGNOSIS — I10 Essential (primary) hypertension: Secondary | ICD-10-CM

## 2019-03-25 DIAGNOSIS — E1169 Type 2 diabetes mellitus with other specified complication: Secondary | ICD-10-CM | POA: Diagnosis not present

## 2019-03-25 DIAGNOSIS — Z0000 Encounter for general adult medical examination without abnormal findings: Secondary | ICD-10-CM | POA: Diagnosis not present

## 2019-03-25 DIAGNOSIS — Z1231 Encounter for screening mammogram for malignant neoplasm of breast: Secondary | ICD-10-CM | POA: Diagnosis not present

## 2019-03-25 DIAGNOSIS — E119 Type 2 diabetes mellitus without complications: Secondary | ICD-10-CM

## 2019-03-25 DIAGNOSIS — M5442 Lumbago with sciatica, left side: Secondary | ICD-10-CM

## 2019-03-25 DIAGNOSIS — E785 Hyperlipidemia, unspecified: Secondary | ICD-10-CM

## 2019-03-25 DIAGNOSIS — D582 Other hemoglobinopathies: Secondary | ICD-10-CM

## 2019-03-25 LAB — POCT GLYCOSYLATED HEMOGLOBIN (HGB A1C)
Estimated Average Glucose: 200
Hemoglobin A1C: 8.6 % — AB (ref 4.0–5.6)

## 2019-03-25 MED ORDER — OZEMPIC (1 MG/DOSE) 2 MG/1.5ML ~~LOC~~ SOPN: 1.0000 mg | PEN_INJECTOR | SUBCUTANEOUS | 3 refills | Status: DC

## 2019-03-25 MED ORDER — AMLODIPINE BESYLATE 5 MG PO TABS: 5.0000 mg | ORAL_TABLET | Freq: Every day | ORAL | 3 refills | Status: DC

## 2019-03-25 MED ORDER — BUPROPION HCL ER (SR) 100 MG PO TB12: ORAL_TABLET | ORAL | 0 refills | Status: DC

## 2019-03-25 MED ORDER — NALTREXONE HCL 50 MG PO TABS: 25.0000 mg | ORAL_TABLET | Freq: Every day | ORAL | 2 refills | Status: DC

## 2019-03-25 MED ORDER — CYCLOBENZAPRINE HCL 10 MG PO TABS: 10.0000 mg | ORAL_TABLET | Freq: Three times a day (TID) | ORAL | 0 refills | Status: DC | PRN

## 2019-03-25 NOTE — Assessment & Plan Note (Signed)
Uncontrolled Continue spironolactone at current dose Add amlodipine 5 mg daily Discussed low-sodium diet and importance of exercise Recheck metabolic panel Follow-up in 3 months

## 2019-03-25 NOTE — Patient Instructions (Addendum)
Call and schedule eye exam and mammogram Increase Ozempic to 10m every week Can start Naltrexone and Wellbutrin (Contrave broken down) 2 weeks after changing Ozempic dose if doing well  Preventive Care 54 years old, Female Preventive care refers to visits with your health care provider and lifestyle choices that can promote health and wellness. This includes:  A yearly physical exam. This may also be called an annual well check.  Regular dental visits and eye exams.  Immunizations.  Screening for certain conditions.  Healthy lifestyle choices, such as eating a healthy diet, getting regular exercise, not using drugs or products that contain nicotine and tobacco, and limiting alcohol use. What can I expect for my preventive care visit? Physical exam Your health care provider will check your:  Height and weight. This may be used to calculate body mass index (BMI), which tells if you are at a healthy weight.  Heart rate and blood pressure.  Skin for abnormal spots. Counseling Your health care provider may ask you questions about your:  Alcohol, tobacco, and drug use.  Emotional well-being.  Home and relationship well-being.  Sexual activity.  Eating habits.  Work and work eStatistician  Method of birth control.  Menstrual cycle.  Pregnancy history. What immunizations do I need?  Influenza (flu) vaccine  This is recommended every year. Tetanus, diphtheria, and pertussis (Tdap) vaccine  You may need a Td booster every 10 years. Varicella (chickenpox) vaccine  You may need this if you have not been vaccinated. Zoster (shingles) vaccine  You may need this after age 53 Measles, mumps, and rubella (MMR) vaccine  You may need at least one dose of MMR if you were born in 1957 or later. You may also need a second dose. Pneumococcal conjugate (PCV13) vaccine  You may need this if you have certain conditions and were not previously vaccinated. Pneumococcal  polysaccharide (PPSV23) vaccine  You may need one or two doses if you smoke cigarettes or if you have certain conditions. Meningococcal conjugate (MenACWY) vaccine  You may need this if you have certain conditions. Hepatitis A vaccine  You may need this if you have certain conditions or if you travel or work in places where you may be exposed to hepatitis A. Hepatitis B vaccine  You may need this if you have certain conditions or if you travel or work in places where you may be exposed to hepatitis B. Haemophilus influenzae type b (Hib) vaccine  You may need this if you have certain conditions. Human papillomavirus (HPV) vaccine  If recommended by your health care provider, you may need three doses over 6 months. You may receive vaccines as individual doses or as more than one vaccine together in one shot (combination vaccines). Talk with your health care provider about the risks and benefits of combination vaccines. What tests do I need? Blood tests  Lipid and cholesterol levels. These may be checked every 5 years, or more frequently if you are over 534years old.  Hepatitis C test.  Hepatitis B test. Screening  Lung cancer screening. You may have this screening every year starting at age 538if you have a 30-pack-year history of smoking and currently smoke or have quit within the past 15 years.  Colorectal cancer screening. All adults should have this screening starting at age 5335and continuing until age 211 Your health care provider may recommend screening at age 5338if you are at increased risk. You will have tests every 1-10 years, depending on your results  and the type of screening test.  Diabetes screening. This is done by checking your blood sugar (glucose) after you have not eaten for a while (fasting). You may have this done every 1-3 years.  Mammogram. This may be done every 1-2 years. Talk with your health care provider about when you should start having regular  mammograms. This may depend on whether you have a family history of breast cancer.  BRCA-related cancer screening. This may be done if you have a family history of breast, ovarian, tubal, or peritoneal cancers.  Pelvic exam and Pap test. This may be done every 3 years starting at age 53. Starting at age 33, this may be done every 5 years if you have a Pap test in combination with an HPV test. Other tests  Sexually transmitted disease (STD) testing.  Bone density scan. This is done to screen for osteoporosis. You may have this scan if you are at high risk for osteoporosis. Follow these instructions at home: Eating and drinking  Eat a diet that includes fresh fruits and vegetables, whole grains, lean protein, and low-fat dairy.  Take vitamin and mineral supplements as recommended by your health care provider.  Do not drink alcohol if: ? Your health care provider tells you not to drink. ? You are pregnant, may be pregnant, or are planning to become pregnant.  If you drink alcohol: ? Limit how much you have to 0-1 drink a day. ? Be aware of how much alcohol is in your drink. In the [place] one drink equals one 12 oz bottle of beer (355 mL), one 5 oz glass of wine (148 mL), or one 1 oz glass of hard liquor (44 mL). Lifestyle  Take daily care of your teeth and gums.  Stay active. Exercise for at least 30 minutes on 5 or more days each week.  Do not use any products that contain nicotine or tobacco, such as cigarettes, e-cigarettes, and chewing tobacco. If you need help quitting, ask your health care provider.  If you are sexually active, practice safe sex. Use a condom or other form of birth control (contraception) in order to prevent pregnancy and STIs (sexually transmitted infections).  If told by your health care provider, take low-dose aspirin daily starting at age 20. What's next?  Visit your health care provider once a year for a well check visit.  Ask your health care provider  how often you should have your eyes and teeth checked.  Stay up to date on all vaccines. This information is not intended to replace advice given to you by your health care provider. Make sure you discuss any questions you have with your health care provider. Document Revised: 10/29/2017 Document Reviewed: 10/29/2017 Elsevier Patient Education  [place].    Back Exercises These exercises help to make your trunk and back strong. They also help to keep the lower back flexible. Doing these exercises can help to prevent back pain or lessen existing pain.  If you have back pain, try to do these exercises 2-3 times each day or as told by your doctor.  As you get better, do the exercises once each day. Repeat the exercises more often as told by your doctor.  To stop back pain from coming back, do the exercises once each day, or as told by your doctor. Exercises Single knee to chest Do these steps 3-5 times in a row for each leg: 1. Lie on your back on a firm bed or the floor  with your legs stretched out. 2. Bring one knee to your chest. 3. Grab your knee or thigh with both hands and hold them it in place. 4. Pull on your knee until you feel a gentle stretch in your lower back or buttocks. 5. Keep doing the stretch for 10-30 seconds. 6. Slowly let go of your leg and straighten it. Pelvic tilt Do these steps 5-10 times in a row: 1. Lie on your back on a firm bed or the floor with your legs stretched out. 2. Bend your knees so they point up to the ceiling. Your feet should be flat on the floor. 3. Tighten your lower belly (abdomen) muscles to press your lower back against the floor. This will make your tailbone point up to the ceiling instead of pointing down to your feet or the floor. 4. Stay in this position for 5-10 seconds while you gently tighten your muscles and breathe evenly. Cat-cow Do these steps until your lower back bends more easily: 1. Get on your hands and knees on a  firm surface. Keep your hands under your shoulders, and keep your knees under your hips. You may put padding under your knees. 2. Let your head hang down toward your chest. Tighten (contract) the muscles in your belly. Point your tailbone toward the floor so your lower back becomes rounded like the back of a cat. 3. Stay in this position for 5 seconds. 4. Slowly lift your head. Let the muscles of your belly relax. Point your tailbone up toward the ceiling so your back forms a sagging arch like the back of a cow. 5. Stay in this position for 5 seconds.  Press-ups Do these steps 5-10 times in a row: 1. Lie on your belly (face-down) on the floor. 2. Place your hands near your head, about shoulder-width apart. 3. While you keep your back relaxed and keep your hips on the floor, slowly straighten your arms to raise the top half of your body and lift your shoulders. Do not use your back muscles. You may change where you place your hands in order to make yourself more comfortable. 4. Stay in this position for 5 seconds. 5. Slowly return to lying flat on the floor.  Bridges Do these steps 10 times in a row: 1. Lie on your back on a firm surface. 2. Bend your knees so they point up to the ceiling. Your feet should be flat on the floor. Your arms should be flat at your sides, next to your body. 3. Tighten your butt muscles and lift your butt off the floor until your waist is almost as high as your knees. If you do not feel the muscles working in your butt and the back of your thighs, slide your feet 1-2 inches farther away from your butt. 4. Stay in this position for 3-5 seconds. 5. Slowly lower your butt to the floor, and let your butt muscles relax. If this exercise is too easy, try doing it with your arms crossed over your chest. Belly crunches Do these steps 5-10 times in a row: 1. Lie on your back on a firm bed or the floor with your legs stretched out. 2. Bend your knees so they point up to the  ceiling. Your feet should be flat on the floor. 3. Cross your arms over your chest. 4. Tip your chin a little bit toward your chest but do not bend your neck. 5. Tighten your belly muscles and slowly raise your chest just  enough to lift your shoulder blades a tiny bit off of the floor. Avoid raising your body higher than that, because it can put too much stress on your low back. 6. Slowly lower your chest and your head to the floor. Back lifts Do these steps 5-10 times in a row: 1. Lie on your belly (face-down) with your arms at your sides, and rest your forehead on the floor. 2. Tighten the muscles in your legs and your butt. 3. Slowly lift your chest off of the floor while you keep your hips on the floor. Keep the back of your head in line with the curve in your back. Look at the floor while you do this. 4. Stay in this position for 3-5 seconds. 5. Slowly lower your chest and your face to the floor. Contact a doctor if:  Your back pain gets a lot worse when you do an exercise.  Your back pain does not get better 2 hours after you exercise. If you have any of these problems, stop doing the exercises. Do not do them again unless your doctor says it is okay. Get help right away if:  You have sudden, very bad back pain. If this happens, stop doing the exercises. Do not do them again unless your doctor says it is okay. This information is not intended to replace advice given to you by your health care provider. Make sure you discuss any questions you have with your health care provider. Document Revised: 11/12/2017 Document Reviewed: 11/12/2017 Elsevier Patient Education  2020 [employer].

## 2019-03-25 NOTE — Assessment & Plan Note (Signed)
Continue Crestor, now taking daily Advised co-Q10 to help with myalgias Repeat lipid panel and CMP

## 2019-03-25 NOTE — Assessment & Plan Note (Signed)
Discussed importance of healthy weight management Discussed diet and exercise Unable to afford brand-name Contrave, so we will use separate dosing of naltrexone and Wellbutrin to mimic Contrave Follow-up in 3 months

## 2019-03-25 NOTE — Assessment & Plan Note (Signed)
Chronic and uncontrolled A1c now elevated again since last visit Up-to-date on vaccinations Foot exam completed today Advised to schedule eye exam Continue current medications Increase Ozempic to 1 mg weekly Follow-up in 3 months

## 2019-03-26 LAB — LIPID PANEL
Chol/HDL Ratio: 3.7 ratio (ref 0.0–4.4)
Cholesterol, Total: 173 mg/dL (ref 100–199)
HDL: 47 mg/dL (ref >39)
LDL Chol Calc (NIH): 84 mg/dL (ref 0–99)
Triglycerides: 252 mg/dL — ABNORMAL HIGH (ref 0–149)
VLDL Cholesterol Cal: 42 mg/dL — ABNORMAL HIGH (ref 5–40)

## 2019-03-26 LAB — COMPREHENSIVE METABOLIC PANEL
ALT: 46 IU/L — ABNORMAL HIGH (ref 0–32)
AST: 78 IU/L — ABNORMAL HIGH (ref 0–40)
Albumin/Globulin Ratio: 2 (ref 1.2–2.2)
Albumin: 4.5 g/dL (ref 3.8–4.9)
Alkaline Phosphatase: 56 IU/L (ref 39–117)
BUN/Creatinine Ratio: 12 (ref 9–23)
BUN: 8 mg/dL (ref 6–24)
Bilirubin Total: <0.2 mg/dL (ref 0.0–1.2)
CO2: 20 mmol/L (ref 20–29)
Calcium: 9.6 mg/dL (ref 8.7–10.2)
Chloride: 97 mmol/L (ref 96–106)
Creatinine, Ser: 0.67 mg/dL (ref 0.57–1.00)
GFR calc Af Amer: 117 mL/min/{1.73_m2} (ref >59)
GFR calc non Af Amer: 101 mL/min/{1.73_m2} (ref >59)
Globulin, Total: 2.3 g/dL (ref 1.5–4.5)
Glucose: 217 mg/dL — ABNORMAL HIGH (ref 65–99)
Potassium: 4.4 mmol/L (ref 3.5–5.2)
Sodium: 137 mmol/L (ref 134–144)
Total Protein: 6.8 g/dL (ref 6.0–8.5)

## 2019-03-26 LAB — CBC
Hematocrit: 47.4 % — ABNORMAL HIGH (ref 34.0–46.6)
Hemoglobin: 16.1 g/dL — ABNORMAL HIGH (ref 11.1–15.9)
MCH: 30.6 pg (ref 26.6–33.0)
MCHC: 34 g/dL (ref 31.5–35.7)
MCV: 90 fL (ref 79–97)
Platelets: 367 10*3/uL (ref 150–450)
RBC: 5.27 x10E6/uL (ref 3.77–5.28)
RDW: 12.2 % (ref 11.7–15.4)
WBC: 11.2 10*3/uL — ABNORMAL HIGH (ref 3.4–10.8)

## 2019-03-28 ENCOUNTER — Telehealth: Payer: Self-pay

## 2019-03-28 NOTE — Telephone Encounter (Signed)
    Result Notes and Comments to Patient Comment seen by patient Bonnie Garcia on 03/28/2019 10:25 AM EST

## 2019-03-28 NOTE — Telephone Encounter (Signed)
-----   Message from Virginia Crews, MD sent at 03/28/2019  8:13 AM EST ----- Cholesterol is improving. Continue Crestor at current dose and will recheck at next visit.  Increase in liver function tests and WBC.  Could have some viral process brewing.  Repeat CBC and CMP in 1 month.

## 2019-04-19 ENCOUNTER — Other Ambulatory Visit: Payer: Self-pay | Admitting: Family Medicine

## 2019-04-19 MED ORDER — BUPROPION HCL ER (SR) 100 MG PO TB12: 200.0000 mg | ORAL_TABLET | Freq: Two times a day (BID) | ORAL | 5 refills | Status: DC

## 2019-04-19 NOTE — Telephone Encounter (Signed)
Please review.  This was prescribed for weight loss.    Thanks,   -Mickel Baas

## 2019-04-19 NOTE — Telephone Encounter (Signed)
[facility] faxed refill request for the following medications:  buPROPion (WELLBUTRIN SR) 100 MG 12 hr tablet   Please advise.  Thanks, [employer]

## 2019-05-09 ENCOUNTER — Other Ambulatory Visit: Payer: Self-pay | Admitting: Family Medicine

## 2019-05-09 MED ORDER — CITALOPRAM HYDROBROMIDE 40 MG PO TABS: 40.0000 mg | ORAL_TABLET | Freq: Every day | ORAL | 1 refills | Status: DC

## 2019-05-09 NOTE — Telephone Encounter (Signed)
[facility] faxed refill request for the following medications:  citalopram (CELEXA) 40 MG tablet   Please advise.  Thanks, [employer]

## 2019-05-16 ENCOUNTER — Other Ambulatory Visit: Payer: Self-pay | Admitting: Family Medicine

## 2019-05-16 NOTE — Telephone Encounter (Signed)
[facility] faxed refill request for the following medications:  Vitamin D, Ergocalciferol, (DRISDOL) 1.25 MG (50000 UT) CAPS capsule   Please advise.  Thanks, [employer]

## 2019-05-17 MED ORDER — VITAMIN D (ERGOCALCIFEROL) 1.25 MG (50000 UNIT) PO CAPS: ORAL_CAPSULE | ORAL | 1 refills | Status: DC

## 2019-05-31 ENCOUNTER — Other Ambulatory Visit: Payer: Self-pay | Admitting: Family Medicine

## 2019-05-31 MED ORDER — ROSUVASTATIN CALCIUM 5 MG PO TABS: 5.0000 mg | ORAL_TABLET | Freq: Every day | ORAL | 1 refills | Status: DC

## 2019-05-31 NOTE — Telephone Encounter (Signed)
[facility] faxed refill request for the following medications:  rosuvastatin (CRESTOR) 5 MG tablet    Please advise.  Thanks, [employer]

## 2019-06-01 ENCOUNTER — Other Ambulatory Visit: Payer: Self-pay | Admitting: Family Medicine

## 2019-06-01 MED ORDER — GLIPIZIDE 5 MG PO TABS: 5.0000 mg | ORAL_TABLET | Freq: Every day | ORAL | 1 refills | Status: DC

## 2019-06-01 NOTE — Telephone Encounter (Signed)
[facility] faxed refill request for the following medications:   glipiZIDE (GLUCOTROL) 5 MG tablet   Please advise.  Thanks, [employer]

## 2019-06-18 ENCOUNTER — Ambulatory Visit: Payer: BC Managed Care – PPO

## 2019-06-20 NOTE — Progress Notes (Signed)
Established patient visit    Patient: Bonnie Garcia   DOB: October 06, 1965   54 y.o. Female  MRN: [id number] Visit Date: 06/23/2019  Today's healthcare provider: Lavon Paganini, MD   Chief Complaint  Patient presents with  . Diabetes  . Hypertension  . Back Pain   Subjective    HPI Diabetes Mellitus Type II, Follow-up  Lab Results  Component Value Date   HGBA1C 8.6 (A) 03/25/2019   HGBA1C 7.8 (A) 12/02/2018   HGBA1C 7.9 (A) 06/23/2018   Last seen for diabetes 3 months ago.  Management since then includes increase Ozempic to 1 mg weekly. She reports excellent compliance with treatment. She is not having side effects.  Symptoms: No fatigue No foot ulcerations Yes appetite changes No nausea No paresthesia (numbness or tingling) of the feet  Yes polydipsia (excessive thirst) No polyuria (frequent urination) No visual disturbances  No vomiting  Home blood sugar records: not being checked in the last month  Episodes of hypoglycemia? No    Current insulin regiment: none Most Recent Eye Exam: not scheduled Current exercise: none Current diet habits: in general, an "unhealthy" diet  Pertinent Labs: Lab Results  Component Value Date   CHOL 173 03/25/2019   HDL 47 03/25/2019   LDLCALC 84 03/25/2019   TRIG 252 (H) 03/25/2019   CHOLHDL 3.7 03/25/2019   Lab Results  Component Value Date   NA 137 03/25/2019   K 4.4 03/25/2019   CL 97 03/25/2019   CO2 20 03/25/2019   GLUCOSE 217 (H) 03/25/2019   BUN 8 03/25/2019   CREATININE 0.67 03/25/2019   CALCIUM 9.6 03/25/2019   GFRNONAA 101 03/25/2019   GFRAA 117 03/25/2019     Wt Readings from Last 3 Encounters:  06/23/19 (!) 302 lb (137 kg)  03/25/19 (!) 304 lb (137.9 kg)  02/17/19 290 lb (131.5 kg)   Hypertension: Patient here for follow-up of elevated blood pressure. She is not exercising and is not adherent to low salt diet.  Blood pressure is well controlled at home. Cardiac symptoms none. Patient  denies chest pain, fatigue, lower extremity edema and palpitations.  Cardiovascular risk factors: diabetes mellitus, hypertension and obesity (BMI >= 30 kg/m2). Use of agents associated with hypertension: none. History of target organ damage: none.  Patient C/O persistent back pain. Patient reports she was taking cyclobenzaprine, reports no pain improvement. Patient reports she helped her daughter move. And she picked up a heavy box, and symptoms got worse.  ----------------------------------------------------------------------------------------- Social History   Tobacco Use  . Smoking status: Former Smoker    Packs/day: 1.00    Years: 15.00    Pack years: 15.00    Types: Cigarettes    Quit date: 2009    Years since quitting: 12.3  . Smokeless tobacco: Never Used  Substance Use Topics  . Alcohol use: Yes    Comment: socially, not even monthly  . Drug use: No       Medications: Outpatient Medications Prior to Visit  Medication Sig  . ADDERALL XR 25 MG 24 hr capsule   . ALPRAZolam (XANAX) 0.5 MG tablet Take 1 tablet by mouth 2 (two) times daily as needed for anxiety.  Marland Kitchen aspirin EC 81 MG tablet Take 81 mg by mouth daily.  Marland Kitchen buPROPion (WELLBUTRIN SR) 100 MG 12 hr tablet Take 2 tablets (200 mg total) by mouth 2 (two) times daily.  . citalopram (CELEXA) 40 MG tablet Take 1 tablet (40 mg total) by mouth daily.  Marland Kitchen  cloNIDine (CATAPRES) 0.1 MG tablet Take 0.1 mg by mouth as needed.   . Dapagliflozin-metFORMIN HCl ER (XIGDUO XR) 07-998 MG TB24 Take 1 tablet by mouth 2 (two) times daily.  Marland Kitchen gabapentin (NEURONTIN) 300 MG capsule Take 300 mg by mouth 3 (three) times daily.   Marland Kitchen glipiZIDE (GLUCOTROL) 5 MG tablet Take 1 tablet (5 mg total) by mouth daily. Taken before largest meal of the day  . glucose blood test strip Use as instructed to test blood sugar 2-3 times daily  . Insulin Pen Needle (B-D UF III MINI PEN NEEDLES) 31G X 5 MM MISC Use as directed to inject ozempic weekly  . LORazepam  (ATIVAN) 0.5 MG tablet TK 1 T PO  BID  . LORazepam (ATIVAN) 1 MG tablet TK 1 T PO BID UTD FOR PANIC SYMPTOMS  . ondansetron (ZOFRAN-ODT) 4 MG disintegrating tablet   . rosuvastatin (CRESTOR) 5 MG tablet Take 1 tablet (5 mg total) by mouth daily.  . Semaglutide, 1 MG/DOSE, (OZEMPIC, 1 MG/DOSE,) 2 MG/1.5ML SOPN Inject 1 mg into the skin once a week.  Marland Kitchen SPRAVATO, 56 MG DOSE, 28 MG/DEVICE SOPK 1 Dose 2 (two) times a week.  . traZODone (DESYREL) 150 MG tablet   . Vitamin D, Ergocalciferol, (DRISDOL) 1.25 MG (50000 UNIT) CAPS capsule TAKE 1 CAPSULE BY MOUTH EVERY FRIDAY  . [DISCONTINUED] amLODipine (NORVASC) 5 MG tablet Take 1 tablet (5 mg total) by mouth daily.  . [DISCONTINUED] naltrexone (DEPADE) 50 MG tablet Take 0.5 tablets (25 mg total) by mouth daily.  Marland Kitchen spironolactone (ALDACTONE) 50 MG tablet Take 1 tablet (50 mg total) by mouth daily. (Patient not taking: Reported on 06/23/2019)  . [DISCONTINUED] cyclobenzaprine (FLEXERIL) 10 MG tablet Take 1 tablet (10 mg total) by mouth 3 (three) times daily as needed for muscle spasms. (Patient not taking: Reported on 06/23/2019)   No facility-administered medications prior to visit.    Review of Systems  Constitutional: Negative.   Eyes: Negative.   Respiratory: Negative.   Cardiovascular: Negative.   Endocrine: Negative.     Last CBC Lab Results  Component Value Date   WBC 11.2 (H) 03/25/2019   HGB 16.1 (H) 03/25/2019   HCT 47.4 (H) 03/25/2019   MCV 90 03/25/2019   MCH 30.6 03/25/2019   RDW 12.2 03/25/2019   PLT 367 99991111   Last metabolic panel Lab Results  Component Value Date   GLUCOSE 217 (H) 03/25/2019   NA 137 03/25/2019   K 4.4 03/25/2019   CL 97 03/25/2019   CO2 20 03/25/2019   BUN 8 03/25/2019   CREATININE 0.67 03/25/2019   GFRNONAA 101 03/25/2019   GFRAA 117 03/25/2019   CALCIUM 9.6 03/25/2019   PROT 6.8 03/25/2019   ALBUMIN 4.5 03/25/2019   LABGLOB 2.3 03/25/2019   AGRATIO 2.0 03/25/2019   BILITOT <0.2  03/25/2019   ALKPHOS 56 03/25/2019   AST 78 (H) 03/25/2019   ALT 46 (H) 03/25/2019   ANIONGAP 16 (H) 09/10/2017   Last lipids Lab Results  Component Value Date   CHOL 173 03/25/2019   HDL 47 03/25/2019   LDLCALC 84 03/25/2019   TRIG 252 (H) 03/25/2019   CHOLHDL 3.7 03/25/2019   Last hemoglobin A1c Lab Results  Component Value Date   HGBA1C 8.6 (A) 03/25/2019       Objective    BP 122/83 (BP Location: Left Arm, Patient Position: Sitting, Cuff Size: Large)   Pulse 91   Temp (!) 96.8 F (36 C) (Temporal)  Resp 16   Ht 5\' 11"  (1.803 m)   Wt (!) 302 lb (137 kg)   BMI 42.12 kg/m  BP Readings from Last 3 Encounters:  06/23/19 122/83  03/25/19 (!) 142/89  02/17/19 (!) 152/99   Wt Readings from Last 3 Encounters:  06/23/19 (!) 302 lb (137 kg)  03/25/19 (!) 304 lb (137.9 kg)  02/17/19 290 lb (131.5 kg)      Physical Exam Vitals reviewed.  Constitutional:      General: She is not in acute distress.    Appearance: Normal appearance. She is well-developed. She is not diaphoretic.  HENT:     Head: Normocephalic and atraumatic.  Eyes:     General: No scleral icterus.    Conjunctiva/sclera: Conjunctivae normal.  Neck:     Thyroid: No thyromegaly.  Cardiovascular:     Rate and Rhythm: Normal rate and regular rhythm.     Pulses: Normal pulses.     Heart sounds: Normal heart sounds. No murmur.  Pulmonary:     Effort: Pulmonary effort is normal. No respiratory distress.     Breath sounds: Normal breath sounds. No wheezing, rhonchi or rales.  Musculoskeletal:        General: No deformity.     Cervical back: Neck supple.     Right lower leg: No edema.     Left lower leg: No edema.     Comments: Back: no midline TTP bilateral lumbar peraspinal. Muscle spasm and TTP Negative SLR bilateral  Lymphadenopathy:     Cervical: No cervical adenopathy.  Skin:    General: Skin is warm and dry.     Findings: No rash.  Neurological:     Mental Status: She is alert and  oriented to person, place, and time. Mental status is at baseline.     Sensory: No sensory deficit.     Motor: No weakness.  Psychiatric:        Mood and Affect: Mood normal.        Behavior: Behavior normal.      No results found for any visits on 06/23/19.   Assessment & Plan     Problem List Items Addressed This Visit      Cardiovascular and Mediastinum   Hypertension    Well controlled Discontinue Amlodipine Recheck metabolic panel F/u in 3 months       Relevant Orders   CBC with Differential/Platelet   Comprehensive metabolic panel     Endocrine   Diabetes mellitus ([place]) - Primary    Uncontrolled with last A1c 8.6 Continue current medications UTD on vaccines,foot exam On ACEi On Statin Discussed diet and exercise F/u in 3 months       Relevant Orders   Hemoglobin A1c   Hyperlipidemia associated with type 2 diabetes mellitus ([facility])    Previously well controlled Continue statin Repeat FLP and CMP Goal LDL < 70       Relevant Orders   Lipid Panel With LDL/HDL Ratio     Other   Morbid obesity ([place])    Discussed importance of healthy weight management Discussed diet and exercise Increase Naltrexone to 0.5 (25mg ) twice daily      Acute bilateral low back pain without sciatica    Persistent No radicular symptoms or red flags Failed cyclobenzaprine Start Meloxicam 15mg  daily Back pain exercises given If pain is not improving will refer to PT      Relevant Medications   meloxicam (MOBIC) 15 MG tablet  Return in about 3 months (around 09/22/2019) for chronic disease f/u.      I, Lavon Paganini, MD, have reviewed all documentation for this visit. The documentation on 06/23/19 for the exam, diagnosis, procedures, and orders are all accurate and complete.   Erin Uecker, Dionne Bucy, MD, MPH [facility]

## 2019-06-21 ENCOUNTER — Other Ambulatory Visit: Payer: Self-pay | Admitting: Family Medicine

## 2019-06-21 MED ORDER — NALTREXONE HCL 50 MG PO TABS: 25.0000 mg | ORAL_TABLET | Freq: Every day | ORAL | 2 refills | Status: DC

## 2019-06-21 NOTE — Telephone Encounter (Signed)
[facility] faxed refill request for the following medications:  naltrexone (DEPADE) 50 MG tablet   Please advise.

## 2019-06-23 ENCOUNTER — Encounter: Payer: Self-pay | Admitting: Family Medicine

## 2019-06-23 ENCOUNTER — Ambulatory Visit (INDEPENDENT_AMBULATORY_CARE_PROVIDER_SITE_OTHER): Payer: BC Managed Care – PPO | Admitting: Family Medicine

## 2019-06-23 ENCOUNTER — Other Ambulatory Visit: Payer: Self-pay

## 2019-06-23 VITALS — BP 122/83 | HR 91 | Temp 96.8°F | Resp 16 | Ht 71.0 in | Wt 302.0 lb

## 2019-06-23 DIAGNOSIS — E785 Hyperlipidemia, unspecified: Secondary | ICD-10-CM

## 2019-06-23 DIAGNOSIS — E1169 Type 2 diabetes mellitus with other specified complication: Secondary | ICD-10-CM

## 2019-06-23 DIAGNOSIS — M545 Low back pain, unspecified: Secondary | ICD-10-CM | POA: Insufficient documentation

## 2019-06-23 DIAGNOSIS — I10 Essential (primary) hypertension: Secondary | ICD-10-CM | POA: Diagnosis not present

## 2019-06-23 MED ORDER — NALTREXONE HCL 50 MG PO TABS: 25.0000 mg | ORAL_TABLET | Freq: Two times a day (BID) | ORAL | 2 refills | Status: DC

## 2019-06-23 MED ORDER — MELOXICAM 15 MG PO TABS: 15.0000 mg | ORAL_TABLET | Freq: Every day | ORAL | 0 refills | Status: DC

## 2019-06-23 NOTE — Assessment & Plan Note (Signed)
Discussed importance of healthy weight management Discussed diet and exercise Increase Naltrexone to 0.5 (25mg ) twice daily

## 2019-06-23 NOTE — Assessment & Plan Note (Signed)
Uncontrolled with last A1c 8.6 Continue current medications UTD on vaccines,foot exam On ACEi On Statin Discussed diet and exercise F/u in 3 months

## 2019-06-23 NOTE — Assessment & Plan Note (Signed)
Previously well controlled Continue statin Repeat FLP and CMP Goal LDL < 70 

## 2019-06-23 NOTE — Patient Instructions (Signed)

## 2019-06-23 NOTE — Assessment & Plan Note (Signed)
Well controlled Discontinue Amlodipine Recheck metabolic panel F/u in 3 months

## 2019-06-23 NOTE — Assessment & Plan Note (Addendum)
Persistent No radicular symptoms or red flags Failed cyclobenzaprine Start Meloxicam 15mg  daily Back pain exercises given If pain is not improving will refer to PT

## 2019-06-24 ENCOUNTER — Telehealth: Payer: Self-pay

## 2019-06-24 LAB — CBC WITH DIFFERENTIAL/PLATELET
Basophils Absolute: 0.1 10*3/uL (ref 0.0–0.2)
Basos: 1 %
EOS (ABSOLUTE): 0.3 10*3/uL (ref 0.0–0.4)
Eos: 4 %
Hematocrit: 45.9 % (ref 34.0–46.6)
Hemoglobin: 15.8 g/dL (ref 11.1–15.9)
Immature Grans (Abs): 0 10*3/uL (ref 0.0–0.1)
Immature Granulocytes: 0 %
Lymphocytes Absolute: 2.7 10*3/uL (ref 0.7–3.1)
Lymphs: 32 %
MCH: 31.2 pg (ref 26.6–33.0)
MCHC: 34.4 g/dL (ref 31.5–35.7)
MCV: 91 fL (ref 79–97)
Monocytes Absolute: 0.5 10*3/uL (ref 0.1–0.9)
Monocytes: 6 %
Neutrophils Absolute: 4.8 10*3/uL (ref 1.4–7.0)
Neutrophils: 57 %
Platelets: 361 10*3/uL (ref 150–450)
RBC: 5.07 x10E6/uL (ref 3.77–5.28)
RDW: 12.4 % (ref 11.7–15.4)
WBC: 8.4 10*3/uL (ref 3.4–10.8)

## 2019-06-24 LAB — COMPREHENSIVE METABOLIC PANEL
ALT: 29 IU/L (ref 0–32)
AST: 30 IU/L (ref 0–40)
Albumin/Globulin Ratio: 2 (ref 1.2–2.2)
Albumin: 4.6 g/dL (ref 3.8–4.9)
Alkaline Phosphatase: 54 IU/L (ref 39–117)
BUN/Creatinine Ratio: 13 (ref 9–23)
BUN: 9 mg/dL (ref 6–24)
Bilirubin Total: 0.2 mg/dL (ref 0.0–1.2)
CO2: 21 mmol/L (ref 20–29)
Calcium: 9.6 mg/dL (ref 8.7–10.2)
Chloride: 98 mmol/L (ref 96–106)
Creatinine, Ser: 0.72 mg/dL (ref 0.57–1.00)
GFR calc Af Amer: 111 mL/min/{1.73_m2} (ref >59)
GFR calc non Af Amer: 97 mL/min/{1.73_m2} (ref >59)
Globulin, Total: 2.3 g/dL (ref 1.5–4.5)
Glucose: 223 mg/dL — ABNORMAL HIGH (ref 65–99)
Potassium: 4.4 mmol/L (ref 3.5–5.2)
Sodium: 136 mmol/L (ref 134–144)
Total Protein: 6.9 g/dL (ref 6.0–8.5)

## 2019-06-24 LAB — HEMOGLOBIN A1C
Est. average glucose Bld gHb Est-mCnc: 171 mg/dL
Hgb A1c MFr Bld: 7.6 % — ABNORMAL HIGH (ref 4.8–5.6)

## 2019-06-24 LAB — LIPID PANEL WITH LDL/HDL RATIO
Cholesterol, Total: 154 mg/dL (ref 100–199)
HDL: 45 mg/dL (ref >39)
LDL Chol Calc (NIH): 64 mg/dL (ref 0–99)
LDL/HDL Ratio: 1.4 ratio (ref 0.0–3.2)
Triglycerides: 283 mg/dL — ABNORMAL HIGH (ref 0–149)
VLDL Cholesterol Cal: 45 mg/dL — ABNORMAL HIGH (ref 5–40)

## 2019-06-24 NOTE — Telephone Encounter (Signed)
Patient advised as below. Patient verbalizes understanding and is in agreement with treatment plan.  

## 2019-06-24 NOTE — Telephone Encounter (Signed)
-----   Message from Virginia Crews, MD sent at 06/24/2019  9:26 AM EDT ----- Normal labs, except A1c is still elevated some.  It is near goal, so will leave current meds.  Work on [employer] in diet and will recheck at next visit

## 2019-06-24 NOTE — Telephone Encounter (Signed)
LMTCB 06/24/2019.  PEC please advise pt of lab results below.   Thanks,   -Mickel Baas

## 2019-07-20 ENCOUNTER — Telehealth: Payer: Self-pay

## 2019-07-20 ENCOUNTER — Other Ambulatory Visit: Payer: Self-pay | Admitting: Family Medicine

## 2019-07-20 DIAGNOSIS — M545 Low back pain, unspecified: Secondary | ICD-10-CM

## 2019-07-20 MED ORDER — MELOXICAM 15 MG PO TABS: 15.0000 mg | ORAL_TABLET | Freq: Every day | ORAL | 5 refills | Status: DC

## 2019-07-20 MED ORDER — OZEMPIC (1 MG/DOSE) 2 MG/1.5ML ~~LOC~~ SOPN: 1.0000 mg | PEN_INJECTOR | SUBCUTANEOUS | 3 refills | Status: DC

## 2019-07-20 NOTE — Telephone Encounter (Signed)
Refill for Ozempic sent to [facility].

## 2019-07-20 NOTE — Telephone Encounter (Signed)
[facility] faxed refill request for the following medications:  meloxicam (MOBIC) 15 MG tablet   Please advise.  Thanks, [employer]

## 2019-09-23 ENCOUNTER — Ambulatory Visit: Payer: Self-pay | Admitting: Family Medicine

## 2019-10-11 ENCOUNTER — Other Ambulatory Visit: Payer: Self-pay | Admitting: Family Medicine

## 2019-10-11 NOTE — Telephone Encounter (Signed)
Requested medication (s) are due for refill today: yes  Requested medication (s) are on the active medication list: yes  Last refill:  09/13/2019  Future visit scheduled: no  Notes to clinic: this refill cannot be delegated    Requested Prescriptions  Pending Prescriptions Disp Refills   naltrexone (DEPADE) 50 MG tablet [Pharmacy Med Name: NALTREXONE 50MG  TABLETS] 30 tablet 2    Sig: TAKE 1/2 TABLET(25 MG) BY MOUTH IN THE MORNING AND AT BEDTIME      Not Delegated - Psychiatry: Drug Dependence Therapy - naltrexone Failed - 10/11/2019  1:21 PM      Failed - This refill cannot be delegated      Passed - ALT in normal range and within 360 days    ALT  Date Value Ref Range Status  06/23/2019 29 0 - 32 IU/L Final   SGPT (ALT)  Date Value Ref Range Status  01/17/2013 34 12 - 78 U/L Final          Passed - AST in normal range and within 360 days    AST  Date Value Ref Range Status  06/23/2019 30 0 - 40 IU/L Final   SGOT(AST)  Date Value Ref Range Status  01/17/2013 16 15 - 44 Unit/L Final          Passed - Valid encounter within last 12 months    Recent Outpatient Visits           3 months ago Type 2 diabetes mellitus with other specified complication, without long-term current use of insulin ([place])   [facility] [place], Dionne Bucy, MD   6 months ago Encounter for annual physical exam   [facility] [place], Dionne Bucy, MD   10 months ago Type 2 diabetes mellitus with other specified complication, without long-term current use of insulin [facility])   [facility], Dionne Bucy, MD   11 months ago Cough   [facility] [place], Dionne Bucy, MD   1 year ago Type 2 diabetes mellitus with hyperglycemia, without long-term current use of insulin [facility])   [facility], Dionne Bucy, MD

## 2019-10-17 ENCOUNTER — Other Ambulatory Visit: Payer: Self-pay | Admitting: Family Medicine

## 2019-10-27 ENCOUNTER — Other Ambulatory Visit: Payer: Self-pay | Admitting: Family Medicine

## 2019-10-27 NOTE — Telephone Encounter (Signed)
Requested medication (s) are due for refill today - unsure  Requested medication (s) are on the active medication list -yes  Future visit scheduled -no  Last refill: 08/06/19  Notes to clinic: Request for non delegated Rx  Requested Prescriptions  Pending Prescriptions Disp Refills   Vitamin D, Ergocalciferol, (DRISDOL) 1.25 MG (50000 UNIT) CAPS capsule [Pharmacy Med Name: VITAMIN D2 50,000IU (ERGO) CAP RX] 12 capsule 1    Sig: TAKE 1 CAPSULE BY MOUTH [place]      Endocrinology:  Vitamins - Vitamin D Supplementation Failed - 10/27/2019  8:25 AM      Failed - 50,000 IU strengths are not delegated      Failed - Phosphate in normal range and within 360 days    No results found for: PHOS        Passed - Ca in normal range and within 360 days    Calcium  Date Value Ref Range Status  06/23/2019 9.6 8.7 - 10.2 mg/dL Final   Calcium, Total  Date Value Ref Range Status  06/16/2013 8.6 8.5 - 10.1 mg/dL Final          Passed - Vitamin D in normal range and within 360 days    Vit D, 25-Hydroxy  Date Value Ref Range Status  12/02/2018 46.5 30.0 - 100.0 ng/mL Final    Comment:    Vitamin D deficiency has been defined by the Institute of Medicine and an Endocrine Society practice guideline as a level of serum 25-OH vitamin D less than 20 ng/mL (1,2). The Endocrine Society went on to further define vitamin D insufficiency as a level between 21 and 29 ng/mL (2). 1. IOM (Institute of Medicine). 2010. Dietary reference    intakes for calcium and D. [place]: The    [employer]. 2. Holick MF, Binkley [place], Bischoff-Ferrari HA, et al.    Evaluation, treatment, and prevention of vitamin D    deficiency: an Endocrine Society clinical practice    guideline. JCEM. 2011 Jul; 96(7):1911-30.           Passed - Valid encounter within last 12 months    Recent Outpatient Visits           4 months ago Type 2 diabetes mellitus with other specified complication, without  long-term current use of insulin ([place])   [facility] [place], Dionne Bucy, MD   7 months ago Encounter for annual physical exam   [facility], Dionne Bucy, MD   10 months ago Type 2 diabetes mellitus with other specified complication, without long-term current use of insulin [facility])   [facility], Dionne Bucy, MD   1 year ago Cough   [facility] [place], Dionne Bucy, MD   1 year ago Type 2 diabetes mellitus with hyperglycemia, without long-term current use of insulin ([place])   [facility], Dionne Bucy, MD                  Requested Prescriptions  Pending Prescriptions Disp Refills   Vitamin D, Ergocalciferol, (DRISDOL) 1.25 MG (50000 UNIT) CAPS capsule [Pharmacy Med Name: VITAMIN D2 50,000IU (ERGO) CAP RX] 12 capsule 1    Sig: TAKE 1 CAPSULE BY MOUTH EVERY [place]      Endocrinology:  Vitamins - Vitamin D Supplementation Failed - 10/27/2019  8:25 AM      Failed - 50,000 IU strengths are not delegated      Failed - Phosphate in normal range  and within 360 days    No results found for: PHOS        Passed - Ca in normal range and within 360 days    Calcium  Date Value Ref Range Status  06/23/2019 9.6 8.7 - 10.2 mg/dL Final   Calcium, Total  Date Value Ref Range Status  06/16/2013 8.6 8.5 - 10.1 mg/dL Final          Passed - Vitamin D in normal range and within 360 days    Vit D, 25-Hydroxy  Date Value Ref Range Status  12/02/2018 46.5 30.0 - 100.0 ng/mL Final    Comment:    Vitamin D deficiency has been defined by the [place] practice guideline as a level of serum 25-OH vitamin D less than 20 ng/mL (1,2). The Endocrine Society went on to further define vitamin D insufficiency as a level between 21 and 29 ng/mL (2). 1. IOM (Institute of Medicine). 2010. Dietary reference    intakes for calcium and D. [place]: The    [employer]. 2. Holick MF, Binkley [place], Bischoff-Ferrari HA, et al.    Evaluation, treatment, and prevention of vitamin D    deficiency: an Endocrine Society clinical practice    guideline. JCEM. 2011 Jul; 96(7):1911-30.           Passed - Valid encounter within last 12 months    Recent Outpatient Visits           4 months ago Type 2 diabetes mellitus with other specified complication, without long-term current use of insulin [facility])   [facility] [place], Dionne Bucy, MD   7 months ago Encounter for annual physical exam   [facility], Dionne Bucy, MD   10 months ago Type 2 diabetes mellitus with other specified complication, without long-term current use of insulin [facility])   [facility], Dionne Bucy, MD   1 year ago Cough   [facility] [place], Dionne Bucy, MD   1 year ago Type 2 diabetes mellitus with hyperglycemia, without long-term current use of insulin [facility])   [facility], Dionne Bucy, MD

## 2019-11-03 ENCOUNTER — Other Ambulatory Visit: Payer: Self-pay | Admitting: Family Medicine

## 2019-11-03 MED ORDER — CITALOPRAM HYDROBROMIDE 40 MG PO TABS: 40.0000 mg | ORAL_TABLET | Freq: Every day | ORAL | 0 refills | Status: DC

## 2019-11-03 NOTE — Telephone Encounter (Signed)
[facility] faxed refill request for the following medications:  citalopram (CELEXA) 40 MG tablet  90 day supply  Last Rx: 05/09/2019 90 day supply with 1 refill LOV: 06/23/2019 Please advise. Thanks TNP

## 2019-11-07 ENCOUNTER — Other Ambulatory Visit: Payer: Self-pay | Admitting: Family Medicine

## 2019-11-24 ENCOUNTER — Other Ambulatory Visit: Payer: Self-pay | Admitting: Family Medicine

## 2019-11-24 MED ORDER — ROSUVASTATIN CALCIUM 5 MG PO TABS: 5.0000 mg | ORAL_TABLET | Freq: Every day | ORAL | 1 refills | Status: DC

## 2019-11-24 NOTE — Telephone Encounter (Signed)
[facility] faxed refill request for the following medications:  rosuvastatin (CRESTOR) 5 MG tablet  90 day supply Last Rx: 05/31/2019 LOV: 06/23/2019 Please advise. Thanks TNP

## 2019-12-12 ENCOUNTER — Other Ambulatory Visit: Payer: Self-pay

## 2019-12-12 ENCOUNTER — Encounter: Payer: Self-pay | Admitting: Family Medicine

## 2019-12-12 ENCOUNTER — Ambulatory Visit (INDEPENDENT_AMBULATORY_CARE_PROVIDER_SITE_OTHER): Payer: BC Managed Care – PPO | Admitting: Family Medicine

## 2019-12-12 VITALS — BP 154/82 | HR 89 | Temp 98.5°F | Ht 71.0 in | Wt 297.0 lb

## 2019-12-12 DIAGNOSIS — I10 Essential (primary) hypertension: Secondary | ICD-10-CM | POA: Diagnosis not present

## 2019-12-12 DIAGNOSIS — Z23 Encounter for immunization: Secondary | ICD-10-CM

## 2019-12-12 DIAGNOSIS — M545 Low back pain, unspecified: Secondary | ICD-10-CM | POA: Diagnosis not present

## 2019-12-12 DIAGNOSIS — E282 Polycystic ovarian syndrome: Secondary | ICD-10-CM

## 2019-12-12 DIAGNOSIS — E1169 Type 2 diabetes mellitus with other specified complication: Secondary | ICD-10-CM

## 2019-12-12 DIAGNOSIS — F332 Major depressive disorder, recurrent severe without psychotic features: Secondary | ICD-10-CM

## 2019-12-12 DIAGNOSIS — E119 Type 2 diabetes mellitus without complications: Secondary | ICD-10-CM | POA: Diagnosis not present

## 2019-12-12 DIAGNOSIS — E785 Hyperlipidemia, unspecified: Secondary | ICD-10-CM

## 2019-12-12 DIAGNOSIS — Z794 Long term (current) use of insulin: Secondary | ICD-10-CM | POA: Diagnosis not present

## 2019-12-12 LAB — POCT GLYCOSYLATED HEMOGLOBIN (HGB A1C): Hemoglobin A1C: 7.7 % — AB (ref 4.0–5.6)

## 2019-12-12 MED ORDER — ONDANSETRON 4 MG PO TBDP: 4.0000 mg | ORAL_TABLET | Freq: Three times a day (TID) | ORAL | 3 refills | Status: DC | PRN

## 2019-12-12 MED ORDER — OZEMPIC (1 MG/DOSE) 2 MG/1.5ML ~~LOC~~ SOPN
2.0000 mg | PEN_INJECTOR | SUBCUTANEOUS | 3 refills | Status: DC
Start: 2019-12-12 — End: 2020-02-13

## 2019-12-12 MED ORDER — VITAMIN D (ERGOCALCIFEROL) 1.25 MG (50000 UNIT) PO CAPS
50000.0000 [IU] | ORAL_CAPSULE | ORAL | 3 refills | Status: DC
Start: 2019-12-12 — End: 2020-04-30

## 2019-12-12 MED ORDER — CITALOPRAM HYDROBROMIDE 40 MG PO TABS: 40.0000 mg | ORAL_TABLET | Freq: Every day | ORAL | 1 refills | Status: DC

## 2019-12-12 MED ORDER — MELOXICAM 15 MG PO TABS: 15.0000 mg | ORAL_TABLET | Freq: Every day | ORAL | 5 refills | Status: DC

## 2019-12-12 MED ORDER — ROSUVASTATIN CALCIUM 5 MG PO TABS
5.0000 mg | ORAL_TABLET | Freq: Every day | ORAL | 1 refills | Status: DC
Start: 2019-12-12 — End: 2020-04-30

## 2019-12-12 MED ORDER — XIGDUO XR 5-1000 MG PO TB24: 1.0000 | ORAL_TABLET | Freq: Two times a day (BID) | ORAL | 5 refills | Status: DC

## 2019-12-12 NOTE — Patient Instructions (Signed)
   The CDC recommends two doses of Shingrix (the shingles vaccine) separated by 2 to 6 months for adults age 53 years and older. I recommend checking with your insurance plan regarding coverage for this vaccine.   

## 2019-12-12 NOTE — Progress Notes (Signed)
Established patient visit   Patient: Bonnie Garcia   DOB: [birth year]   54 y.o. Female  MRN: [id number] Visit Date: 12/12/2019  Today's healthcare provider: Lavon Paganini, MD   Chief Complaint  Patient presents with  . Diabetes  . Hyperlipidemia  . Hypertension   Subjective    HPI  Diabetes Mellitus Type II, follow-up  Lab Results  Component Value Date   HGBA1C 7.7 (A) 12/12/2019   HGBA1C 7.6 (H) 06/23/2019   HGBA1C 8.6 (A) 03/25/2019   Last seen for diabetes 6 months ago.  Management since then includes continuing the same treatment. She reports excellent compliance with treatment. She is not having side effects.   Home blood sugar records: are not being checked  Episodes of hypoglycemia? No    Current insulin regiment:  Most Recent Eye Exam: pt is due for an eye exam   --------------------------------------------------------------------------------------------------- Hypertension, follow-up  BP Readings from Last 3 Encounters:  12/12/19 (!) 154/82  06/23/19 122/83  03/25/19 (!) 142/89   Wt Readings from Last 3 Encounters:  12/12/19 297 lb (134.7 kg)  06/23/19 (!) 302 lb (137 kg)  03/25/19 (!) 304 lb (137.9 kg)     She was last seen for hypertension 6 months ago.  BP at that visit was 122/83. Management since that visit includes no changes. She reports excellent compliance with treatment. She is not having side effects.  She is not exercising. She is not adherent to low salt diet.   Outside blood pressures are are not being checked.  She does not smoke.  Use of agents associated with hypertension: none.   --------------------------------------------------------------------------------------------------- Lipid/Cholesterol, follow-up  Last Lipid Panel: Lab Results  Component Value Date   CHOL 154 06/23/2019   LDLCALC 64 06/23/2019   HDL 45 06/23/2019   TRIG 283 (H) 06/23/2019    She was last seen for this 6 months ago.    Management since that visit includes no changes.  She reports excellent compliance with treatment. She is not having side effects.   Symptoms: No appetite changes No foot ulcerations  No chest pain No chest pressure/discomfort  No dyspnea No orthopnea  No fatigue No lower extremity edema  No palpitations No paroxysmal nocturnal dyspnea  No nausea No numbness or tingling of extremity  No polydipsia No polyuria  No speech difficulty No syncope   She is following a Regular diet. Current exercise: none  Last metabolic panel Lab Results  Component Value Date   GLUCOSE 223 (H) 06/23/2019   NA 136 06/23/2019   K 4.4 06/23/2019   BUN 9 06/23/2019   CREATININE 0.72 06/23/2019   GFRNONAA 97 06/23/2019   GFRAA 111 06/23/2019   CALCIUM 9.6 06/23/2019   AST 30 06/23/2019   ALT 29 06/23/2019   The 10-year ASCVD risk score Mikey Bussing DC Jr., et al., 2013) is: 5.6%  ---------------------------------------------------------------------------------------------------   Patient Active Problem List   Diagnosis Date Noted  . Acute bilateral low back pain without sciatica 06/23/2019  . Myalgia 12/17/2017  . Thyroid nodule 10/21/2017  . Hyperlipidemia associated with type 2 diabetes mellitus ([place]) 10/21/2017  . PCOS (polycystic ovarian syndrome) 10/21/2017  . Hirsutism 10/21/2017  . Dysuria 10/21/2017  . Severe recurrent major depression without psychotic features ([place]) 09/11/2017  . ADHD (attention deficit hyperactivity disorder) 09/11/2017  . Diabetes mellitus ([place]) 09/11/2017  . Hypertension 09/11/2017  . B12 deficiency 10/13/2013  . Morbid obesity ([place]) 10/13/2013  . Ureteric stone 06/08/2013  . Calculus of kidney 01/21/2013  Past Medical History:  Diagnosis Date  . ADHD (attention deficit hyperactivity disorder)   . Anxiety   . Depression   . Diabetes mellitus without complication ([place])   . Hypertension   . Kidney stones   . Thyroid nodule    Social History   Tobacco Use   . Smoking status: Former Smoker    Packs/day: 1.00    Years: 15.00    Pack years: 15.00    Types: Cigarettes    Quit date: 2009    Years since quitting: 12.7  . Smokeless tobacco: Never Used  Vaping Use  . Vaping Use: Never used  Substance Use Topics  . Alcohol use: Yes    Comment: socially, not even monthly  . Drug use: No   Allergies  Allergen Reactions  . Erythromycin Other (See Comments)    Reaction:  GI upset   . Penicillins Hives and Other (See Comments)    Has patient had a PCN reaction causing immediate rash, facial/tongue/throat swelling, SOB or lightheadedness with hypotension: No Has patient had a PCN reaction causing severe rash involving mucus membranes or skin necrosis: No Has patient had a PCN reaction that required hospitalization No Has patient had a PCN reaction occurring within the last 10 years: No If all of the above answers are "NO", then may proceed with Cephalosporin use.     Medications: Outpatient Medications Prior to Visit  Medication Sig  . ADDERALL XR 25 MG 24 hr capsule   . aspirin EC 81 MG tablet Take 81 mg by mouth daily.  . cloNIDine (CATAPRES) 0.1 MG tablet Take 0.1 mg by mouth as needed.   . gabapentin (NEURONTIN) 300 MG capsule Take 300 mg by mouth 3 (three) times daily.   Marland Kitchen glucose blood test strip Use as instructed to test blood sugar 2-3 times daily  . Insulin Pen Needle (B-D UF III MINI PEN NEEDLES) 31G X 5 MM MISC Use as directed to inject ozempic weekly  . LORazepam (ATIVAN) 0.5 MG tablet TK 1 T PO  BID  . LORazepam (ATIVAN) 1 MG tablet TK 1 T PO BID UTD FOR PANIC SYMPTOMS  . SPRAVATO, 56 MG DOSE, 28 MG/DEVICE SOPK 1 Dose 2 (two) times a week.  . traZODone (DESYREL) 150 MG tablet   . [DISCONTINUED] buPROPion (WELLBUTRIN SR) 100 MG 12 hr tablet TAKE 2 TABLETS(200 MG) BY MOUTH TWICE DAILY  . [DISCONTINUED] citalopram (CELEXA) 40 MG tablet TAKE 1 TABLET BY MOUTH DAILY. DISCONTINUE 20MG  DOSING  . [DISCONTINUED] Dapagliflozin-metFORMIN  HCl ER (XIGDUO XR) 07-998 MG TB24 Take 1 tablet by mouth 2 (two) times daily.  . [DISCONTINUED] glipiZIDE (GLUCOTROL) 5 MG tablet Take 1 tablet (5 mg total) by mouth daily. Taken before largest meal of the day  . [DISCONTINUED] meloxicam (MOBIC) 15 MG tablet Take 1 tablet (15 mg total) by mouth daily.  . [DISCONTINUED] naltrexone (DEPADE) 50 MG tablet TAKE 1/2 TABLET(25 MG) BY MOUTH IN THE MORNING AND AT BEDTIME  . [DISCONTINUED] ondansetron (ZOFRAN-ODT) 4 MG disintegrating tablet   . [DISCONTINUED] rosuvastatin (CRESTOR) 5 MG tablet Take 1 tablet (5 mg total) by mouth daily.  . [DISCONTINUED] Semaglutide, 1 MG/DOSE, (OZEMPIC, 1 MG/DOSE,) 2 MG/1.5ML SOPN Inject 1 mg into the skin once a week.  . [DISCONTINUED] Vitamin D, Ergocalciferol, (DRISDOL) 1.25 MG (50000 UNIT) CAPS capsule TAKE 1 CAPSULE BY MOUTH EVERY FRIDAY  . [DISCONTINUED] ALPRAZolam (XANAX) 0.5 MG tablet Take 1 tablet by mouth 2 (two) times daily as needed for anxiety. (Patient not  taking: Reported on 12/12/2019)  . [DISCONTINUED] spironolactone (ALDACTONE) 50 MG tablet Take 1 tablet (50 mg total) by mouth daily. (Patient not taking: Reported on 06/23/2019)   No facility-administered medications prior to visit.    Review of Systems  Constitutional: Negative.   Respiratory: Negative.   Cardiovascular: Negative.   Genitourinary: Negative.   Neurological: Negative.       Objective    BP (!) 154/82 (BP Location: Right Arm, Patient Position: Sitting, Cuff Size: Large)   Pulse 89   Temp 98.5 F (36.9 C) (Oral)   Ht 5\' 11"  (1.803 m)   Wt 297 lb (134.7 kg)   SpO2 98%   BMI 41.42 kg/m    Physical Exam Vitals reviewed.  Constitutional:      General: She is not in acute distress.    Appearance: Normal appearance. She is well-developed. She is not diaphoretic.  HENT:     Head: Normocephalic and atraumatic.  Eyes:     General: No scleral icterus.    Conjunctiva/sclera: Conjunctivae normal.  Neck:     Thyroid: No  thyromegaly.  Cardiovascular:     Rate and Rhythm: Normal rate and regular rhythm.     Pulses: Normal pulses.     Heart sounds: Normal heart sounds. No murmur heard.   Pulmonary:     Effort: Pulmonary effort is normal. No respiratory distress.     Breath sounds: Normal breath sounds. No wheezing, rhonchi or rales.  Musculoskeletal:     Cervical back: Neck supple.     Right lower leg: No edema.     Left lower leg: No edema.  Lymphadenopathy:     Cervical: No cervical adenopathy.  Skin:    General: Skin is warm and dry.     Findings: No rash.  Neurological:     Mental Status: She is alert and oriented to person, place, and time. Mental status is at baseline.  Psychiatric:        Mood and Affect: Mood normal.        Behavior: Behavior normal.       Results for orders placed or performed in visit on 12/12/19  POCT glycosylated hemoglobin (Hb A1C)  Result Value Ref Range   Hemoglobin A1C 7.7 (A) 4.0 - 5.6 %    Assessment & Plan     Problem List Items Addressed This Visit      Cardiovascular and Mediastinum   Hypertension    Uncontrolled today She believes this is related to anxiety Her blood pressure is well controlled at all of her psychiatric visits and at home and has previously been well controlled here No changes to medications today She is currently not on antihypertensives for her blood pressure at this time Reviewed recent labs Follow-up in 3 months      Relevant Medications   rosuvastatin (CRESTOR) 5 MG tablet     Endocrine   Diabetes mellitus ([place]) - Primary    Uncontrolled hyperglycemia with A1c 7.7 ROI sent for last eye exam Foot exam up-to-date On statin Vaccines up-to-date Discontinue glipizide as she has been off of this for the last few weeks anyways Increase Ozempic to 2 mg weekly Follow-up in 3 months and recheck A1c       Relevant Medications   Dapagliflozin-metFORMIN HCl ER (XIGDUO XR) 07-998 MG TB24   rosuvastatin (CRESTOR) 5 MG tablet    Semaglutide, 1 MG/DOSE, (OZEMPIC, 1 MG/DOSE,) 2 MG/1.5ML SOPN   Other Relevant Orders   POCT glycosylated hemoglobin (Hb A1C) (  Completed)   Hyperlipidemia associated with type 2 diabetes mellitus ([place])    Previously well controlled Continue statin Reviewed last FLP and CMP Goal LDL < 70      Relevant Medications   Dapagliflozin-metFORMIN HCl ER (XIGDUO XR) 07-998 MG TB24   rosuvastatin (CRESTOR) 5 MG tablet   Semaglutide, 1 MG/DOSE, (OZEMPIC, 1 MG/DOSE,) 2 MG/1.5ML SOPN   PCOS (polycystic ovarian syndrome)    No longer on spironolactone given hair loss side effect        Other   Severe recurrent major depression without psychotic features ([place])    Followed by psychiatry and undergoing Spravato treatments twice weekly She has had significant improvement with her depression from this treatment She has tried multiple different medications in the past Her other psychiatric medications are managed by psychiatry as well      Relevant Medications   citalopram (CELEXA) 40 MG tablet   Morbid obesity ([place])    Discussed importance of healthy weight management Discussed diet and exercise No weight loss with bupropion and naltrexone (generic Contrave) We will discontinue these medications We will increase Ozempic to 2 mg weekly as above for diabetes and weight loss      Relevant Medications   Dapagliflozin-metFORMIN HCl ER (XIGDUO XR) 07-998 MG TB24   Semaglutide, 1 MG/DOSE, (OZEMPIC, 1 MG/DOSE,) 2 MG/1.5ML SOPN   Acute bilateral low back pain without sciatica   Relevant Medications   meloxicam (MOBIC) 15 MG tablet    Other Visit Diagnoses    Need for influenza vaccination       Relevant Orders   Flu Vaccine QUAD 6+ mos PF IM (Fluarix Quad PF) (Completed)       Return in about 3 months (around 03/13/2020) for CPE (after 03/24/20).      I, Lavon Paganini, MD, have reviewed all documentation for this visit. The documentation on 12/13/19 for the exam, diagnosis, procedures,  and orders are all accurate and complete.   Cristina Mattern, Dionne Bucy, MD, MPH [facility]

## 2019-12-13 ENCOUNTER — Encounter: Payer: Self-pay | Admitting: Family Medicine

## 2019-12-13 NOTE — Assessment & Plan Note (Signed)
Uncontrolled today She believes this is related to anxiety Her blood pressure is well controlled at all of her psychiatric visits and at home and has previously been well controlled here No changes to medications today She is currently not on antihypertensives for her blood pressure at this time Reviewed recent labs Follow-up in 3 months

## 2019-12-13 NOTE — Assessment & Plan Note (Signed)
No longer on spironolactone given hair loss side effect

## 2019-12-13 NOTE — Assessment & Plan Note (Signed)
Followed by psychiatry and undergoing Spravato treatments twice weekly She has had significant improvement with her depression from this treatment She has tried multiple different medications in the past Her other psychiatric medications are managed by psychiatry as well

## 2019-12-13 NOTE — Assessment & Plan Note (Signed)
Previously well controlled Continue statin Reviewed last FLP and CMP Goal LDL < 70

## 2019-12-13 NOTE — Assessment & Plan Note (Signed)
Uncontrolled hyperglycemia with A1c 7.7 ROI sent for last eye exam Foot exam up-to-date On statin Vaccines up-to-date Discontinue glipizide as she has been off of this for the last few weeks anyways Increase Ozempic to 2 mg weekly Follow-up in 3 months and recheck A1c

## 2019-12-13 NOTE — Assessment & Plan Note (Signed)
Discussed importance of healthy weight management Discussed diet and exercise No weight loss with bupropion and naltrexone (generic Contrave) We will discontinue these medications We will increase Ozempic to 2 mg weekly as above for diabetes and weight loss

## 2019-12-15 ENCOUNTER — Telehealth: Payer: Self-pay | Admitting: Family Medicine

## 2019-12-15 NOTE — Telephone Encounter (Signed)
Per initial encounter, "Medication Refill - Medication: Pt states that PCP is increasing her dosage to 2 MG for Semaglutide, 1 MG/DOSE, (OZEMPIC, 1 MG/DOSE,) 2 MG/1.5ML SOPN   Has the patient contacted their pharmacy? Yes.   (Agent: If no, request that the patient contact the pharmacy for the refill.) (Agent: If yes, when and what did the pharmacy advise?)  Preferred Pharmacy (with phone number or street name):  [facility] [place], [place] - [address]  [place] [address]  Phone: [phone] Fax: [phone]     Agent: Please be advised that RX refills may take up to 3 business days. We ask that you follow-up with your pharmacy."  Will route to office for final disposition.

## 2019-12-15 NOTE — Telephone Encounter (Signed)
Medication Refill - Medication: Pt states that PCP is increasing her dosage to 2 MG for Semaglutide, 1 MG/DOSE, (OZEMPIC, 1 MG/DOSE,) 2 MG/1.5ML SOPN   Has the patient contacted their pharmacy? Yes.   (Agent: If no, request that the patient contact the pharmacy for the refill.) (Agent: If yes, when and what did the pharmacy advise?)  Preferred Pharmacy (with phone number or street name):  [facility] [place], [place] - [address]  [place] [address]  Phone: [phone] Fax: [phone]     Agent: Please be advised that RX refills may take up to 3 business days. We ask that you follow-up with your pharmacy.

## 2019-12-16 ENCOUNTER — Encounter: Payer: Self-pay | Admitting: Family Medicine

## 2019-12-19 NOTE — Telephone Encounter (Signed)
Prescription was filled on 12/12/19 for Ozempic it states in sig to inject 2mg  once a week. Refill request not appropriate at this time. KW

## 2019-12-30 ENCOUNTER — Ambulatory Visit
Admission: RE | Admit: 2019-12-30 | Discharge: 2019-12-30 | Disposition: A | Discharge status: Home / Self Care | Payer: BC Managed Care – PPO | Source: Ambulatory Visit | Attending: Internal Medicine | Admitting: Internal Medicine

## 2019-12-30 ENCOUNTER — Other Ambulatory Visit: Payer: Self-pay

## 2019-12-30 VITALS — BP 162/92 | HR 109 | Temp 99.4°F | Resp 16 | Ht 70.0 in | Wt 285.0 lb

## 2019-12-30 DIAGNOSIS — R350 Frequency of micturition: Secondary | ICD-10-CM | POA: Diagnosis not present

## 2019-12-30 DIAGNOSIS — R300 Dysuria: Secondary | ICD-10-CM

## 2019-12-30 DIAGNOSIS — N3001 Acute cystitis with hematuria: Secondary | ICD-10-CM | POA: Insufficient documentation

## 2019-12-30 DIAGNOSIS — R112 Nausea with vomiting, unspecified: Secondary | ICD-10-CM | POA: Diagnosis not present

## 2019-12-30 DIAGNOSIS — N200 Calculus of kidney: Secondary | ICD-10-CM | POA: Insufficient documentation

## 2019-12-30 DIAGNOSIS — M549 Dorsalgia, unspecified: Secondary | ICD-10-CM | POA: Diagnosis not present

## 2019-12-30 LAB — URINALYSIS, COMPLETE (UACMP) WITH MICROSCOPIC
Bilirubin Urine: NEGATIVE
Glucose, UA: 500 mg/dL — AB
Ketones, ur: NEGATIVE mg/dL
Leukocytes,Ua: NEGATIVE
Nitrite: NEGATIVE
Protein, ur: NEGATIVE mg/dL
Specific Gravity, Urine: <1.005 — ABNORMAL LOW (ref 1.005–1.030)
WBC, UA: >50 WBC/hpf (ref 0–5)
pH: 5 (ref 5.0–8.0)

## 2019-12-30 LAB — GLUCOSE, CAPILLARY: Glucose-Capillary: 262 mg/dL — ABNORMAL HIGH (ref 70–99)

## 2019-12-30 MED ORDER — TAMSULOSIN HCL 0.4 MG PO CAPS: 0.4000 mg | ORAL_CAPSULE | Freq: Every day | ORAL | 0 refills | Status: AC

## 2019-12-30 MED ORDER — ONDANSETRON 4 MG PO TBDP: 4.0000 mg | ORAL_TABLET | Freq: Three times a day (TID) | ORAL | 0 refills | Status: AC | PRN

## 2019-12-30 MED ORDER — HYDROCODONE-ACETAMINOPHEN 5-325 MG PO TABS
1.0000 | ORAL_TABLET | ORAL | 0 refills | Status: AC | PRN
Start: 2019-12-30 — End: 2020-01-04

## 2019-12-30 MED ORDER — SULFAMETHOXAZOLE-TRIMETHOPRIM 800-160 MG PO TABS: 1.0000 | ORAL_TABLET | Freq: Two times a day (BID) | ORAL | 0 refills | Status: AC

## 2019-12-30 MED ORDER — FLUCONAZOLE 150 MG PO TABS: ORAL_TABLET | ORAL | 0 refills | Status: DC

## 2019-12-30 NOTE — ED Provider Notes (Signed)
[facility]    CSN: [id number] Arrival date & time: 12/30/19  1841      History   Chief Complaint Chief Complaint  Patient presents with  . Dysuria  . Back Pain    HPI Bonnie Garcia is a 54 y.o. female presenting for right sided flank pain off and on for the past week.  Patient also admits to urinary frequency and urgency as well as some slight burning with urination. Denies hematuria. Denies any abnormal vaginal discharge or itching. Patient has a history of kidney stones. She says her pain may feel similar to when she had a kidney stone in the past. Has been taking 800 mg ibuprofen without relief.  She is currently being treated with clindamycin for a dental infection. Patient denies any associated fever, fatigue, chills, sweats, abdominal pain. Past medical history significant for type 2 diabetes, hypertension, PCOS, obesity, renal stones. She has no other complaints or concerns today.  HPI  Past Medical History:  Diagnosis Date  . ADHD (attention deficit hyperactivity disorder)   . Anxiety   . Depression   . Diabetes mellitus without complication ([place])   . Hypertension   . Kidney stones   . Thyroid nodule     Patient Active Problem List   Diagnosis Date Noted  . Acute bilateral low back pain without sciatica 06/23/2019  . Myalgia 12/17/2017  . Thyroid nodule 10/21/2017  . Hyperlipidemia associated with type 2 diabetes mellitus ([place]) 10/21/2017  . PCOS (polycystic ovarian syndrome) 10/21/2017  . Hirsutism 10/21/2017  . Dysuria 10/21/2017  . Severe recurrent major depression without psychotic features ([place]) 09/11/2017  . ADHD (attention deficit hyperactivity disorder) 09/11/2017  . Diabetes mellitus ([place]) 09/11/2017  . Hypertension 09/11/2017  . B12 deficiency 10/13/2013  . Morbid obesity ([place]) 10/13/2013  . Ureteric stone 06/08/2013  . Calculus of kidney 01/21/2013    Past Surgical History:  Procedure Laterality Date  . APPENDECTOMY    .  CHOLECYSTECTOMY    . COLONOSCOPY WITH PROPOFOL N/A 07/23/2015   Procedure: COLONOSCOPY WITH PROPOFOL;  Surgeon: Lollie Sails, MD;  Location: [facility] ENDOSCOPY;  Service: Endoscopy;  Laterality: N/A;  . ECTOPIC PREGNANCY SURGERY    . ENDOMETRIAL ABLATION    . ESOPHAGOGASTRODUODENOSCOPY (EGD) WITH PROPOFOL N/A 07/23/2015   Procedure: ESOPHAGOGASTRODUODENOSCOPY (EGD) WITH PROPOFOL;  Surgeon: Lollie Sails, MD;  Location: [facility] ENDOSCOPY;  Service: Endoscopy;  Laterality: N/A;  . TUBAL LIGATION      OB History   No obstetric history on file.      Home Medications    Prior to Admission medications   Medication Sig Start Date End Date Taking? Authorizing Provider  ADDERALL XR 25 MG 24 hr capsule  09/16/18  Yes [provider]  aspirin EC 81 MG tablet Take 81 mg by mouth daily.   Yes [provider]  citalopram (CELEXA) 40 MG tablet Take 1 tablet (40 mg total) by mouth daily. 12/12/19  Yes Bacigalupo, Dionne Bucy, MD  Dapagliflozin-metFORMIN HCl ER (XIGDUO XR) 07-998 MG TB24 Take 1 tablet by mouth 2 (two) times daily. 12/12/19  Yes Bacigalupo, Dionne Bucy, MD  gabapentin (NEURONTIN) 300 MG capsule Take 300 mg by mouth 3 (three) times daily.  11/04/18  Yes [provider]  LORazepam (ATIVAN) 0.5 MG tablet TK 1 T PO  BID 11/12/17  Yes [provider]  rosuvastatin (CRESTOR) 5 MG tablet Take 1 tablet (5 mg total) by mouth daily. 12/12/19  Yes Bacigalupo, Dionne Bucy, MD  Semaglutide,  1 MG/DOSE, (OZEMPIC, 1 MG/DOSE,) 2 MG/1.5ML SOPN Inject 2 mg into the skin once a week. 12/12/19  Yes Bacigalupo, Dionne Bucy, MD  SPRAVATO, 56 MG DOSE, 28 MG/DEVICE SOPK 1 Dose 2 (two) times a week. 10/13/17  Yes [provider]  traZODone (DESYREL) 150 MG tablet  11/09/18  Yes [provider]  Vitamin D, Ergocalciferol, (DRISDOL) 1.25 MG (50000 UNIT) CAPS capsule Take 1 capsule (50,000 Units total) by mouth every 7 (seven) days. 12/12/19  Yes Bacigalupo, Dionne Bucy, MD   cloNIDine (CATAPRES) 0.1 MG tablet Take 0.1 mg by mouth as needed.  11/08/18   [provider]  fluconazole (DIFLUCAN) 150 MG tablet Take 1 tab PO q72 h for yeast infection 12/30/19   Laurene Footman B, PA-C  glucose blood test strip Use as instructed to test blood sugar 2-3 times daily 06/23/18   Virginia Crews, MD  HYDROcodone-acetaminophen (NORCO/VICODIN) 5-325 MG tablet Take 1 tablet by mouth every 4 (four) hours as needed for up to 5 days. 12/30/19 01/04/20  Laurene Footman B, PA-C  Insulin Pen Needle (B-D UF III MINI PEN NEEDLES) 31G X 5 MM MISC Use as directed to inject ozempic weekly 12/02/18   Bacigalupo, Dionne Bucy, MD  LORazepam (ATIVAN) 1 MG tablet TK 1 T PO BID UTD FOR PANIC SYMPTOMS 10/19/18   [provider]  meloxicam (MOBIC) 15 MG tablet Take 1 tablet (15 mg total) by mouth daily. 12/12/19   Virginia Crews, MD  ondansetron (ZOFRAN ODT) 4 MG disintegrating tablet Take 1 tablet (4 mg total) by mouth every 8 (eight) hours as needed for up to 7 days for nausea or vomiting. 12/30/19 01/06/20  Laurene Footman B, PA-C  sulfamethoxazole-trimethoprim (BACTRIM DS) 800-160 MG tablet Take 1 tablet by mouth 2 (two) times daily for 7 days. 12/30/19 01/06/20  Danton Clap, PA-C  tamsulosin (FLOMAX) 0.4 MG CAPS capsule Take 1 capsule (0.4 mg total) by mouth daily after breakfast. 12/30/19 01/29/20  Danton Clap, PA-C    Family History Family History  Problem Relation Age of Onset  . Breast cancer Paternal Aunt   . Pancreatic cancer Paternal Aunt   . Thyroid disease Paternal Aunt   . Breast cancer Paternal Grandmother   . Diabetes Paternal Grandmother   . Hypertension Paternal Grandmother   . Diabetes Mother   . Glaucoma Mother   . Diabetes Father   . Hypertension Father   . Heart disease Father   . Parkinson's disease Father   . Kidney disease Father   . Skin cancer Father   . Depression Father   . Stroke Father   . Seizures Father   . GER disease Father   .  Sleep apnea Father   . Bell's palsy Father   . Anxiety disorder Sister   . Hypertension Sister   . Arthritis Sister   . Anxiety disorder Daughter   . Depression Daughter   . Depression Son   . Anxiety disorder Son   . Thyroid cancer Maternal Uncle   . Suicidality Maternal Uncle   . Cataracts Maternal Grandmother   . Heart attack Maternal Grandfather 42  . Throat cancer Paternal Grandfather   . Colon cancer Neg Hx   . Ovarian cancer Neg Hx   . Cervical cancer Neg Hx     Social History Social History   Tobacco Use  . Smoking status: Former Smoker    Packs/day: 1.00    Years: 15.00    Pack years: 15.00  Types: Cigarettes    Quit date: 2009    Years since quitting: 12.8  . Smokeless tobacco: Never Used  Vaping Use  . Vaping Use: Never used  Substance Use Topics  . Alcohol use: Yes    Comment: socially, not even monthly  . Drug use: No     Allergies   Erythromycin and Penicillins   Review of Systems Review of Systems  Constitutional: Positive for fatigue. Negative for chills and fever.  Gastrointestinal: Positive for nausea. Negative for abdominal pain, diarrhea and vomiting.  Genitourinary: Positive for dysuria, frequency and urgency. Negative for decreased urine volume, flank pain, hematuria, pelvic pain, vaginal bleeding, vaginal discharge and vaginal pain.  Musculoskeletal: Positive for back pain.  Skin: Negative for rash.     Physical Exam Triage Vital Signs ED Triage Vitals  Enc Vitals Group     BP 12/30/19 1931 (!) 162/92     Pulse Rate 12/30/19 1931 (!) 109     Resp 12/30/19 1931 16     Temp 12/30/19 1931 99.4 F (37.4 C)     Temp Source 12/30/19 1931 Oral     SpO2 12/30/19 1931 96 %     Weight 12/30/19 1929 285 lb (129.3 kg)     Height 12/30/19 1929 5\' 10"  (1.778 m)     Head Circumference --      Peak Flow --      Pain Score 12/30/19 1929 6     Pain Loc --      Pain Edu? --      Excl. in [place]? --    No data found.  Updated Vital  Signs BP (!) 162/92 (BP Location: Left Arm)   Pulse (!) 109   Temp 99.4 F (37.4 C) (Oral)   Resp 16   Ht 5\' 10"  (1.778 m)   Wt 285 lb (129.3 kg)   SpO2 96%   BMI 40.89 kg/m      Physical Exam Vitals and nursing note reviewed.  Constitutional:      General: She is not in acute distress.    Appearance: Normal appearance. She is obese. She is not ill-appearing or toxic-appearing.  HENT:     Head: Normocephalic and atraumatic.  Eyes:     General: No scleral icterus.       Right eye: No discharge.        Left eye: No discharge.     Conjunctiva/sclera: Conjunctivae normal.  Cardiovascular:     Rate and Rhythm: Normal rate and regular rhythm.     Heart sounds: Normal heart sounds.  Pulmonary:     Effort: Pulmonary effort is normal. No respiratory distress.     Breath sounds: Normal breath sounds.  Abdominal:     Palpations: Abdomen is soft.     Tenderness: There is abdominal tenderness (suprapubic). There is right CVA tenderness (mild).  Musculoskeletal:     Cervical back: Neck supple.  Skin:    General: Skin is dry.  Neurological:     General: No focal deficit present.     Mental Status: She is alert. Mental status is at baseline.     Motor: No weakness.     Gait: Gait normal.  Psychiatric:        Mood and Affect: Mood normal.        Behavior: Behavior normal.        Thought Content: Thought content normal.      UC Treatments / Results  Labs (all labs ordered are listed,  but only abnormal results are displayed) Labs Reviewed  URINALYSIS, COMPLETE (UACMP) WITH MICROSCOPIC - Abnormal; Notable for the following components:      Result Value   APPearance HAZY (*)    Specific Gravity, Urine <1.005 (*)    Glucose, UA 500 (*)    Hgb urine dipstick TRACE (*)    Bacteria, UA FEW (*)    All other components within normal limits  GLUCOSE, CAPILLARY - Abnormal; Notable for the following components:   Glucose-Capillary 262 (*)    All other components within normal  limits  URINE CULTURE    EKG   Radiology No results found.  Procedures Procedures (including critical care time)  Medications Ordered in UC Medications - No data to display  Initial Impression / Assessment and Plan / UC Course  I have reviewed the triage vital signs and the nursing notes.  Pertinent labs & imaging results that were available during my care of the patient were reviewed by me and considered in my medical decision making (see chart for details).   Urinalysis performed today shows trace blood, large amount of glucose, and low specific gravity. Patient does have a history of type 2 diabetes fingerstick glucose check was 262. She admits that she just recently ate a lot of sweets before coming into the urgent care. Advised patient that with the trace blood in the urine and right-sided back pain she may have another kidney stone causing her pain. I have sent tamsulosin and Zofran for her. Also sent short supply of hydrocodone for pain. Controlled substance database reviewed and patient low risk for abuse. Since she says she has some dysuria and increased urinary frequency with this, also treating to cover for possible urinary tract infection. Urine sent for culture. Also sent Diflucan to pharmacy since she is at increased risk for yeast infection due to multiple antibiotic use and diabetes. Advised patient to follow-up with PCP for recheck. Advised her to return if she develops any new or worsening symptoms especially if she gets a fever, increased pain or weakness. Patient is agreeable and understanding.   Final Clinical Impressions(s) / UC Diagnoses   Final diagnoses:  Right nephrolithiasis  Acute cystitis with hematuria  Urinary frequency  Non-intractable vomiting with nausea, unspecified vomiting type   Discharge Instructions   None    ED Prescriptions    Medication Sig Dispense Auth. Provider   sulfamethoxazole-trimethoprim (BACTRIM DS) 800-160 MG tablet Take 1  tablet by mouth 2 (two) times daily for 7 days. 14 tablet Laurene Footman B, PA-C   tamsulosin (FLOMAX) 0.4 MG CAPS capsule Take 1 capsule (0.4 mg total) by mouth daily after breakfast. 30 capsule Laurene Footman B, PA-C   ondansetron (ZOFRAN ODT) 4 MG disintegrating tablet Take 1 tablet (4 mg total) by mouth every 8 (eight) hours as needed for up to 7 days for nausea or vomiting. 20 tablet Danton Clap, PA-C   HYDROcodone-acetaminophen (NORCO/VICODIN) 5-325 MG tablet Take 1 tablet by mouth every 4 (four) hours as needed for up to 5 days. 12 tablet Laurene Footman B, PA-C   fluconazole (DIFLUCAN) 150 MG tablet Take 1 tab PO q72 h for yeast infection 3 tablet Danton Clap, PA-C     I have reviewed the PDMP during this encounter.   Danton Clap, PA-C 12/31/19 1015

## 2019-12-30 NOTE — ED Triage Notes (Signed)
Patient c/o lower back pain off and on since earlier this week.  Patient reports urinary urgency that started on Wed.  Patient has noticed some blood in her urine.  Patient is currently on an antibiotic for tooth abscess.

## 2020-01-02 LAB — URINE CULTURE: Culture: >=100000 — AB

## 2020-01-03 ENCOUNTER — Inpatient Hospital Stay: Admission: RE | Admit: 2020-01-03 | Payer: BC Managed Care – PPO | Source: Ambulatory Visit

## 2020-01-11 ENCOUNTER — Other Ambulatory Visit: Payer: Self-pay | Admitting: Family Medicine

## 2020-01-11 ENCOUNTER — Encounter: Payer: Self-pay | Admitting: Family Medicine

## 2020-01-13 ENCOUNTER — Other Ambulatory Visit: Payer: Self-pay

## 2020-01-13 ENCOUNTER — Ambulatory Visit
Admission: RE | Admit: 2020-01-13 | Discharge: 2020-01-13 | Disposition: A | Discharge status: Home / Self Care | Payer: BC Managed Care – PPO | Source: Ambulatory Visit | Attending: Family Medicine | Admitting: Family Medicine

## 2020-01-13 DIAGNOSIS — Z1231 Encounter for screening mammogram for malignant neoplasm of breast: Secondary | ICD-10-CM | POA: Diagnosis present

## 2020-02-10 ENCOUNTER — Telehealth: Payer: Self-pay | Admitting: Family Medicine

## 2020-02-10 DIAGNOSIS — E1169 Type 2 diabetes mellitus with other specified complication: Secondary | ICD-10-CM

## 2020-02-10 NOTE — Telephone Encounter (Signed)
Patient is calling to ask if Dr. B could write on the script that the Semaglutide, 1 MG/DOSE, (OZEMPIC, 1 MG/DOSE,) 2 MG/1.5ML SOPN [[id number]] is needed for the diabetis because right now the insurance will not pay for the medication until March 03, 2020.  Please advise [phone] [facility] [place].

## 2020-02-13 MED ORDER — OZEMPIC (1 MG/DOSE) 2 MG/1.5ML ~~LOC~~ SOPN: 2.0000 mg | PEN_INJECTOR | SUBCUTANEOUS | 3 refills | Status: DC

## 2020-02-13 NOTE — Telephone Encounter (Signed)
Rx changed and associated with diabetes diagnosis

## 2020-03-01 ENCOUNTER — Ambulatory Visit: Admit: 2020-03-01 | Discharge status: Absent without leave | Payer: Self-pay

## 2020-03-01 ENCOUNTER — Ambulatory Visit
Admission: EM | Admit: 2020-03-01 | Discharge: 2020-03-01 | Disposition: A | Discharge status: Home / Self Care | Payer: BC Managed Care – PPO | Attending: Physician Assistant | Admitting: Physician Assistant

## 2020-03-01 ENCOUNTER — Other Ambulatory Visit: Payer: Self-pay

## 2020-03-01 DIAGNOSIS — U071 COVID-19: Secondary | ICD-10-CM

## 2020-03-01 DIAGNOSIS — R112 Nausea with vomiting, unspecified: Secondary | ICD-10-CM

## 2020-03-01 DIAGNOSIS — R059 Cough, unspecified: Secondary | ICD-10-CM | POA: Diagnosis present

## 2020-03-01 LAB — RESP PANEL BY RT-PCR (FLU A&B, COVID) ARPGX2
Influenza A by PCR: NEGATIVE
Influenza B by PCR: NEGATIVE
SARS Coronavirus 2 by RT PCR: POSITIVE — AB

## 2020-03-01 MED ORDER — HYDROCOD POLST-CPM POLST ER 10-8 MG/5ML PO SUER
5.0000 mL | Freq: Two times a day (BID) | ORAL | 0 refills | Status: DC | PRN
Start: 2020-03-01 — End: 2020-04-30

## 2020-03-01 MED ORDER — ONDANSETRON HCL 4 MG PO TABS: 4.0000 mg | ORAL_TABLET | Freq: Three times a day (TID) | ORAL | 0 refills | Status: AC | PRN

## 2020-03-01 NOTE — Discharge Instructions (Signed)
+COVID   If symptomatic, go home and rest. Push fluids. Take Tylenol as needed for discomfort. Gargle warm salt water. Throat lozenges. Take Mucinex DM or Robitussin for cough. Humidifier in bedroom to ease coughing. Warm showers. Also review the COVID handout for more information.  COVID-19 INFECTION: The incubation period of COVID-19 is approximately 14 days after exposure, with most symptoms developing in roughly 4-5 days. Symptoms may range in severity from mild to critically severe. Roughly 80% of those infected will have mild symptoms. People of any age may become infected with COVID-19 and have the ability to transmit the virus. The most common symptoms include: fever, fatigue, cough, body aches, headaches, sore throat, nasal congestion, shortness of breath, nausea, vomiting, diarrhea, changes in smell and/or taste.    COURSE OF ILLNESS Some patients may begin with mild disease which can progress quickly into critical symptoms. If your symptoms are worsening please call ahead to the Emergency Department and proceed there for further treatment. Recovery time appears to be roughly 1-2 weeks for mild symptoms and 3-6 weeks for severe disease.   GO IMMEDIATELY TO ER FOR FEVER YOU ARE UNABLE TO GET DOWN WITH TYLENOL, BREATHING PROBLEMS, CHEST PAIN, FATIGUE, LETHARGY, INABILITY TO EAT OR DRINK, ETC  QUARANTINE AND ISOLATION: To help decrease the spread of COVID-19 please remain isolated if you have COVID infection or are highly suspected to have COVID infection. This means -stay home and isolate to one room in the home if you live with others. Do not share a bed or bathroom with others while ill, sanitize and wipe down all countertops and keep common areas clean and disinfected. You may discontinue isolation if you have a mild case and are asymptomatic 10 days after symptom onset as long as you have been fever free >24 hours without having to take Motrin or Tylenol. If your case is more severe (meaning  you develop pneumonia or are admitted in the hospital), you may have to isolate longer.   If you have been in close contact (within 6 feet) of someone diagnosed with COVID 19, you are advised to quarantine in your home for 14 days as symptoms can develop anywhere from 2-14 days after exposure to the virus. If you develop symptoms, you  must isolate.  Most current guidelines for COVID after exposure -isolate 10 days if you ARE NOT tested for COVID as long as symptoms do not develop -isolate 7 days if you are tested and remain asymptomatic -You do not necessarily need to be tested for COVID if you have + exposure and        develop   symptoms. Just isolate at home x10 days from symptom onset During this global pandemic, CDC advises to practice social distancing, try to stay at least 67ft away from others at all times. Wear a face covering. Wash and sanitize your hands regularly and avoid going anywhere that is not necessary.  KEEP IN MIND THAT THE COVID TEST IS NOT 100% ACCURATE AND YOU SHOULD STILL DO EVERYTHING TO PREVENT POTENTIAL SPREAD OF VIRUS TO OTHERS (WEAR MASK, WEAR GLOVES, WASH HANDS AND SANITIZE REGULARLY). IF INITIAL TEST IS NEGATIVE, THIS MAY NOT MEAN YOU ARE DEFINITELY NEGATIVE. MOST ACCURATE TESTING IS DONE 5-7 DAYS AFTER EXPOSURE.   It is not advised by CDC to get re-tested after receiving a positive COVID test since you can still test positive for weeks to months after you have already cleared the virus.   *If you have not been vaccinated for  I strongly suggest you consider getting vaccinated as long as there are no contraindications.   

## 2020-03-01 NOTE — ED Provider Notes (Signed)
[facility]    CSN: [id number] Arrival date & time: 03/01/20  1828      History   Chief Complaint Chief Complaint  Patient presents with  . Cough  . Sore Throat    HPI Bonnie Garcia is a 54 y.o. female presenting for 2-day history of fevers up to 99.7, headaches, cough, sore throat, diarrhea, vomiting, and chills.  Patient also wants to reduce sense of taste.  Patient states that she was exposed to influenza last week.  She has been taking over-the-counter Robitussin, Mucinex, Tylenol and ibuprofen for symptoms.  Her husband is ill with similar symptoms.  Patient says that she has been fully vaccinated for COVID-19 and influenza.  She says her husband's not been vaccinated.  Patient states her cough is really bad and she cannot sleep.  She has used in the coughing fits.  Denies any breathing trouble or abdominal pain.  No other concerns.  Past medical history is significant for diabetes and hypertension.  HPI  Past Medical History:  Diagnosis Date  . ADHD (attention deficit hyperactivity disorder)   . Anxiety   . Depression   . Diabetes mellitus without complication ([place])   . Hypertension   . Kidney stones   . Thyroid nodule     Patient Active Problem List   Diagnosis Date Noted  . Acute bilateral low back pain without sciatica 06/23/2019  . Myalgia 12/17/2017  . Thyroid nodule 10/21/2017  . Hyperlipidemia associated with type 2 diabetes mellitus ([place]) 10/21/2017  . PCOS (polycystic ovarian syndrome) 10/21/2017  . Hirsutism 10/21/2017  . Dysuria 10/21/2017  . Severe recurrent major depression without psychotic features ([place]) 09/11/2017  . ADHD (attention deficit hyperactivity disorder) 09/11/2017  . Diabetes mellitus ([place]) 09/11/2017  . Hypertension 09/11/2017  . B12 deficiency 10/13/2013  . Morbid obesity ([place]) 10/13/2013  . Ureteric stone 06/08/2013  . Calculus of kidney 01/21/2013    Past Surgical History:  Procedure Laterality Date  .  APPENDECTOMY    . CHOLECYSTECTOMY    . COLONOSCOPY WITH PROPOFOL N/A 07/23/2015   Procedure: COLONOSCOPY WITH PROPOFOL;  Surgeon: Lollie Sails, MD;  Location: [facility] ENDOSCOPY;  Service: Endoscopy;  Laterality: N/A;  . ECTOPIC PREGNANCY SURGERY    . ENDOMETRIAL ABLATION    . ESOPHAGOGASTRODUODENOSCOPY (EGD) WITH PROPOFOL N/A 07/23/2015   Procedure: ESOPHAGOGASTRODUODENOSCOPY (EGD) WITH PROPOFOL;  Surgeon: Lollie Sails, MD;  Location: [facility] ENDOSCOPY;  Service: Endoscopy;  Laterality: N/A;  . TUBAL LIGATION      OB History   No obstetric history on file.      Home Medications    Prior to Admission medications   Medication Sig Start Date End Date Taking? Authorizing Provider  chlorpheniramine-HYDROcodone (TUSSIONEX PENNKINETIC ER) 10-8 MG/5ML SUER Take 5 mLs by mouth every 12 (twelve) hours as needed for cough. 03/01/20 06/09/20 Yes Laurene Footman B, PA-C  ondansetron (ZOFRAN) 4 MG tablet Take 1 tablet (4 mg total) by mouth every 8 (eight) hours as needed for up to 7 days for nausea or vomiting. 03/01/20 03/08/20 Yes Danton Clap, PA-C  ADDERALL XR 25 MG 24 hr capsule  09/16/18   [provider]  aspirin EC 81 MG tablet Take 81 mg by mouth daily.    [provider]  citalopram (CELEXA) 40 MG tablet Take 1 tablet (40 mg total) by mouth daily. 12/12/19   Bacigalupo, Dionne Bucy, MD  cloNIDine (CATAPRES) 0.1 MG tablet Take 0.1 mg by mouth as needed.  11/08/18   [provider]  Dapagliflozin-metFORMIN HCl ER (XIGDUO XR) 07-998 MG TB24 Take 1 tablet by mouth 2 (two) times daily. 12/12/19   Virginia Crews, MD  fluconazole (DIFLUCAN) 150 MG tablet Take 1 tab PO q72 h for yeast infection 12/30/19   Laurene Footman B, PA-C  gabapentin (NEURONTIN) 300 MG capsule Take 300 mg by mouth 3 (three) times daily.  11/04/18   [provider]  glucose blood test strip Use as instructed to test blood sugar 2-3 times daily 06/23/18   Virginia Crews, MD  Insulin Pen  Needle (B-D UF III MINI PEN NEEDLES) 31G X 5 MM MISC Use as directed to inject ozempic weekly 12/02/18   Virginia Crews, MD  LORazepam (ATIVAN) 0.5 MG tablet TK 1 T PO  BID 11/12/17   [provider]  LORazepam (ATIVAN) 1 MG tablet TK 1 T PO BID UTD FOR PANIC SYMPTOMS 10/19/18   [provider]  meloxicam (MOBIC) 15 MG tablet Take 1 tablet (15 mg total) by mouth daily. 12/12/19   Virginia Crews, MD  rosuvastatin (CRESTOR) 5 MG tablet Take 1 tablet (5 mg total) by mouth daily. 12/12/19   Bacigalupo, Dionne Bucy, MD  Semaglutide, 1 MG/DOSE, (OZEMPIC, 1 MG/DOSE,) 2 MG/1.5ML SOPN Inject 2 mg into the skin once a week. 02/13/20   Bacigalupo, Dionne Bucy, MD  SPRAVATO, 56 MG DOSE, 28 MG/DEVICE SOPK 1 Dose 2 (two) times a week. 10/13/17   [provider]  traZODone (DESYREL) 150 MG tablet  11/09/18   [provider]  Vitamin D, Ergocalciferol, (DRISDOL) 1.25 MG (50000 UNIT) CAPS capsule Take 1 capsule (50,000 Units total) by mouth every 7 (seven) days. 12/12/19   Virginia Crews, MD    Family History Family History  Problem Relation Age of Onset  . Breast cancer Paternal Aunt   . Pancreatic cancer Paternal Aunt   . Thyroid disease Paternal Aunt   . Breast cancer Paternal Grandmother   . Diabetes Paternal Grandmother   . Hypertension Paternal Grandmother   . Diabetes Mother   . Glaucoma Mother   . Diabetes Father   . Hypertension Father   . Heart disease Father   . Parkinson's disease Father   . Kidney disease Father   . Skin cancer Father   . Depression Father   . Stroke Father   . Seizures Father   . GER disease Father   . Sleep apnea Father   . Bell's palsy Father   . Anxiety disorder Sister   . Hypertension Sister   . Arthritis Sister   . Anxiety disorder Daughter   . Depression Daughter   . Depression Son   . Anxiety disorder Son   . Thyroid cancer Maternal Uncle   . Suicidality Maternal Uncle   . Cataracts Maternal Grandmother   .  Heart attack Maternal Grandfather 42  . Throat cancer Paternal Grandfather   . Colon cancer Neg Hx   . Ovarian cancer Neg Hx   . Cervical cancer Neg Hx     Social History Social History   Tobacco Use  . Smoking status: Former Smoker    Packs/day: 1.00    Years: 15.00    Pack years: 15.00    Types: Cigarettes    Quit date: 2009    Years since quitting: 13.0  . Smokeless tobacco: Never Used  Vaping Use  . Vaping Use: Never used  Substance Use Topics  . Alcohol use: Yes    Comment: socially, not  even monthly  . Drug use: No     Allergies   Erythromycin and Penicillins   Review of Systems Review of Systems  Constitutional: Positive for fatigue and fever. Negative for chills and diaphoresis.  HENT: Positive for congestion, rhinorrhea and sore throat. Negative for ear pain, sinus pressure and sinus pain.   Respiratory: Positive for cough. Negative for shortness of breath.   Cardiovascular: Negative for chest pain.  Gastrointestinal: Positive for diarrhea, nausea and vomiting. Negative for abdominal pain.  Musculoskeletal: Positive for myalgias. Negative for arthralgias.  Skin: Negative for rash.  Neurological: Positive for headaches. Negative for weakness.  Hematological: Negative for adenopathy.     Physical Exam Triage Vital Signs ED Triage Vitals  Enc Vitals Group     BP 03/01/20 1944 (S) (!) 132/103     Pulse Rate 03/01/20 1944 95     Resp 03/01/20 1944 18     Temp 03/01/20 1944 99.3 F (37.4 C)     Temp Source 03/01/20 1944 Oral     SpO2 03/01/20 1944 97 %     Weight 03/01/20 1934 290 lb (131.5 kg)     Height --      Head Circumference --      Peak Flow --      Pain Score 03/01/20 1934 0     Pain Loc --      Pain Edu? --      Excl. in [place]? --    No data found.  Updated Vital Signs BP (S) (!) 132/103 (BP Location: Right Arm)   Pulse 95   Temp 99.3 F (37.4 C) (Oral)   Resp 18   Wt 290 lb (131.5 kg)   SpO2 97%   BMI 41.61 kg/m        Physical Exam Vitals and nursing note reviewed.  Constitutional:      General: She is not in acute distress.    Appearance: Normal appearance. She is well-developed. She is obese. She is not ill-appearing or toxic-appearing.  HENT:     Head: Normocephalic and atraumatic.     Right Ear: Tympanic membrane, ear canal and external ear normal.     Left Ear: Tympanic membrane, ear canal and external ear normal.     Nose: Congestion and rhinorrhea present.     Mouth/Throat:     Mouth: Mucous membranes are moist.     Pharynx: Oropharynx is clear. Posterior oropharyngeal erythema present.  Eyes:     General: No scleral icterus.       Right eye: No discharge.        Left eye: No discharge.     Conjunctiva/sclera: Conjunctivae normal.  Cardiovascular:     Rate and Rhythm: Normal rate and regular rhythm.     Heart sounds: Normal heart sounds.  Pulmonary:     Effort: Pulmonary effort is normal. No respiratory distress.     Breath sounds: Normal breath sounds.  Abdominal:     Palpations: Abdomen is soft.     Tenderness: There is no abdominal tenderness.  Musculoskeletal:     Cervical back: Neck supple.  Skin:    General: Skin is dry.  Neurological:     General: No focal deficit present.     Mental Status: She is alert. Mental status is at baseline.     Motor: No weakness.     Gait: Gait normal.  Psychiatric:        Mood and Affect: Mood normal.  Behavior: Behavior normal.        Thought Content: Thought content normal.      UC Treatments / Results  Labs (all labs ordered are listed, but only abnormal results are displayed) Labs Reviewed  RESP PANEL BY RT-PCR (FLU A&B, COVID) ARPGX2 - Abnormal; Notable for the following components:      Result Value   SARS Coronavirus 2 by RT PCR POSITIVE (*)    All other components within normal limits    EKG   Radiology No results found.  Procedures Procedures (including critical care time)  Medications Ordered in  UC Medications - No data to display  Initial Impression / Assessment and Plan / UC Course  I have reviewed the triage vital signs and the nursing notes.  Pertinent labs & imaging results that were available during my care of the patient were reviewed by me and considered in my medical decision making (see chart for details).   54 year old female with history of diabetes and hypertension presenting for 2-day history of flulike/Covid symptoms. She has been fully vaccinated for COVID-19. Blood pressure slightly elevated at 132/103 and all other vital signs are normal stable. She is in no acute distress and does not appear ill. On exam, she has minor nasal congestion and rhinorrhea with posterior pharyngeal erythema. Her chest is clear to auscultation and heart regular rate and rhythm.  Respiratory panel obtained and patient positive for COVID-19. CDC guidelines, isolation protocol, and ED precautions reviewed with patient. Advised supportive care with increasing rest and fluids. I did prescribe Tussionex for her since she says the over-the-counter cough syrups are not really helping. Controlled substance database reviewed and patient low risk for abuse. Also sent Zofran for her nausea and vomiting. Advised patient that I have reached out to the infusion center for consideration of MAB therapy since she would be a good candidate, however the list is lengthy and patients are prioritized. Patient aware. Again, reviewed ED precautions with patient.   Final Clinical Impressions(s) / UC Diagnoses   Final diagnoses:  COVID-19  Cough  Nausea and vomiting, intractability of vomiting not specified, unspecified vomiting type     Discharge Instructions     +COVID   If symptomatic, go home and rest. Push fluids. Take Tylenol as needed for discomfort. Gargle warm salt water. Throat lozenges. Take Mucinex DM or Robitussin for cough. Humidifier in bedroom to ease coughing. Warm showers. Also review the COVID  handout for more information.  COVID-19 INFECTION: The incubation period of COVID-19 is approximately 14 days after exposure, with most symptoms developing in roughly 4-5 days. Symptoms may range in severity from mild to critically severe. Roughly 80% of those infected will have mild symptoms. People of any age may become infected with COVID-19 and have the ability to transmit the virus. The most common symptoms include: fever, fatigue, cough, body aches, headaches, sore throat, nasal congestion, shortness of breath, nausea, vomiting, diarrhea, changes in smell and/or taste.    COURSE OF ILLNESS Some patients may begin with mild disease which can progress quickly into critical symptoms. If your symptoms are worsening please call ahead to the Emergency Department and proceed there for further treatment. Recovery time appears to be roughly 1-2 weeks for mild symptoms and 3-6 weeks for severe disease.   GO IMMEDIATELY TO ER FOR FEVER YOU ARE UNABLE TO GET DOWN WITH TYLENOL, BREATHING PROBLEMS, CHEST PAIN, FATIGUE, LETHARGY, INABILITY TO EAT OR DRINK, ETC  QUARANTINE AND ISOLATION: To help decrease the spread of  COVID-19 please remain isolated if you have COVID infection or are highly suspected to have COVID infection. This means -stay home and isolate to one room in the home if you live with others. Do not share a bed or bathroom with others while ill, sanitize and wipe down all countertops and keep common areas clean and disinfected. You may discontinue isolation if you have a mild case and are asymptomatic 10 days after symptom onset as long as you have been fever free >24 hours without having to take Motrin or Tylenol. If your case is more severe (meaning you develop pneumonia or are admitted in the hospital), you may have to isolate longer.   If you have been in close contact (within 6 feet) of someone diagnosed with COVID 19, you are advised to quarantine in your home for 14 days as symptoms can develop  anywhere from 2-14 days after exposure to the virus. If you develop symptoms, you  must isolate.  Most current guidelines for COVID after exposure -isolate 10 days if you ARE NOT tested for COVID as long as symptoms do not develop -isolate 7 days if you are tested and remain asymptomatic -You do not necessarily need to be tested for COVID if you have + exposure and        develop   symptoms. Just isolate at home x10 days from symptom onset During this global pandemic, CDC advises to practice social distancing, try to stay at least 15ft away from others at all times. Wear a face covering. Wash and sanitize your hands regularly and avoid going anywhere that is not necessary.  KEEP IN MIND THAT THE COVID TEST IS NOT 100% ACCURATE AND YOU SHOULD STILL DO EVERYTHING TO PREVENT POTENTIAL SPREAD OF VIRUS TO OTHERS (WEAR MASK, WEAR GLOVES, Sisters HANDS AND SANITIZE REGULARLY). IF INITIAL TEST IS NEGATIVE, THIS MAY NOT MEAN YOU ARE DEFINITELY NEGATIVE. MOST ACCURATE TESTING IS DONE 5-7 DAYS AFTER EXPOSURE.   It is not advised by CDC to get re-tested after receiving a positive COVID test since you can still test positive for weeks to months after you have already cleared the virus.   *If you have not been vaccinated for COVID, I strongly suggest you consider getting vaccinated as long as there are no contraindications.      ED Prescriptions    Medication Sig Dispense Auth. Provider   chlorpheniramine-HYDROcodone (TUSSIONEX PENNKINETIC ER) 10-8 MG/5ML SUER Take 5 mLs by mouth every 12 (twelve) hours as needed for cough. 10 mL Laurene Footman B, PA-C   ondansetron (ZOFRAN) 4 MG tablet Take 1 tablet (4 mg total) by mouth every 8 (eight) hours as needed for up to 7 days for nausea or vomiting. 15 tablet Gretta Cool     PDMP not reviewed this encounter.   Danton Clap, PA-C 03/03/20 1254

## 2020-03-01 NOTE — ED Triage Notes (Signed)
Patient presents to Urgent Care with complaints of cough, sore throat, vomiting, fever last temp 99.7, headache, diarrhea, chills  Since Tuesday. Patient reports treating symptoms with Robitussin, mucinex, tylenol, and ibuprofen. Pt reports exposed to flu last week.

## 2020-03-26 ENCOUNTER — Other Ambulatory Visit: Payer: Self-pay

## 2020-03-26 NOTE — Telephone Encounter (Signed)
Patient requesting refills on medication. She had to r/s her appt this week and will be out of meds. Ok to refill? [facility] in [place]. Thanks!

## 2020-03-27 ENCOUNTER — Encounter: Payer: BC Managed Care – PPO | Admitting: Family Medicine

## 2020-03-27 MED ORDER — XIGDUO XR 5-1000 MG PO TB24: 1.0000 | ORAL_TABLET | Freq: Two times a day (BID) | ORAL | 5 refills | Status: DC

## 2020-04-30 ENCOUNTER — Ambulatory Visit (INDEPENDENT_AMBULATORY_CARE_PROVIDER_SITE_OTHER): Payer: BC Managed Care – PPO | Admitting: Family Medicine

## 2020-04-30 ENCOUNTER — Encounter: Payer: Self-pay | Admitting: Family Medicine

## 2020-04-30 ENCOUNTER — Other Ambulatory Visit: Payer: Self-pay

## 2020-04-30 VITALS — BP 132/89 | HR 80 | Temp 99.1°F | Resp 16 | Ht 71.0 in | Wt 276.4 lb

## 2020-04-30 DIAGNOSIS — E538 Deficiency of other specified B group vitamins: Secondary | ICD-10-CM | POA: Diagnosis not present

## 2020-04-30 DIAGNOSIS — Z0000 Encounter for general adult medical examination without abnormal findings: Secondary | ICD-10-CM

## 2020-04-30 DIAGNOSIS — E1169 Type 2 diabetes mellitus with other specified complication: Secondary | ICD-10-CM

## 2020-04-30 DIAGNOSIS — E785 Hyperlipidemia, unspecified: Secondary | ICD-10-CM

## 2020-04-30 DIAGNOSIS — E559 Vitamin D deficiency, unspecified: Secondary | ICD-10-CM

## 2020-04-30 DIAGNOSIS — E282 Polycystic ovarian syndrome: Secondary | ICD-10-CM

## 2020-04-30 DIAGNOSIS — Z1159 Encounter for screening for other viral diseases: Secondary | ICD-10-CM

## 2020-04-30 DIAGNOSIS — F332 Major depressive disorder, recurrent severe without psychotic features: Secondary | ICD-10-CM

## 2020-04-30 DIAGNOSIS — I152 Hypertension secondary to endocrine disorders: Secondary | ICD-10-CM

## 2020-04-30 DIAGNOSIS — E1159 Type 2 diabetes mellitus with other circulatory complications: Secondary | ICD-10-CM

## 2020-04-30 DIAGNOSIS — D582 Other hemoglobinopathies: Secondary | ICD-10-CM

## 2020-04-30 DIAGNOSIS — Z794 Long term (current) use of insulin: Secondary | ICD-10-CM

## 2020-04-30 DIAGNOSIS — I10 Essential (primary) hypertension: Secondary | ICD-10-CM

## 2020-04-30 DIAGNOSIS — M545 Low back pain, unspecified: Secondary | ICD-10-CM

## 2020-04-30 LAB — POCT GLYCOSYLATED HEMOGLOBIN (HGB A1C): Hemoglobin A1C: 8.6 % — AB (ref 4.0–5.6)

## 2020-04-30 MED ORDER — ROSUVASTATIN CALCIUM 5 MG PO TABS: 5.0000 mg | ORAL_TABLET | Freq: Every day | ORAL | 1 refills | Status: DC

## 2020-04-30 MED ORDER — MELOXICAM 15 MG PO TABS: 15.0000 mg | ORAL_TABLET | Freq: Every day | ORAL | 5 refills | Status: DC

## 2020-04-30 MED ORDER — VITAMIN D (ERGOCALCIFEROL) 1.25 MG (50000 UNIT) PO CAPS
50000.0000 [IU] | ORAL_CAPSULE | ORAL | 3 refills | Status: DC
Start: 2020-04-30 — End: 2022-10-01

## 2020-04-30 MED ORDER — CITALOPRAM HYDROBROMIDE 40 MG PO TABS
40.0000 mg | ORAL_TABLET | Freq: Every day | ORAL | 1 refills | Status: DC
Start: 2020-04-30 — End: 2021-02-11

## 2020-04-30 MED ORDER — OZEMPIC (1 MG/DOSE) 2 MG/1.5ML ~~LOC~~ SOPN: 2.0000 mg | PEN_INJECTOR | SUBCUTANEOUS | 3 refills | Status: DC

## 2020-04-30 NOTE — Progress Notes (Signed)
Complete physical exam   Patient: Bonnie Garcia   DOB: [birth year]   55 y.o. Female  MRN: [id number] Visit Date: 04/30/2020  Today's healthcare provider: Lavon Paganini, MD   Chief Complaint  Patient presents with  . Annual Exam  . Diabetes   Subjective    Bonnie Garcia is a 55 y.o. female who presents today for a complete physical exam.  She reports consuming a general diet. The patient does not participate in regular exercise at present. She generally feels fairly well. She reports sleeping fairly well. She does not have additional problems to discuss today.   HPI  11/18/2017 Pap-negative 01/13/2020 Mammogram-BI-RADS 1 07/23/2015 Colonoscopy-Diverticulosis  Diabetes Mellitus Type II, Follow-up  Lab Results  Component Value Date   HGBA1C 8.6 (A) 04/30/2020   HGBA1C 7.7 (A) 12/12/2019   HGBA1C 7.6 (H) 06/23/2019   Wt Readings from Last 3 Encounters:  04/30/20 276 lb 6.4 oz (125.4 kg)  03/01/20 290 lb (131.5 kg)  12/30/19 285 lb (129.3 kg)   Last seen for diabetes 4 months ago.  Management since then includes increase Ozempic to 65m weekly. She reports poor compliance with treatment. She is not having side effects.  Symptoms: Yes fatigue No foot ulcerations  Yes appetite changes No nausea  No paresthesia of the feet  Yes polydipsia  No polyuria No visual disturbances   No vomiting     Home blood sugar records: fasting range: not being checked  Episodes of hypoglycemia? No    Current insulin regiment: none Most Recent Eye Exam:  Current exercise: none Current diet habits: in general, an "unhealthy" diet  Pertinent Labs: Lab Results  Component Value Date   CHOL 183 04/30/2020   HDL 45 04/30/2020   LDLCALC 96 04/30/2020   TRIG 251 (H) 04/30/2020   CHOLHDL 3.7 03/25/2019   Lab Results  Component Value Date   NA 144 04/30/2020   K 4.5 04/30/2020   CREATININE 0.68 04/30/2020   GFRNONAA 97 06/23/2019   GFRAA 111 06/23/2019    GLUCOSE 214 (H) 04/30/2020     ---------------------------------------------------------------------------------------------------   Past Medical History:  Diagnosis Date  . ADHD (attention deficit hyperactivity disorder)   . Anxiety   . Depression   . Diabetes mellitus [place] complication [place]   . Hypertension   . Kidney stones   . Thyroid nodule    Past Surgical History:  Procedure Laterality Date  . APPENDECTOMY    . CHOLECYSTECTOMY    . COLONOSCOPY WITH PROPOFOL N/A 07/23/2015   Procedure: COLONOSCOPY WITH PROPOFOL;  Surgeon: MLollie Sails MD;  [facility];  Service: Endoscopy;  Laterality: N/A;  . ECTOPIC PREGNANCY SURGERY    . ENDOMETRIAL ABLATION    . ESOPHAGOGASTRODUODENOSCOPY (EGD) WITH PROPOFOL N/A 07/23/2015   Procedure: ESOPHAGOGASTRODUODENOSCOPY (EGD) WITH PROPOFOL;  Surgeon: MLollie Sails MD;  [facility];  Service: Endoscopy;  Laterality: N/A;  . TUBAL LIGATION     Social History   Socioeconomic History  . Marital status: Married    Spouse name: Bonnie Garcia . Number of children: 2  . Years of education: 137 . Highest education level: High school graduate  Occupational History  . Occupation: tControl and instrumentation engineer   Employer: [employer]  Tobacco Use  . Smoking status: Former Smoker    Packs/day: 1.00    Years: 15.00    Pack years: 15.00    Types: Cigarettes    Quit date: 2009    Years since  quitting: 13.1  . Smokeless tobacco: Never Used  Vaping Use  . Vaping Use: Never used  Substance and Sexual Activity  . Alcohol use: Yes    Comment: socially, not even monthly  . Drug use: No  . Sexual activity: Yes    Partners: Male    Birth control/protection: Surgical  Other Topics Concern  . Not on file  Social History Narrative   Pt has 2 biological children, and she adopted her nephew (who committed suicide at age 79).   Social Determinants of Health   Financial Resource Strain: Not on file  Food  Insecurity: Not on file  Transportation Needs: Not on file  Physical Activity: Not on file  Stress: Not on file  Social Connections: Not on file  Intimate Partner Violence: Not on file   Family Status  Relation Name Status  . Ethlyn Daniels  (Not Specified)  . PGM  (Not Specified)  . Mother  Alive  . Father  Alive  . Sister  Alive  . Daughter  Alive  . Son  Alive  . Mat Uncle  (Not Specified)  . MGM  (Not Specified)  . MGF  (Not Specified)  . PGF  (Not Specified)  . Neg Hx  (Not Specified)   Family History  Problem Relation Age of Onset  . Breast cancer Paternal Aunt   . Pancreatic cancer Paternal Aunt   . Thyroid disease Paternal Aunt   . Breast cancer Paternal Grandmother   . Diabetes Paternal Grandmother   . Hypertension Paternal Grandmother   . Diabetes Mother   . Glaucoma Mother   . Diabetes Father   . Hypertension Father   . Heart disease Father   . Parkinson's disease Father   . Kidney disease Father   . Skin cancer Father   . Depression Father   . Stroke Father   . Seizures Father   . GER disease Father   . Sleep apnea Father   . Bell's palsy Father   . Anxiety disorder Sister   . Hypertension Sister   . Arthritis Sister   . Anxiety disorder Daughter   . Depression Daughter   . Depression Son   . Anxiety disorder Son   . Thyroid cancer Maternal Uncle   . Suicidality Maternal Uncle   . Cataracts Maternal Grandmother   . Heart attack Maternal Grandfather 42  . Throat cancer Paternal Grandfather   . Colon cancer Neg Hx   . Ovarian cancer Neg Hx   . Cervical cancer Neg Hx    Allergies  Allergen Reactions  . Erythromycin Other (See Comments)    Reaction:  GI upset   . Penicillins Hives and Other (See Comments)    Has patient had a PCN reaction causing immediate rash, facial/tongue/throat swelling, SOB or lightheadedness with hypotension: No Has patient had a PCN reaction causing severe rash involving mucus membranes or skin necrosis: No Has patient had  a PCN reaction that required hospitalization No Has patient had a PCN reaction occurring within the last 10 years: No If all of the above answers are "NO", then may proceed with Cephalosporin use.    Patient Care Team: Virginia Crews, MD as PCP - General (Family Medicine)   Medications: Outpatient Medications Prior to Visit  Medication Sig  . amphetamine-dextroamphetamine (ADDERALL XR) 30 MG 24 hr capsule Take 30 mg by mouth daily.  Marland Kitchen aspirin EC 81 MG tablet Take 81 mg by mouth daily.  . cloNIDine (CATAPRES) 0.1 MG tablet  Take 0.1 mg by mouth as needed.   . Dapagliflozin-metFORMIN HCl ER (XIGDUO XR) 07-998 MG TB24 Take 1 tablet by mouth 2 (two) times daily.  Marland Kitchen gabapentin (NEURONTIN) 300 MG capsule Take 300 mg by mouth 3 (three) times daily.   Marland Kitchen glucose blood test strip Use as instructed to test blood sugar 2-3 times daily  . Insulin Pen Needle (B-D UF III MINI PEN NEEDLES) 31G X 5 MM MISC Use as directed to inject ozempic weekly  . LORazepam (ATIVAN) 1 MG tablet TK 1 T PO BID UTD FOR PANIC SYMPTOMS  . SPRAVATO, 56 MG DOSE, 28 MG/DEVICE SOPK 1 Dose 2 (two) times a week.  . traZODone (DESYREL) 150 MG tablet   . [DISCONTINUED] ADDERALL XR 25 MG 24 hr capsule   . [DISCONTINUED] chlorpheniramine-HYDROcodone (TUSSIONEX PENNKINETIC ER) 10-8 MG/5ML SUER Take 5 mLs by mouth every 12 (twelve) hours as needed for cough.  . [DISCONTINUED] citalopram (CELEXA) 40 MG tablet Take 1 tablet (40 mg total) by mouth daily.  . [DISCONTINUED] meloxicam (MOBIC) 15 MG tablet Take 1 tablet (15 mg total) by mouth daily.  . [DISCONTINUED] rosuvastatin (CRESTOR) 5 MG tablet Take 1 tablet (5 mg total) by mouth daily.  . [DISCONTINUED] Vitamin D, Ergocalciferol, (DRISDOL) 1.25 MG (50000 UNIT) CAPS capsule Take 1 capsule (50,000 Units total) by mouth every 7 (seven) days.  . [DISCONTINUED] fluconazole (DIFLUCAN) 150 MG tablet Take 1 tab PO q72 h for yeast infection  . [DISCONTINUED] LORazepam (ATIVAN) 0.5 MG tablet  TK 1 T PO  BID (Patient not taking: Reported on 04/30/2020)  . [DISCONTINUED] Semaglutide, 1 MG/DOSE, (OZEMPIC, 1 MG/DOSE,) 2 MG/1.5ML SOPN Inject 2 mg into the skin once a week. (Patient not taking: Reported on 04/30/2020)   No facility-administered medications prior to visit.    Review of Systems  Constitutional: Positive for activity change, diaphoresis and fatigue.  HENT: Positive for dental problem, hearing loss, sinus pressure, sneezing and sore throat.   Eyes: Positive for itching.  Respiratory: Negative.   Cardiovascular: Negative.   Gastrointestinal: Negative.   Endocrine: Positive for polydipsia.  Genitourinary: Positive for vaginal discharge.  Musculoskeletal: Positive for back pain.  Skin: Negative.   Allergic/Immunologic: Negative.   Neurological: Negative.   Hematological: Negative.   Psychiatric/Behavioral: Positive for decreased concentration and dysphoric mood. The patient is nervous/anxious.     Last CBC Lab Results  Component Value Date   WBC 11.5 (H) 04/30/2020   HGB 16.6 (H) 04/30/2020   HCT 48.2 (H) 04/30/2020   MCV 89 04/30/2020   MCH 30.5 04/30/2020   RDW 12.2 04/30/2020   PLT 393 19/50/9326   Last metabolic panel Lab Results  Component Value Date   GLUCOSE 214 (H) 04/30/2020   NA 144 04/30/2020   K 4.5 04/30/2020   CL 100 04/30/2020   CO2 19 (L) 04/30/2020   BUN 5 (L) 04/30/2020   CREATININE 0.68 04/30/2020   GFRNONAA 97 06/23/2019   GFRAA 111 06/23/2019   CALCIUM 10.2 04/30/2020   PROT 7.4 04/30/2020   ALBUMIN 4.8 04/30/2020   LABGLOB 2.6 04/30/2020   AGRATIO 1.8 04/30/2020   BILITOT 0.2 04/30/2020   ALKPHOS 77 04/30/2020   AST 56 (H) 04/30/2020   ALT 30 04/30/2020   ANIONGAP 16 (H) 09/10/2017   Last lipids Lab Results  Component Value Date   CHOL 183 04/30/2020   HDL 45 04/30/2020   LDLCALC 96 04/30/2020   TRIG 251 (H) 04/30/2020   CHOLHDL 3.7 03/25/2019   Last hemoglobin  A1c Lab Results  Component Value Date   HGBA1C 8.6  (A) 04/30/2020   Last thyroid functions Lab Results  Component Value Date   TSH 0.855 04/30/2020   T3TOTAL 173 08/04/2015   Last vitamin D Lab Results  Component Value Date   VD25OH WILL FOLLOW 04/30/2020   Last vitamin B12 and Folate Lab Results  Component Value Date   VITAMINB12 321 04/30/2020      Objective    BP 132/89 (BP Location: Left Arm, Patient Position: Sitting, Cuff Size: Large)   Pulse 80   Temp 99.1 F (37.3 C) (Oral)   Resp 16   Ht 5' 11" (1.803 m)   Wt 276 lb 6.4 oz (125.4 kg)   SpO2 96%   BMI 38.55 kg/m  BP Readings from Last 3 Encounters:  04/30/20 132/89  03/01/20 (S) (!) 132/103  12/30/19 (!) 162/92   Wt Readings from Last 3 Encounters:  04/30/20 276 lb 6.4 oz (125.4 kg)  03/01/20 290 lb (131.5 kg)  12/30/19 285 lb (129.3 kg)      Physical Exam Vitals reviewed.  Constitutional:      General: She is not in acute distress.    Appearance: Normal appearance. She is well-developed. She is not diaphoretic.  HENT:     Head: Normocephalic and atraumatic.     Right Ear: Tympanic membrane, ear canal and external ear normal.     Left Ear: Tympanic membrane, ear canal and external ear normal.  Eyes:     General: No scleral icterus.    Conjunctiva/sclera: Conjunctivae normal.     Pupils: Pupils are equal, round, and reactive to light.  Neck:     Thyroid: No thyromegaly.  Cardiovascular:     Rate and Rhythm: Normal rate and regular rhythm.     Pulses: Normal pulses.     Heart sounds: Normal heart sounds. No murmur heard.   Pulmonary:     Effort: Pulmonary effort is normal. No respiratory distress.     Breath sounds: Normal breath sounds. No wheezing or rales.  Abdominal:     General: There is no distension.     Palpations: Abdomen is soft.     Tenderness: There is no abdominal tenderness.  Musculoskeletal:        General: No deformity.     Cervical back: Neck supple.     Right lower leg: No edema.     Left lower leg: No edema.   Lymphadenopathy:     Cervical: No cervical adenopathy.  Skin:    General: Skin is warm and dry.     Findings: No rash.  Neurological:     Mental Status: She is alert and oriented to person, place, and time. Mental status is at baseline.     Sensory: No sensory deficit.     Motor: No weakness.     Gait: Gait normal.  Psychiatric:        Mood and Affect: Mood normal.        Behavior: Behavior normal.        Thought Content: Thought content normal.       Last depression screening scores PHQ 2/9 Scores 04/30/2020 12/12/2019 03/25/2019  PHQ - 2 Score _0 PHQ- 9 Score _1 Last fall risk screening Fall Risk  04/30/2020  Falls in the past year? 0  Number falls in past yr: 0  Injury with Fall? 0  Risk for fall due to : No Fall Risks  Follow up Falls evaluation completed   Last Audit-C alcohol use screening Alcohol Use Disorder Test (AUDIT) 04/30/2020  1. How often do you have a drink containing alcohol? 1  2. How many drinks containing alcohol do you have on a typical day when you are drinking? 0  3. How often do you have six or more drinks on one occasion? 0  AUDIT-C Score 1  Alcohol Brief Interventions/Follow-up AUDIT Score <7 follow-up not indicated  Some encounter information is confidential and restricted. Go to Review Flowsheets activity to see all data.   A score of 3 or more in women, and 4 or more in men indicates increased risk for alcohol abuse, EXCEPT if all of the points are from question 1   Results for orders placed or performed in visit on 04/30/20  Comprehensive metabolic panel  Result Value Ref Range   Glucose 214 (H) 65 - 99 mg/dL   BUN 5 (L) 6 - 24 mg/dL   Creatinine, Ser 0.68 0.57 - 1.00 mg/dL   eGFR 104 >59 mL/min/1.73   BUN/Creatinine Ratio 7 (L) 9 - 23   Sodium 144 134 - 144 mmol/L   Potassium 4.5 3.5 - 5.2 mmol/L   Chloride 100 96 - 106 mmol/L   CO2 19 (L) 20 - 29 mmol/L   Calcium 10.2 8.7 - 10.2 mg/dL   Total Protein 7.4 6.0 - 8.5 g/dL    Albumin 4.8 3.8 - 4.9 g/dL   Globulin, Total 2.6 1.5 - 4.5 g/dL   Albumin/Globulin Ratio 1.8 1.2 - 2.2   Bilirubin Total 0.2 0.0 - 1.2 mg/dL   Alkaline Phosphatase 77 44 - 121 IU/L   AST 56 (H) 0 - 40 IU/L   ALT 30 0 - 32 IU/L  Lipid Panel With LDL/HDL Ratio  Result Value Ref Range   Cholesterol, Total 183 100 - 199 mg/dL   Triglycerides 251 (H) 0 - 149 mg/dL   HDL 45 >39 mg/dL   VLDL Cholesterol Cal 42 (H) 5 - 40 mg/dL   LDL Chol Calc (NIH) 96 0 - 99 mg/dL   LDL/HDL Ratio 2.1 0.0 - 3.2 ratio  TSH  Result Value Ref Range   TSH 0.855 0.450 - 4.500 uIU/mL  Hepatitis C antibody  Result Value Ref Range   Hep C Virus Ab <0.1 0.0 - 0.9 s/co ratio  CBC with Differential/Platelet  Result Value Ref Range   WBC 11.5 (H) 3.4 - 10.8 x10E3/uL   RBC 5.44 (H) 3.77 - 5.28 x10E6/uL   Hemoglobin 16.6 (H) 11.1 - 15.9 g/dL   Hematocrit 48.2 (H) 34.0 - 46.6 %   MCV 89 79 - 97 fL   MCH 30.5 26.6 - 33.0 pg   MCHC 34.4 31.5 - 35.7 g/dL   RDW 12.2 11.7 - 15.4 %   Platelets 393 150 - 450 x10E3/uL   Neutrophils 60 Not Estab. %   Lymphs 31 Not Estab. %   Monocytes 5 Not Estab. %   Eos 3 Not Estab. %   Basos 1 Not Estab. %   Neutrophils Absolute 6.9 1.4 - 7.0 x10E3/uL   Lymphocytes Absolute 3.6 (H) 0.7 - 3.1 x10E3/uL   Monocytes Absolute 0.6 0.1 - 0.9 x10E3/uL   EOS (ABSOLUTE) 0.3 0.0 - 0.4 x10E3/uL   Basophils Absolute 0.1 0.0 - 0.2 x10E3/uL   Immature Granulocytes 0 Not Estab. %   Immature Grans (Abs) 0.0 0.0 - 0.1 x10E3/uL  Vitamin B12  Result Value Ref Range   Vitamin B-12 321  232 - 1,245 pg/mL  VITAMIN D 25 Hydroxy (Vit-D Deficiency, Fractures)  Result Value Ref Range   Vit D, 25-Hydroxy WILL FOLLOW   POCT glycosylated hemoglobin (Hb A1C)  Result Value Ref Range   Hemoglobin A1C 8.6 (A) 4.0 - 5.6 %    Assessment & Plan    Routine Health Maintenance and Physical Exam  Exercise Activities and Dietary recommendations Goals   None     Immunization History  Administered Date(s)  Administered  . Influenza,inj,Quad PF,6+ Mos 10/21/2017, 12/02/2018, 12/12/2019  . Influenza-Unspecified 02/07/2015  . Moderna Sars-Covid-2 Vaccination 09/23/2019, 10/21/2019  . Pneumococcal Polysaccharide-23 10/21/2017  . Tdap 04/22/2016    Health Maintenance  Topic Date Due  . OPHTHALMOLOGY EXAM  11/28/2018  . COVID-19 Vaccine (3 - Booster for Moderna series) 04/22/2020  . HEMOGLOBIN A1C  10/28/2020  . PAP SMEAR-Modifier  11/18/2020  . FOOT EXAM  04/30/2021  . MAMMOGRAM  01/12/2022  . COLONOSCOPY (Pts 45-48yr Insurance coverage will need to be confirmed)  07/22/2025  . TETANUS/TDAP  04/22/2026  . INFLUENZA VACCINE  Completed  . PNEUMOCOCCAL POLYSACCHARIDE VACCINE AGE 54-64 HIGH RISK  Completed  . Hepatitis C Screening  Completed  . HIV Screening  Completed  . HPV VACCINES  Aged Out    Discussed health benefits of physical activity, and encouraged her to engage in regular exercise appropriate for her age and condition.  Problem List Items Addressed This Visit      Cardiovascular and Mediastinum   Hypertension    Well controlled Continue current medications Recheck metabolic panel F/u in 6 months       Relevant Medications   rosuvastatin (CRESTOR) 5 MG tablet     Endocrine   Diabetes [place] ([place]    Uncontrolled with hyperglycemia Has not been getting Ozempic from the pharmacy as they need more documentation that it is for diabetes and not weight loss - will resume Foot exam today Advised to schedule eye exam Vaccines UTD On statin Assoc with/complicated by HTN and HLD F/u in 3 months and repeat A1c       Relevant Medications   Semaglutide, 1 MG/DOSE, (OZEMPIC, 1 MG/DOSE,) 2 MG/1.5ML SOPN   rosuvastatin (CRESTOR) 5 MG tablet   Other Relevant Orders   POCT glycosylated hemoglobin (Hb A1C) (Completed)   Hyperlipidemia associated with type 2 diabetes mellitus (HCC)    Previously well controlled Continue statin Repeat FLP and CMP Goal LDL < 70       Relevant Medications   Semaglutide, 1 MG/DOSE, (OZEMPIC, 1 MG/DOSE,) 2 MG/1.5ML SOPN   rosuvastatin (CRESTOR) 5 MG tablet   Other Relevant Orders   Lipid Panel With LDL/HDL Ratio (Completed)   PCOS (polycystic ovarian syndrome)   Relevant Orders   TSH (Completed)     Other   Severe recurrent major depression without psychotic features (HCC)    Stable Followed by Psych No changes to meds today      Relevant Medications   citalopram (CELEXA) 40 MG tablet   B12 deficiency   Relevant Orders   Vitamin B12 (Completed)   Morbid [place] ([place]    Discussed importance of healthy weight management Discussed diet and exercise BMI >35 and assoc with HTN, HLD, T2DM      Relevant Medications   amphetamine-dextroamphetamine (ADDERALL XR) 30 MG 24 hr capsule   Semaglutide, 1 MG/DOSE, (OZEMPIC, 1 MG/DOSE,) 2 MG/1.5ML SOPN   Other Relevant Orders   TSH (Completed)   Acute bilateral low back pain without sciatica   Relevant Medications  meloxicam (MOBIC) 15 MG tablet    Other Visit Diagnoses    Encounter for annual physical exam    -  Primary   Relevant Orders   Comprehensive metabolic panel (Completed)   Lipid Panel With LDL/HDL Ratio (Completed)   TSH (Completed)   Hepatitis C antibody (Completed)   CBC with Differential/Platelet (Completed)   Elevated hemoglobin (HCC)       Relevant Orders   CBC with Differential/Platelet (Completed)   Avitaminosis D       Relevant Orders   VITAMIN D 25 Hydroxy (Vit-D Deficiency, Fractures) (Completed)   Encounter for hepatitis C screening test for low risk patient       Relevant Orders   Hepatitis C antibody (Completed)       Return in about 3 months (around 07/28/2020) for chronic disease f/u.     I, Bonnie Paganini, MD, have reviewed all documentation for this visit. The documentation on 05/01/20 for the exam, diagnosis, procedures, and orders are all accurate and complete.   Brenn Deziel, Dionne Bucy, MD, MPH [facility]

## 2020-04-30 NOTE — Patient Instructions (Incomplete)
Preventive Care 84-54 Years Old, Female Preventive care refers to lifestyle choices and visits with your health care provider that can promote health and wellness. This includes:  A yearly physical exam. This is also called an annual wellness visit.  Regular dental and eye exams.  Immunizations.  Screening for certain conditions.  Healthy lifestyle choices, such as: ? Eating a healthy diet. ? Getting regular exercise. ? Not using drugs or products that contain nicotine and tobacco. ? Limiting alcohol use. What can I expect for my preventive care visit? Physical exam Your health care provider will check your:  Height and weight. These may be used to calculate your BMI (body mass index). BMI is a measurement that tells if you are at a healthy weight.  Heart rate and blood pressure.  Body temperature.  Skin for abnormal spots. Counseling Your health care provider may ask you questions about your:  Past medical problems.  Family's medical history.  Alcohol, tobacco, and drug use.  Emotional well-being.  Home life and relationship well-being.  Sexual activity.  Diet, exercise, and sleep habits.  Work and work Statistician.  Access to firearms.  Method of birth control.  Menstrual cycle.  Pregnancy history. What immunizations do I need? Vaccines are usually given at various ages, according to a schedule. Your health care provider will recommend vaccines for you based on your age, medical history, and lifestyle or other factors, such as travel or where you work.   What tests do I need? Blood tests  Lipid and cholesterol levels. These may be checked every 5 years, or more often if you are over 3 years old.  Hepatitis C test.  Hepatitis B test. Screening  Lung cancer screening. You may have this screening every year starting at age 73 if you have a 30-pack-year history of smoking and currently smoke or have quit within the past 15 years.  Colorectal cancer  screening. ? All adults should have this screening starting at age 52 and continuing until age 17. ? Your health care provider may recommend screening at age 49 if you are at increased risk. ? You will have tests every 1-10 years, depending on your results and the type of screening test.  Diabetes screening. ? This is done by checking your blood sugar (glucose) after you have not eaten for a while (fasting). ? You may have this done every 1-3 years.  Mammogram. ? This may be done every 1-2 years. ? Talk with your health care provider about when you should start having regular mammograms. This may depend on whether you have a family history of breast cancer.  BRCA-related cancer screening. This may be done if you have a family history of breast, ovarian, tubal, or peritoneal cancers.  Pelvic exam and Pap test. ? This may be done every 3 years starting at age 10. ? Starting at age 11, this may be done every 5 years if you have a Pap test in combination with an HPV test. Other tests  STD (sexually transmitted disease) testing, if you are at risk.  Bone density scan. This is done to screen for osteoporosis. You may have this scan if you are at high risk for osteoporosis. Talk with your health care provider about your test results, treatment options, and if necessary, the need for more tests. Follow these instructions at home: Eating and drinking  Eat a diet that includes fresh fruits and vegetables, whole grains, lean protein, and low-fat dairy products.  Take vitamin and mineral supplements  as recommended by your health care provider.  Do not drink alcohol if: ? Your health care provider tells you not to drink. ? You are pregnant, may be pregnant, or are planning to become pregnant.  If you drink alcohol: ? Limit how much you have to 0-1 drink a day. ? Be aware of how much alcohol is in your drink. In the [place] one drink equals one 12 oz bottle of beer (355 mL), one 5 oz glass of  wine (148 mL), or one 1 oz glass of hard liquor (44 mL).   Lifestyle  Take daily care of your teeth and gums. Brush your teeth every morning and night with fluoride toothpaste. Floss one time each day.  Stay active. Exercise for at least 30 minutes 5 or more days each week.  Do not use any products that contain nicotine or tobacco, such as cigarettes, e-cigarettes, and chewing tobacco. If you need help quitting, ask your health care provider.  Do not use drugs.  If you are sexually active, practice safe sex. Use a condom or other form of protection to prevent STIs (sexually transmitted infections).  If you do not wish to become pregnant, use a form of birth control. If you plan to become pregnant, see your health care provider for a prepregnancy visit.  If told by your health care provider, take low-dose aspirin daily starting at age 50.  Find healthy ways to cope with stress, such as: ? Meditation, yoga, or listening to music. ? Journaling. ? Talking to a trusted person. ? Spending time with friends and family. Safety  Always wear your seat belt while driving or riding in a vehicle.  Do not drive: ? If you have been drinking alcohol. Do not ride with someone who has been drinking. ? When you are tired or distracted. ? While texting.  Wear a helmet and other protective equipment during sports activities.  If you have firearms in your house, make sure you follow all gun safety procedures. What's next?  Visit your health care provider once a year for an annual wellness visit.  Ask your health care provider how often you should have your eyes and teeth checked.  Stay up to date on all vaccines. This information is not intended to replace advice given to you by your health care provider. Make sure you discuss any questions you have with your health care provider. Document Revised: 11/22/2019 Document Reviewed: 10/29/2017 Elsevier Patient Education  2021 Elsevier Inc.  

## 2020-05-01 NOTE — Assessment & Plan Note (Signed)
Well controlled Continue current medications Recheck metabolic panel F/u in 6 months  

## 2020-05-01 NOTE — Assessment & Plan Note (Signed)
Uncontrolled with hyperglycemia Has not been getting Ozempic from the pharmacy as they need more documentation that it is for diabetes and not weight loss - will resume Foot exam today Advised to schedule eye exam Vaccines UTD On statin Assoc with/complicated by HTN and HLD F/u in 3 months and repeat A1c

## 2020-05-01 NOTE — Assessment & Plan Note (Signed)
Previously well controlled Continue statin Repeat FLP and CMP Goal LDL < 70 

## 2020-05-01 NOTE — Assessment & Plan Note (Signed)
Stable Followed by Psych No changes to meds today

## 2020-05-01 NOTE — Assessment & Plan Note (Signed)
Discussed importance of healthy weight management Discussed diet and exercise BMI >35 and assoc with HTN, HLD, T2DM

## 2020-05-02 ENCOUNTER — Encounter: Payer: Self-pay | Admitting: Family Medicine

## 2020-05-02 LAB — COMPREHENSIVE METABOLIC PANEL
ALT: 30 IU/L (ref 0–32)
AST: 56 IU/L — ABNORMAL HIGH (ref 0–40)
Albumin/Globulin Ratio: 1.8 (ref 1.2–2.2)
Albumin: 4.8 g/dL (ref 3.8–4.9)
Alkaline Phosphatase: 77 IU/L (ref 44–121)
BUN/Creatinine Ratio: 7 — ABNORMAL LOW (ref 9–23)
BUN: 5 mg/dL — ABNORMAL LOW (ref 6–24)
Bilirubin Total: 0.2 mg/dL (ref 0.0–1.2)
CO2: 19 mmol/L — ABNORMAL LOW (ref 20–29)
Calcium: 10.2 mg/dL (ref 8.7–10.2)
Chloride: 100 mmol/L (ref 96–106)
Creatinine, Ser: 0.68 mg/dL (ref 0.57–1.00)
Globulin, Total: 2.6 g/dL (ref 1.5–4.5)
Glucose: 214 mg/dL — ABNORMAL HIGH (ref 65–99)
Potassium: 4.5 mmol/L (ref 3.5–5.2)
Sodium: 144 mmol/L (ref 134–144)
Total Protein: 7.4 g/dL (ref 6.0–8.5)
eGFR: 104 mL/min/{1.73_m2} (ref >59)

## 2020-05-02 LAB — CBC WITH DIFFERENTIAL/PLATELET
Basophils Absolute: 0.1 10*3/uL (ref 0.0–0.2)
Basos: 1 %
EOS (ABSOLUTE): 0.3 10*3/uL (ref 0.0–0.4)
Eos: 3 %
Hematocrit: 48.2 % — ABNORMAL HIGH (ref 34.0–46.6)
Hemoglobin: 16.6 g/dL — ABNORMAL HIGH (ref 11.1–15.9)
Immature Grans (Abs): 0 10*3/uL (ref 0.0–0.1)
Immature Granulocytes: 0 %
Lymphocytes Absolute: 3.6 10*3/uL — ABNORMAL HIGH (ref 0.7–3.1)
Lymphs: 31 %
MCH: 30.5 pg (ref 26.6–33.0)
MCHC: 34.4 g/dL (ref 31.5–35.7)
MCV: 89 fL (ref 79–97)
Monocytes Absolute: 0.6 10*3/uL (ref 0.1–0.9)
Monocytes: 5 %
Neutrophils Absolute: 6.9 10*3/uL (ref 1.4–7.0)
Neutrophils: 60 %
Platelets: 393 10*3/uL (ref 150–450)
RBC: 5.44 x10E6/uL — ABNORMAL HIGH (ref 3.77–5.28)
RDW: 12.2 % (ref 11.7–15.4)
WBC: 11.5 10*3/uL — ABNORMAL HIGH (ref 3.4–10.8)

## 2020-05-02 LAB — LIPID PANEL WITH LDL/HDL RATIO
Cholesterol, Total: 183 mg/dL (ref 100–199)
HDL: 45 mg/dL (ref >39)
LDL Chol Calc (NIH): 96 mg/dL (ref 0–99)
LDL/HDL Ratio: 2.1 ratio (ref 0.0–3.2)
Triglycerides: 251 mg/dL — ABNORMAL HIGH (ref 0–149)
VLDL Cholesterol Cal: 42 mg/dL — ABNORMAL HIGH (ref 5–40)

## 2020-05-02 LAB — VITAMIN D 25 HYDROXY (VIT D DEFICIENCY, FRACTURES): Vit D, 25-Hydroxy: 55.5 ng/mL (ref 30.0–100.0)

## 2020-05-02 LAB — VITAMIN B12: Vitamin B-12: 321 pg/mL (ref 232–1245)

## 2020-05-02 LAB — HEPATITIS C ANTIBODY: Hep C Virus Ab: <0.1 s/co ratio (ref 0.0–0.9)

## 2020-05-02 LAB — TSH: TSH: 0.855 u[IU]/mL (ref 0.450–4.500)

## 2020-05-07 ENCOUNTER — Encounter: Payer: Self-pay | Admitting: Family Medicine

## 2020-07-31 ENCOUNTER — Other Ambulatory Visit: Payer: Self-pay

## 2020-07-31 ENCOUNTER — Encounter: Payer: Self-pay | Admitting: Family Medicine

## 2020-07-31 ENCOUNTER — Ambulatory Visit (INDEPENDENT_AMBULATORY_CARE_PROVIDER_SITE_OTHER): Payer: BC Managed Care – PPO | Admitting: Family Medicine

## 2020-07-31 VITALS — BP 136/86 | HR 84 | Temp 98.5°F | Resp 16 | Ht 70.0 in | Wt 270.0 lb

## 2020-07-31 DIAGNOSIS — E1159 Type 2 diabetes mellitus with other circulatory complications: Secondary | ICD-10-CM

## 2020-07-31 DIAGNOSIS — Z23 Encounter for immunization: Secondary | ICD-10-CM

## 2020-07-31 DIAGNOSIS — E1169 Type 2 diabetes mellitus with other specified complication: Secondary | ICD-10-CM | POA: Diagnosis not present

## 2020-07-31 DIAGNOSIS — E785 Hyperlipidemia, unspecified: Secondary | ICD-10-CM

## 2020-07-31 DIAGNOSIS — I152 Hypertension secondary to endocrine disorders: Secondary | ICD-10-CM

## 2020-07-31 LAB — POCT GLYCOSYLATED HEMOGLOBIN (HGB A1C)
Est. average glucose Bld gHb Est-mCnc: 171
Hemoglobin A1C: 7.6 % — AB (ref 4.0–5.6)

## 2020-07-31 MED ORDER — OZEMPIC (2 MG/DOSE) 8 MG/3ML ~~LOC~~ SOPN: 2.0000 mg | PEN_INJECTOR | SUBCUTANEOUS | 3 refills | Status: DC

## 2020-07-31 NOTE — Assessment & Plan Note (Signed)
Previously well controlled Continue statin Reviewed last lipid panel Goal LDL less than 70

## 2020-07-31 NOTE — Assessment & Plan Note (Signed)
Uncontrolled with hyperglycemia Also associated with/complicated by HTN and HLD A1c is improving Not able to get her Ozempic consistently Changed to Ozempic prescription to help with the pharmacy with this Advised to schedule eye exam Up-to-date on foot exam Vaccines up-to-date On statin Follow-up in 3 months and repeat A1c

## 2020-07-31 NOTE — Assessment & Plan Note (Signed)
Well controlled ?Continue current medications ?Reviewed recent metabolic panel ?F/u in 3 months  ?

## 2020-07-31 NOTE — Assessment & Plan Note (Signed)
BMI of 38 and associated with HTN, HLD, T2DM Discussed importance of healthy weight management Discussed diet and exercise

## 2020-07-31 NOTE — Progress Notes (Signed)
Established patient visit   Patient: Bonnie Garcia   DOB: February 25, 1966   55 y.o. Female  MRN: [id number] Visit Date: 07/31/2020  Today's healthcare provider: Lavon Paganini, MD   Chief Complaint  Patient presents with  . Diabetes  . Hypertension  . Hyperlipidemia   Subjective    Diabetes Pertinent negatives for hypoglycemia include no dizziness or headaches. Pertinent negatives for diabetes include no chest pain, no fatigue, no polydipsia, no polyphagia, no polyuria and no weakness.  Hypertension Pertinent negatives include no chest pain, headaches, neck pain, palpitations or shortness of breath.  Hyperlipidemia Pertinent negatives include no chest pain or shortness of breath.    Diabetes Mellitus Type II, follow-up  Lab Results  Component Value Date   HGBA1C 7.6 (A) 07/31/2020   HGBA1C 8.6 (A) 04/30/2020   HGBA1C 7.7 (A) 12/12/2019   Last seen for diabetes 3 months ago.  Management since then includes continuing the same treatment. She reports excellent compliance with treatment. She is not having side effects. She is having a hard time with insurance because of the change to 2mg  dosage of Ozempic. She is requesting for a 3 month supply of refills of Ozempic. She reports she is losing some weight.   Home blood sugar records: not being checked  Episodes of hypoglycemia? No   Current insulin regiment: none Most Recent Eye Exam: due   --------------------------------------------------------------------------------------------------- Hypertension, follow-up  BP Readings from Last 3 Encounters:  07/31/20 136/86  04/30/20 132/89  03/01/20 (S) (!) 132/103   Wt Readings from Last 3 Encounters:  07/31/20 270 lb (122.5 kg)  04/30/20 276 lb 6.4 oz (125.4 kg)  03/01/20 290 lb (131.5 kg)     She was last seen for hypertension 3 months ago.  BP at that visit was 132/89. Management since that visit includes no changes. She reports excellent compliance  with treatment. She is not having side effects.  She is not exercising. She is adherent to low salt diet.   Outside blood pressures are not being checked.  She does not smoke.  Use of agents associated with hypertension: none.   --------------------------------------------------------------------------------------------------- Lipid/Cholesterol, follow-up  Last Lipid Panel: Lab Results  Component Value Date   CHOL 183 04/30/2020   LDLCALC 96 04/30/2020   HDL 45 04/30/2020   TRIG 251 (H) 04/30/2020    She was last seen for this 3 months ago.  Management since that visit includes no changes.  She reports excellent compliance with treatment. She is not having side effects.  Symptoms: No appetite changes No foot ulcerations  No chest pain No chest pressure/discomfort  No dyspnea No orthopnea  No fatigue No lower extremity edema  No palpitations No paroxysmal nocturnal dyspnea  No nausea Yes numbness or tingling of extremity  No polydipsia No polyuria  No speech difficulty No syncope   She is following a Regular diet. Current exercise: none  Last metabolic panel Lab Results  Component Value Date   GLUCOSE 214 (H) 04/30/2020   NA 144 04/30/2020   K 4.5 04/30/2020   BUN 5 (L) 04/30/2020   CREATININE 0.68 04/30/2020   GFRNONAA 97 06/23/2019   GFRAA 111 06/23/2019   CALCIUM 10.2 04/30/2020   AST 56 (H) 04/30/2020   ALT 30 04/30/2020   The 10-year ASCVD risk score Mikey Bussing DC Jr., et al., 2013) is: 5.3%  ---------------------------------------------------------------------------------------------------   Patient Active Problem List   Diagnosis Date Noted  . Acute bilateral low back pain without sciatica 06/23/2019  .  Myalgia 12/17/2017  . Thyroid nodule 10/21/2017  . Hyperlipidemia associated with type 2 diabetes mellitus ([place]) 10/21/2017  . PCOS (polycystic ovarian syndrome) 10/21/2017  . Hirsutism 10/21/2017  . Dysuria 10/21/2017  . Severe recurrent major  depression without psychotic features ([place]) 09/11/2017  . ADHD (attention deficit hyperactivity disorder) 09/11/2017  . Diabetes mellitus ([place]) 09/11/2017  . Hypertension associated with diabetes ([place]) 09/11/2017  . B12 deficiency 10/13/2013  . Morbid obesity ([place]) 10/13/2013  . Ureteric stone 06/08/2013  . Calculus of kidney 01/21/2013   Social History   Tobacco Use  . Smoking status: Former Smoker    Packs/day: 1.00    Years: 15.00    Pack years: 15.00    Types: Cigarettes    Quit date: 2009    Years since quitting: 13.4  . Smokeless tobacco: Never Used  Vaping Use  . Vaping Use: Never used  Substance Use Topics  . Alcohol use: Yes    Comment: socially, not even monthly  . Drug use: No   Allergies  Allergen Reactions  . Erythromycin Other (See Comments)    Reaction:  GI upset   . Penicillins Hives and Other (See Comments)    Has patient had a PCN reaction causing immediate rash, facial/tongue/throat swelling, SOB or lightheadedness with hypotension: No Has patient had a PCN reaction causing severe rash involving mucus membranes or skin necrosis: No Has patient had a PCN reaction that required hospitalization No Has patient had a PCN reaction occurring within the last 10 years: No If all of the above answers are "NO", then may proceed with Cephalosporin use.       Medications: Outpatient Medications Prior to Visit  Medication Sig  . amphetamine-dextroamphetamine (ADDERALL XR) 30 MG 24 hr capsule Take 30 mg by mouth daily.  Marland Kitchen aspirin EC 81 MG tablet Take 81 mg by mouth daily.  . citalopram (CELEXA) 40 MG tablet Take 1 tablet (40 mg total) by mouth daily.  . cloNIDine (CATAPRES) 0.1 MG tablet Take 0.1 mg by mouth as needed.   . Dapagliflozin-metFORMIN HCl ER (XIGDUO XR) 07-998 MG TB24 Take 1 tablet by mouth 2 (two) times daily.  Marland Kitchen gabapentin (NEURONTIN) 300 MG capsule Take 300 mg by mouth 3 (three) times daily.   Marland Kitchen glucose blood test strip Use as instructed to test  blood sugar 2-3 times daily  . Insulin Pen Needle (B-D UF III MINI PEN NEEDLES) 31G X 5 MM MISC Use as directed to inject ozempic weekly  . LORazepam (ATIVAN) 1 MG tablet TK 1 T PO BID UTD FOR PANIC SYMPTOMS  . meloxicam (MOBIC) 15 MG tablet Take 1 tablet (15 mg total) by mouth daily.  . rosuvastatin (CRESTOR) 5 MG tablet Take 1 tablet (5 mg total) by mouth daily.  Marland Kitchen SPRAVATO, 56 MG DOSE, 28 MG/DEVICE SOPK 1 Dose 2 (two) times a week.  . traZODone (DESYREL) 150 MG tablet   . Vitamin D, Ergocalciferol, (DRISDOL) 1.25 MG (50000 UNIT) CAPS capsule Take 1 capsule (50,000 Units total) by mouth every 7 (seven) days.  Marland Kitchen zolpidem (AMBIEN) 10 MG tablet Take 10 mg by mouth at bedtime. Rotate with trazodone  . [DISCONTINUED] Semaglutide, 1 MG/DOSE, (OZEMPIC, 1 MG/DOSE,) 2 MG/1.5ML SOPN Inject 2 mg into the skin once a week.   No facility-administered medications prior to visit.    Review of Systems  Constitutional: Negative for activity change, appetite change, chills, fatigue and fever.  HENT: Negative for ear pain, nosebleeds, rhinorrhea, sinus pressure, sinus pain and sore  throat.   Eyes: Negative for pain and visual disturbance.  Respiratory: Negative for cough, chest tightness, shortness of breath and wheezing.   Cardiovascular: Positive for leg swelling. Negative for chest pain and palpitations.  Gastrointestinal: Negative for abdominal pain, blood in stool, diarrhea, nausea and vomiting.  Endocrine: Negative for polydipsia, polyphagia and polyuria.  Genitourinary: Negative for flank pain, frequency, pelvic pain and urgency.  Musculoskeletal: Negative for back pain, neck pain and neck stiffness.  Neurological: Negative for dizziness, syncope, weakness, light-headedness, numbness and headaches.       Objective    BP 136/86 (BP Location: Left Arm, Patient Position: Sitting, Cuff Size: Large)   Pulse 84   Temp 98.5 F (36.9 C) (Oral)   Resp 16   Ht 5\' 10"  (1.778 m)   Wt 270 lb (122.5 kg)    SpO2 98%   BMI 38.74 kg/m  BP Readings from Last 3 Encounters:  07/31/20 136/86  04/30/20 132/89  03/01/20 (S) (!) 132/103   Wt Readings from Last 3 Encounters:  07/31/20 270 lb (122.5 kg)  04/30/20 276 lb 6.4 oz (125.4 kg)  03/01/20 290 lb (131.5 kg)       Physical Exam Vitals reviewed.  Constitutional:      General: She is not in acute distress.    Appearance: Normal appearance. She is well-developed. She is not diaphoretic.  HENT:     Head: Normocephalic and atraumatic.  Eyes:     General: No scleral icterus.    Conjunctiva/sclera: Conjunctivae normal.  Neck:     Thyroid: No thyromegaly.  Cardiovascular:     Rate and Rhythm: Normal rate and regular rhythm.     Pulses: Normal pulses.     Heart sounds: Normal heart sounds. No murmur heard.   Pulmonary:     Effort: Pulmonary effort is normal. No respiratory distress.     Breath sounds: Normal breath sounds. No wheezing, rhonchi or rales.  Musculoskeletal:     Cervical back: Neck supple.     Right lower leg: No edema.     Left lower leg: No edema.  Lymphadenopathy:     Cervical: No cervical adenopathy.  Skin:    General: Skin is warm and dry.     Findings: No rash.  Neurological:     Mental Status: She is alert and oriented to person, place, and time. Mental status is at baseline.  Psychiatric:        Mood and Affect: Mood normal.        Behavior: Behavior normal.      Results for orders placed or performed in visit on 07/31/20  POCT glycosylated hemoglobin (Hb A1C)  Result Value Ref Range   Hemoglobin A1C 7.6 (A) 4.0 - 5.6 %   Est. average glucose Bld gHb Est-mCnc 171     Assessment & Plan     Problem List Items Addressed This Visit      Cardiovascular and Mediastinum   Hypertension associated with diabetes ([place])    Well controlled Continue current medications Reviewed recent metabolic panel F/u in 3 months       Relevant Medications   Semaglutide, 2 MG/DOSE, (OZEMPIC, 2 MG/DOSE,) 8 MG/3ML  SOPN     Endocrine   Diabetes mellitus ([place]) - Primary    Uncontrolled with hyperglycemia Also associated with/complicated by HTN and HLD A1c is improving Not able to get her Ozempic consistently Changed to Ozempic prescription to help with the pharmacy with this Advised to schedule eye exam Up-to-date on  foot exam Vaccines up-to-date On statin Follow-up in 3 months and repeat A1c      Relevant Medications   Semaglutide, 2 MG/DOSE, (OZEMPIC, 2 MG/DOSE,) 8 MG/3ML SOPN   Other Relevant Orders   POCT glycosylated hemoglobin (Hb A1C) (Completed)   Hyperlipidemia associated with type 2 diabetes mellitus ([facility])    Previously well controlled Continue statin Reviewed last lipid panel Goal LDL less than 70      Relevant Medications   Semaglutide, 2 MG/DOSE, (OZEMPIC, 2 MG/DOSE,) 8 MG/3ML SOPN     Other   Morbid obesity ([facility])    BMI of 38 and associated with HTN, HLD, T2DM Discussed importance of healthy weight management Discussed diet and exercise       Relevant Medications   Semaglutide, 2 MG/DOSE, (OZEMPIC, 2 MG/DOSE,) 8 MG/3ML SOPN    Other Visit Diagnoses    Need for shingles vaccine       Relevant Orders   Varicella-zoster vaccine IM (Completed)       Return in about 3 months (around 10/31/2020) for chronic disease f/u.       I,Essence Turner,acting as a Education administrator for Lavon Paganini, MD.,have documented all relevant documentation on the behalf of Lavon Paganini, MD,as directed by  Lavon Paganini, MD while in the presence of Lavon Paganini, MD.  I, Lavon Paganini, MD, have reviewed all documentation for this visit. The documentation on 07/31/20 for the exam, diagnosis, procedures, and orders are all accurate and complete.   Jamori Biggar, Dionne Bucy, MD, MPH [facility]

## 2020-08-03 ENCOUNTER — Telehealth: Payer: Self-pay

## 2020-08-03 NOTE — Telephone Encounter (Signed)
Copied from [place] [phone]. Topic: General - Other >> Aug 03, 2020  3:21 PM Pawlus, Brayton Layman A wrote: Reason for CRM: Pt stated she is not able to afford Semaglutide, 2 MG/DOSE, (OZEMPIC, 2 MG/DOSE,) 8 MG/3ML SOPN, pt asked if a generic could be sent in that her insurance would cover. Please advise.

## 2020-08-03 NOTE — Telephone Encounter (Signed)
Patient reports that medication is now over $200 due to increase in dosage. I did inform patient to look into savings card for lower out of pocket co-pay. Patient to call back if still needs to change medication.

## 2020-08-20 NOTE — Telephone Encounter (Signed)
Noted  

## 2020-09-19 ENCOUNTER — Other Ambulatory Visit: Payer: Self-pay

## 2020-09-19 ENCOUNTER — Ambulatory Visit
Admission: EM | Admit: 2020-09-19 | Discharge: 2020-09-19 | Disposition: A | Discharge status: Home / Self Care | Payer: BC Managed Care – PPO | Attending: Sports Medicine | Admitting: Sports Medicine

## 2020-09-19 DIAGNOSIS — J069 Acute upper respiratory infection, unspecified: Secondary | ICD-10-CM

## 2020-09-19 DIAGNOSIS — R0981 Nasal congestion: Secondary | ICD-10-CM

## 2020-09-19 DIAGNOSIS — R059 Cough, unspecified: Secondary | ICD-10-CM

## 2020-09-19 DIAGNOSIS — H938X1 Other specified disorders of right ear: Secondary | ICD-10-CM

## 2020-09-19 DIAGNOSIS — J40 Bronchitis, not specified as acute or chronic: Secondary | ICD-10-CM

## 2020-09-19 DIAGNOSIS — R519 Headache, unspecified: Secondary | ICD-10-CM

## 2020-09-19 DIAGNOSIS — B349 Viral infection, unspecified: Secondary | ICD-10-CM

## 2020-09-19 MED ORDER — AZITHROMYCIN 250 MG PO TABS: 250.0000 mg | ORAL_TABLET | Freq: Every day | ORAL | 0 refills | Status: DC

## 2020-09-19 MED ORDER — PROMETHAZINE-DM 6.25-15 MG/5ML PO SYRP: 5.0000 mL | ORAL_SOLUTION | Freq: Four times a day (QID) | ORAL | 0 refills | Status: DC | PRN

## 2020-09-19 MED ORDER — FLUTICASONE PROPIONATE 50 MCG/ACT NA SUSP: 2.0000 | Freq: Every day | NASAL | 0 refills | Status: DC

## 2020-09-19 MED ORDER — ALBUTEROL SULFATE HFA 108 (90 BASE) MCG/ACT IN AERS
1.0000 | INHALATION_SPRAY | Freq: Four times a day (QID) | RESPIRATORY_TRACT | 0 refills | Status: DC | PRN
Start: 2020-09-19 — End: 2022-01-15

## 2020-09-19 NOTE — Discharge Instructions (Addendum)
As we discussed, I am treating you for an acute bronchitis with an antibiotic.  This will also cover atypical bacteria that can be causing her cough.  Since you have had 2 negative COVID tests at home we will not retest you.  I have also prescribed an inhaler as you are wheezing.  I also prescribed a cough medicine.  I also prescribed a nasal steroid to help you with your nasal congestion. If your symptoms persist please see your primary care provider. If they worsen please go to the ER. Please see educational handouts. Please use over-the-counter meds such as Tylenol or Motrin for any fever or discomfort.  Plenty of rest, plenty of fluids.

## 2020-09-19 NOTE — ED Triage Notes (Signed)
Pt c/o sinus congestion for several days, not has cough and fever (101) today, also R ear pain. Pt reports cough is worse at night.

## 2020-09-22 NOTE — ED Provider Notes (Signed)
[facility]    CSN: [id number] Arrival date & time: 09/19/20  1814      History   Chief Complaint Chief Complaint  Patient presents with   Cough   Nasal Congestion   Otalgia    R    HPI Bonnie Garcia is a 55 y.o. female.   Patient is a 56 year old female who presents for evaluation of URI symptoms.  She has had her symptoms now off and on for quite a while.  The last few days she has had increasing symptoms including nasal congestion, chest congestion, cough, and fever to 101.  She is also complaining of some right ear discomfort.  Her cough is much worse at night especially laying flat and she is having difficulty sleeping.  She also reports having a sore throat now for about 5 days.  She has associated headaches and chills.  She is taken 2 home COVID test that were both negative.  She is not interested in doing another COVID test here in the office.  She did have COVID back in December 2021.  No recent COVID exposure.  She has been vaccinated x2 and has received her flu shot.  She is not working outside the home.  Her primary care provider is [facility].  I have reviewed her chart in detail and she does have multiple medical issues and is on multiple medications.  She denies any significant chest pain or shortness of breath.  No red flag signs or symptoms elicited on history.   Past Medical History:  Diagnosis Date   ADHD (attention deficit hyperactivity disorder)    Anxiety    Depression    Diabetes mellitus without complication ([place])    Hypertension    Kidney stones    Thyroid nodule     Patient Active Problem List   Diagnosis Date Noted   Acute bilateral low back pain without sciatica 06/23/2019   Myalgia 12/17/2017   Thyroid nodule 10/21/2017   Hyperlipidemia associated with type 2 diabetes mellitus ([place]) 10/21/2017   PCOS (polycystic ovarian syndrome) 10/21/2017   Hirsutism 10/21/2017   Dysuria 10/21/2017   Severe recurrent major  depression without psychotic features ([place]) 09/11/2017   ADHD (attention deficit hyperactivity disorder) 09/11/2017   Diabetes mellitus ([place]) 09/11/2017   Hypertension associated with diabetes ([place]) 09/11/2017   B12 deficiency 10/13/2013   Morbid obesity ([place]) 10/13/2013   Ureteric stone 06/08/2013   Calculus of kidney 01/21/2013    Past Surgical History:  Procedure Laterality Date   APPENDECTOMY     CHOLECYSTECTOMY     COLONOSCOPY WITH PROPOFOL N/A 07/23/2015   Procedure: COLONOSCOPY WITH PROPOFOL;  Surgeon: Lollie Sails, MD;  Location: [facility] ENDOSCOPY;  Service: Endoscopy;  Laterality: N/A;   ECTOPIC PREGNANCY SURGERY     ENDOMETRIAL ABLATION     ESOPHAGOGASTRODUODENOSCOPY (EGD) WITH PROPOFOL N/A 07/23/2015   Procedure: ESOPHAGOGASTRODUODENOSCOPY (EGD) WITH PROPOFOL;  Surgeon: Lollie Sails, MD;  Location: [facility] ENDOSCOPY;  Service: Endoscopy;  Laterality: N/A;   TUBAL LIGATION      OB History   No obstetric history on file.      Home Medications    Prior to Admission medications   Medication Sig Start Date End Date Taking? Authorizing Provider  albuterol (VENTOLIN HFA) 108 (90 Base) MCG/ACT inhaler Inhale 1-2 puffs into the lungs every 6 (six) hours as needed for wheezing or shortness of breath. 09/19/20  Yes Verda Cumins, MD  amphetamine-dextroamphetamine (ADDERALL XR) 30 MG 24 hr capsule Take  30 mg by mouth daily. 04/06/20  Yes [provider]  aspirin EC 81 MG tablet Take 81 mg by mouth daily.   Yes [provider]  azithromycin (ZITHROMAX) 250 MG tablet Take 1 tablet (250 mg total) by mouth daily. Take first 2 tablets together, then 1 every day until finished. 09/19/20  Yes Verda Cumins, MD  citalopram (CELEXA) 40 MG tablet Take 1 tablet (40 mg total) by mouth daily. 04/30/20  Yes Bacigalupo, Dionne Bucy, MD  cloNIDine (CATAPRES) 0.1 MG tablet Take 0.1 mg by mouth as needed.  11/08/18  Yes [provider]  Dapagliflozin-metFORMIN HCl ER  (XIGDUO XR) 07-998 MG TB24 Take 1 tablet by mouth 2 (two) times daily. 03/27/20  Yes Bacigalupo, Dionne Bucy, MD  fluticasone (FLONASE) 50 MCG/ACT nasal spray Place 2 sprays into both nostrils daily. 09/19/20  Yes Verda Cumins, MD  gabapentin (NEURONTIN) 300 MG capsule Take 300 mg by mouth 3 (three) times daily.  11/04/18  Yes [provider]  glucose blood test strip Use as instructed to test blood sugar 2-3 times daily 06/23/18  Yes Bacigalupo, Dionne Bucy, MD  Insulin Pen Needle (B-D UF III MINI PEN NEEDLES) 31G X 5 MM MISC Use as directed to inject ozempic weekly 12/02/18  Yes Bacigalupo, Dionne Bucy, MD  LORazepam (ATIVAN) 1 MG tablet TK 1 T PO BID UTD FOR PANIC SYMPTOMS 10/19/18  Yes [provider]  meloxicam (MOBIC) 15 MG tablet Take 1 tablet (15 mg total) by mouth daily. 04/30/20  Yes Bacigalupo, Dionne Bucy, MD  promethazine-dextromethorphan (PROMETHAZINE-DM) 6.25-15 MG/5ML syrup Take 5 mLs by mouth 4 (four) times daily as needed for cough. 09/19/20  Yes Verda Cumins, MD  rosuvastatin (CRESTOR) 5 MG tablet Take 1 tablet (5 mg total) by mouth daily. 04/30/20  Yes Bacigalupo, Dionne Bucy, MD  Semaglutide, 2 MG/DOSE, (OZEMPIC, 2 MG/DOSE,) 8 MG/3ML SOPN Inject 2 mg into the skin once a week. 07/31/20  Yes Bacigalupo, Dionne Bucy, MD  SPRAVATO, 56 MG DOSE, 28 MG/DEVICE SOPK 1 Dose 2 (two) times a week. 10/13/17  Yes [provider]  traZODone (DESYREL) 150 MG tablet  11/09/18  Yes [provider]  Vitamin D, Ergocalciferol, (DRISDOL) 1.25 MG (50000 UNIT) CAPS capsule Take 1 capsule (50,000 Units total) by mouth every 7 (seven) days. 04/30/20  Yes Bacigalupo, Dionne Bucy, MD  zolpidem (AMBIEN) 10 MG tablet Take 10 mg by mouth at bedtime. Rotate with trazodone 07/11/20  Yes [provider]    Family History Family History  Problem Relation Age of Onset   Breast cancer Paternal 46    Pancreatic cancer Paternal Aunt    Thyroid disease Paternal 62    Breast cancer Paternal  Grandmother    Diabetes Paternal Grandmother    Hypertension Paternal Grandmother    Diabetes Mother    Glaucoma Mother    Diabetes Father    Hypertension Father    Heart disease Father    Parkinson's disease Father    Kidney disease Father    Skin cancer Father    Depression Father    Stroke Father    Seizures Father    GER disease Father    Sleep apnea Father    Bell's palsy Father    Anxiety disorder Sister    Hypertension Sister    Arthritis Sister    Anxiety disorder Daughter    Depression Daughter    Depression Son    Anxiety disorder Son    Thyroid cancer Maternal Uncle  Suicidality Maternal Uncle    Cataracts Maternal Grandmother    Heart attack Maternal Grandfather 42   Throat cancer Paternal Grandfather    Colon cancer Neg Hx    Ovarian cancer Neg Hx    Cervical cancer Neg Hx     Social History Social History   Tobacco Use   Smoking status: Former    Packs/day: 1.00    Years: 15.00    Pack years: 15.00    Types: Cigarettes    Quit date: 2009    Years since quitting: 13.5   Smokeless tobacco: Never  Vaping Use   Vaping Use: Never used  Substance Use Topics   Alcohol use: Yes    Comment: socially, not even monthly   Drug use: No     Allergies   Erythromycin and Penicillins   Review of Systems Review of Systems  Constitutional:  Positive for chills and fever. Negative for activity change, appetite change, diaphoresis and fatigue.  HENT:  Positive for congestion, ear pain and sore throat. Negative for ear discharge, postnasal drip, rhinorrhea, sinus pressure, sinus pain and sneezing.   Eyes:  Negative for pain.  Respiratory:  Positive for cough. Negative for chest tightness and shortness of breath.   Cardiovascular:  Negative for chest pain and palpitations.  Gastrointestinal:  Negative for abdominal pain, diarrhea, nausea and vomiting.  Genitourinary:  Negative for dysuria.  Musculoskeletal:  Negative for back pain, myalgias and neck pain.   Skin:  Negative for color change, pallor, rash and wound.  Neurological:  Positive for headaches. Negative for dizziness, syncope, light-headedness and numbness.  All other systems reviewed and are negative.   Physical Exam Triage Vital Signs ED Triage Vitals  Enc Vitals Group     BP 09/19/20 1845 (!) 150/86     Pulse Rate 09/19/20 1845 99     Resp 09/19/20 1845 18     Temp 09/19/20 1845 99 F (37.2 C)     Temp Source 09/19/20 1845 Oral     SpO2 09/19/20 1845 99 %     Weight 09/19/20 1844 260 lb (117.9 kg)     Height 09/19/20 1844 '5\' 11"'$  (1.803 m)     Head Circumference --      Peak Flow --      Pain Score 09/19/20 1843 8     Pain Loc --      Pain Edu? --      Excl. in [place]? --    No data found.  Updated Vital Signs BP (!) 150/86 (BP Location: Left Arm)   Pulse 99   Temp 99 F (37.2 C) (Oral)   Resp 18   Ht '5\' 11"'$  (1.803 m)   Wt 117.9 kg   SpO2 99%   BMI 36.26 kg/m   Visual Acuity Right Eye Distance:   Left Eye Distance:   Bilateral Distance:    Right Eye Near:   Left Eye Near:    Bilateral Near:     Physical Exam Vitals and nursing note reviewed.  Constitutional:      General: She is not in acute distress.    Appearance: Normal appearance. She is not ill-appearing, toxic-appearing or diaphoretic.  HENT:     Head: Normocephalic and atraumatic.     Right Ear: Tympanic membrane normal.     Left Ear: Tympanic membrane normal.     Nose: Congestion present. No rhinorrhea.     Mouth/Throat:     Mouth: Mucous membranes are moist.  Eyes:  General: No scleral icterus.    Conjunctiva/sclera: Conjunctivae normal.     Pupils: Pupils are equal, round, and reactive to light.  Cardiovascular:     Rate and Rhythm: Normal rate and regular rhythm.     Pulses: Normal pulses.     Heart sounds: Normal heart sounds. No murmur heard.   No friction rub. No gallop.  Pulmonary:     Effort: Pulmonary effort is normal.     Breath sounds: No stridor. Wheezing present. No  rhonchi or rales.  Musculoskeletal:     Cervical back: Normal range of motion and neck supple. No rigidity or tenderness.  Lymphadenopathy:     Cervical: No cervical adenopathy.  Skin:    General: Skin is warm and dry.     Capillary Refill: Capillary refill takes less than 2 seconds.  Neurological:     General: No focal deficit present.     Mental Status: She is alert and oriented to person, place, and time.     UC Treatments / Results  Labs (all labs ordered are listed, but only abnormal results are displayed) Labs Reviewed - No data to display  EKG   Radiology No results found.  Procedures Procedures (including critical care time)  Medications Ordered in UC Medications - No data to display  Initial Impression / Assessment and Plan / UC Course  I have reviewed the triage vital signs and the nursing notes.  Pertinent labs & imaging results that were available during my care of the patient were reviewed by me and considered in my medical decision making (see chart for details).  Clinical impression: 1.  Cough with wheeze clinically she has bronchitis we will treat accordingly 2.  Upper respiratory infection 3.  Nasal congestion 4.  Right ear pressure without evidence of otitis media 5.  Headache with chills 6.  2 recent negative home COVID test.  Treatment plan: 1.  The findings and treatment plan were discussed in detail with the patient.  Patient was in agreement. 2.  Given the constellation of symptoms and her past medical history and multiple medications unguinal go ahead and treat her with azithromycin to cover atypicals for her cough.  Also cover the bronchitis that she has. 3.  Given her wheeze I will go ahead and give her a prescription for albuterol. 4.  For her cough I prescribed a cough medicine. 5.  With her nasal congestion I went ahead and added in Flonase. 6.  She can pick up for those medicines up at her pharmacy. 7.  If her symptoms persist she  should see her primary care provider. 8.  Educational handouts provided. 9.  Plenty of rest, plenty fluids, Tylenol or Motrin for any fever or discomfort. 10.  If her symptoms worsen she knows to go to the ER. 11.  She was discharged in stable condition and will follow-up here as needed.    Final Clinical Impressions(s) / UC Diagnoses   Final diagnoses:  Bronchitis  Cough  Viral upper respiratory tract infection  Nasal congestion  Ear pressure, right  Headache due to viral infection     Discharge Instructions      As we discussed, I am treating you for an acute bronchitis with an antibiotic.  This will also cover atypical bacteria that can be causing her cough.  Since you have had 2 negative COVID tests at home we will not retest you.  I have also prescribed an inhaler as you are wheezing.  I also prescribed  a cough medicine.  I also prescribed a nasal steroid to help you with your nasal congestion. If your symptoms persist please see your primary care provider. If they worsen please go to the ER. Please see educational handouts. Please use over-the-counter meds such as Tylenol or Motrin for any fever or discomfort.  Plenty of rest, plenty of fluids.     ED Prescriptions     Medication Sig Dispense Auth. Provider   fluticasone (FLONASE) 50 MCG/ACT nasal spray Place 2 sprays into both nostrils daily. 15.8 mL Verda Cumins, MD   albuterol (VENTOLIN HFA) 108 (90 Base) MCG/ACT inhaler Inhale 1-2 puffs into the lungs every 6 (six) hours as needed for wheezing or shortness of breath. 1 each Verda Cumins, MD   azithromycin (ZITHROMAX) 250 MG tablet Take 1 tablet (250 mg total) by mouth daily. Take first 2 tablets together, then 1 every day until finished. 6 tablet Verda Cumins, MD   promethazine-dextromethorphan (PROMETHAZINE-DM) 6.25-15 MG/5ML syrup Take 5 mLs by mouth 4 (four) times daily as needed for cough. 180 mL Verda Cumins, MD      PDMP not reviewed this  encounter.   Verda Cumins, MD 09/23/20 1348

## 2020-11-01 ENCOUNTER — Ambulatory Visit: Payer: BC Managed Care – PPO | Admitting: Family Medicine

## 2021-01-03 ENCOUNTER — Ambulatory Visit
Admission: RE | Admit: 2021-01-03 | Discharge: 2021-01-03 | Disposition: A | Discharge status: Home / Self Care | Payer: BC Managed Care – PPO | Source: Ambulatory Visit | Attending: Family Medicine | Admitting: Family Medicine

## 2021-01-03 ENCOUNTER — Ambulatory Visit
Admission: RE | Admit: 2021-01-03 | Discharge: 2021-01-03 | Disposition: A | Discharge status: Home / Self Care | Payer: BC Managed Care – PPO | Attending: Family Medicine | Admitting: Family Medicine

## 2021-01-03 ENCOUNTER — Ambulatory Visit (INDEPENDENT_AMBULATORY_CARE_PROVIDER_SITE_OTHER): Payer: BC Managed Care – PPO | Admitting: Family Medicine

## 2021-01-03 ENCOUNTER — Other Ambulatory Visit: Payer: Self-pay

## 2021-01-03 ENCOUNTER — Encounter: Payer: Self-pay | Admitting: Family Medicine

## 2021-01-03 VITALS — BP 127/79 | HR 84 | Resp 16 | Ht 70.0 in | Wt 259.1 lb

## 2021-01-03 DIAGNOSIS — E041 Nontoxic single thyroid nodule: Secondary | ICD-10-CM | POA: Diagnosis not present

## 2021-01-03 DIAGNOSIS — E1159 Type 2 diabetes mellitus with other circulatory complications: Secondary | ICD-10-CM | POA: Diagnosis not present

## 2021-01-03 DIAGNOSIS — E785 Hyperlipidemia, unspecified: Secondary | ICD-10-CM

## 2021-01-03 DIAGNOSIS — E1169 Type 2 diabetes mellitus with other specified complication: Secondary | ICD-10-CM

## 2021-01-03 DIAGNOSIS — M25552 Pain in left hip: Secondary | ICD-10-CM

## 2021-01-03 DIAGNOSIS — I152 Hypertension secondary to endocrine disorders: Secondary | ICD-10-CM

## 2021-01-03 DIAGNOSIS — Z23 Encounter for immunization: Secondary | ICD-10-CM

## 2021-01-03 DIAGNOSIS — E538 Deficiency of other specified B group vitamins: Secondary | ICD-10-CM

## 2021-01-03 DIAGNOSIS — E559 Vitamin D deficiency, unspecified: Secondary | ICD-10-CM | POA: Insufficient documentation

## 2021-01-03 LAB — POCT GLYCOSYLATED HEMOGLOBIN (HGB A1C)
Est. average glucose Bld gHb Est-mCnc: 157
Hemoglobin A1C: 7.1 % — AB (ref 4.0–5.6)

## 2021-01-03 MED ORDER — RYBELSUS 14 MG PO TABS: 14.0000 mg | ORAL_TABLET | Freq: Every day | ORAL | 1 refills | Status: DC

## 2021-01-03 NOTE — Assessment & Plan Note (Signed)
Continue supplement May consider OTC instead of hih dose Recheck level

## 2021-01-03 NOTE — Assessment & Plan Note (Signed)
Recheck level 

## 2021-01-03 NOTE — Assessment & Plan Note (Signed)
Well controlled on external readings Does have some white coat hypertension Continue current medications Recheck metabolic panel F/u in 6 months

## 2021-01-03 NOTE — Assessment & Plan Note (Signed)
Previously f/b Endocrinology Benign biopsy in 2017 F/u US in 2018 showed stability of nodules Recheck TSH

## 2021-01-03 NOTE — Assessment & Plan Note (Signed)
Previously well controlled Continue statin Repeat FLP and CMP  

## 2021-01-03 NOTE — Patient Instructions (Addendum)
CoQ10 100-200 mg daily  Call and schedule eye appointment

## 2021-01-03 NOTE — Assessment & Plan Note (Signed)
New problem x a few months Reports groin pain  TTP over SI joint and greater trocanter Will get XRay and determine next steps Continue meloxicam

## 2021-01-03 NOTE — Assessment & Plan Note (Signed)
Improving Not quite to a1c goal, but has been out of Ozempic x2wks as it is backordered Will try rybelsus in place of ozempic for now Associated with HTN and HLD UTD on screenings and vaccines Advised to schedule eye exam On statin

## 2021-01-03 NOTE — Assessment & Plan Note (Signed)
BMI 37 and assoc with HTN, HLD, T2DM Discussed importance of healthy weight management Discussed diet and exercise

## 2021-01-03 NOTE — Progress Notes (Signed)
Established patient visit   Patient: Bonnie Garcia   DOB: [birth year]   55 y.o. Female  MRN: [id number] Visit Date: 01/03/2021  Today's healthcare provider: Lavon Paganini, MD   Chief Complaint  Patient presents with   Follow-up    Chronic issues.   Subjective    HPI HPI     Follow-up    Additional comments: Chronic issues.      Last edited by Doristine Devoid, CMA on 01/03/2021 11:05 AM.      Diabetes Mellitus Type II, follow-up  Lab Results  Component Value Date   HGBA1C 7.1 (A) 01/03/2021   HGBA1C 7.6 (A) 07/31/2020   HGBA1C 8.6 (A) 04/30/2020   Last seen for diabetes 3 months ago.  Management since then includes changed to Ozempic prescription to help with the pharmacy with this.  She reports poor compliance with treatment. Back order. She is not having side effects.   Home blood sugar records:  not being checked  Episodes of hypoglycemia? No    Most Recent Eye Exam: Past Due  --------------------------------------------------------------------------------------------------- Hypertension, follow-up  BP Readings from Last 3 Encounters:  01/03/21 127/79  09/19/20 (!) 150/86  07/31/20 136/86   Wt Readings from Last 3 Encounters:  01/03/21 259 lb 1.6 oz (117.5 kg)  09/19/20 260 lb (117.9 kg)  07/31/20 270 lb (122.5 kg)     She was last seen for hypertension 3 months ago.  BP at that visit was 136/86. Management since that visit includes patient reports not on any blood pressure medicines. She is not exercising. She is not adherent to low salt diet.    She does not smoke.  --------------------------------------------------------------------------------------------------- Lipid/Cholesterol, follow-up  Last Lipid Panel: Lab Results  Component Value Date   CHOL 183 04/30/2020   LDLCALC 96 04/30/2020   HDL 45 04/30/2020   TRIG 251 (H) 04/30/2020    She was last seen for this 3 months ago.  Management since that visit includes  continue Statin.  She reports excellent compliance with treatment. She is not having side effects.   Symptoms: No appetite changes No foot ulcerations  No chest pain No chest pressure/discomfort  No dyspnea No orthopnea  No fatigue Yes lower extremity edema  No palpitations No paroxysmal nocturnal dyspnea  No nausea No numbness or tingling of extremity  No polydipsia No polyuria  No speech difficulty No syncope   She is following a Regular diet. Current exercise: none  Last metabolic panel Lab Results  Component Value Date   GLUCOSE 214 (H) 04/30/2020   NA 144 04/30/2020   K 4.5 04/30/2020   BUN 5 (L) 04/30/2020   CREATININE 0.68 04/30/2020   EGFR 104 04/30/2020   GFRNONAA 97 06/23/2019   CALCIUM 10.2 04/30/2020   AST 56 (H) 04/30/2020   ALT 30 04/30/2020   The 10-year ASCVD risk score (Arnett DK, et al., 2019) is: 5.1%  ---------------------------------------------------------------------------------------------------  Follow up ER visit  Patient was seen in ER/[place] for right ankle pain on 12/02/2020, reports she just fell and didn't trip on anything. Treatment for this included Rest,Take IBU 400-600 every 8 hours-per patient she didn't take any because she takes Meloxicam. She reports excellent compliance with treatment. She reports this condition is Improved.  -----------------------------------------------------------------------------------------  Reports that she has been having Bilateral Hip pain worsen with the fall.    Medications: Outpatient Medications Prior to Visit  Medication Sig   albuterol (VENTOLIN HFA) 108 (90 Base) MCG/ACT inhaler Inhale 1-2 puffs into  the lungs every 6 (six) hours as needed for wheezing or shortness of breath.   aspirin EC 81 MG tablet Take 81 mg by mouth daily.   citalopram (CELEXA) 40 MG tablet Take 1 tablet (40 mg total) by mouth daily.   cloNIDine (CATAPRES) 0.1 MG tablet Take 0.1 mg by mouth as needed.     Dapagliflozin-metFORMIN HCl ER (XIGDUO XR) 07-998 MG TB24 Take 1 tablet by mouth 2 (two) times daily.   gabapentin (NEURONTIN) 300 MG capsule Take 300 mg by mouth 3 (three) times daily.    glucose blood test strip Use as instructed to test blood sugar 2-3 times daily   Insulin Pen Needle (B-D UF III MINI PEN NEEDLES) 31G X 5 MM MISC Use as directed to inject ozempic weekly   LORazepam (ATIVAN) 1 MG tablet TK 1 T PO BID UTD FOR PANIC SYMPTOMS   meloxicam (MOBIC) 15 MG tablet Take 1 tablet (15 mg total) by mouth daily.   promethazine-dextromethorphan (PROMETHAZINE-DM) 6.25-15 MG/5ML syrup Take 5 mLs by mouth 4 (four) times daily as needed for cough.   rosuvastatin (CRESTOR) 5 MG tablet Take 1 tablet (5 mg total) by mouth daily.   SPRAVATO, 56 MG DOSE, 28 MG/DEVICE SOPK 1 Dose 2 (two) times a week.   traZODone (DESYREL) 150 MG tablet    Vitamin D, Ergocalciferol, (DRISDOL) 1.25 MG (50000 UNIT) CAPS capsule Take 1 capsule (50,000 Units total) by mouth every 7 (seven) days.   zolpidem (AMBIEN) 10 MG tablet Take 10 mg by mouth at bedtime. Rotate with trazodone   [DISCONTINUED] Semaglutide, 2 MG/DOSE, (OZEMPIC, 2 MG/DOSE,) 8 MG/3ML SOPN Inject 2 mg into the skin once a week.   [DISCONTINUED] amphetamine-dextroamphetamine (ADDERALL XR) 30 MG 24 hr capsule Take 30 mg by mouth daily.   [DISCONTINUED] azithromycin (ZITHROMAX) 250 MG tablet Take 1 tablet (250 mg total) by mouth daily. Take first 2 tablets together, then 1 every day until finished.   [DISCONTINUED] fluticasone (FLONASE) 50 MCG/ACT nasal spray Place 2 sprays into both nostrils daily.   No facility-administered medications prior to visit.    Review of Systems  Constitutional: Negative.   Respiratory: Negative.    Cardiovascular: Negative.   Neurological: Negative.    {Labs  Heme  Chem  Endocrine  Serology  Results Review (optional):23779}   Objective    BP 127/79 Comment: home reading when getting [place]rav[place]  Pulse 84   Resp 16    Ht _0  (1.778 m)   Wt 259 lb 1.6 oz (117.5 kg)   SpO2 99%   BMI 37.18 kg/m  {Show previous vital signs (optional):23777}  Physical Exam Vitals reviewed.  Constitutional:      General: She is not in acute distress.    Appearance: Normal appearance. She is well-developed. She is not diaphoretic.  HENT:     Head: Normocephalic and atraumatic.  Eyes:     General: No scleral icterus.    Conjunctiva/sclera: Conjunctivae normal.  Neck:     Thyroid: No thyromegaly.  Cardiovascular:     Rate and Rhythm: Normal rate and regular rhythm.     Pulses: Normal pulses.     Heart sounds: Normal heart sounds. No murmur heard. Pulmonary:     Effort: Pulmonary effort is normal. No respiratory distress.     Breath sounds: Normal breath sounds. No wheezing, rhonchi or rales.  Musculoskeletal:     Cervical back: Neck supple.     Right lower leg: No edema.     Left lower  leg: No edema.     Comments: TTP over L SI joint and greater trocanter. Hip ROM intact  Lymphadenopathy:     Cervical: No cervical adenopathy.  Skin:    General: Skin is warm and dry.     Findings: No rash.  Neurological:     Mental Status: She is alert and oriented to person, place, and time. Mental status is at baseline.  Psychiatric:        Mood and Affect: Mood normal.        Behavior: Behavior normal.     Results for orders placed or performed in visit on 01/03/21  POCT glycosylated hemoglobin (Hb A1C)  Result Value Ref Range   Hemoglobin A1C 7.1 (A) 4.0 - 5.6 %   Est. average glucose Bld gHb Est-mCnc 157     Assessment & Plan     Problem List Items Addressed This Visit       Cardiovascular and Mediastinum   Hypertension associated with diabetes ([place])    Well controlled on external readings Does have some white coat hypertension Continue current medications Recheck metabolic panel F/u in 6 months       Relevant Medications   Semaglutide (RYBELSUS) 14 MG TABS     Endocrine   Diabetes mellitus ([place]) -  Primary    Improving Not quite to a1c goal, but has been out of Ozempic x2wks as it is backordered Will try rybelsus in place of ozempic for now Associated with HTN and HLD UTD on screenings and vaccines Advised to schedule eye exam On statin      Relevant Medications   Semaglutide (RYBELSUS) 14 MG TABS   Other Relevant Orders   Comprehensive metabolic panel   POCT glycosylated hemoglobin (Hb A1C) (Completed)   Thyroid nodule    Previously f/b Endocrinology Benign biopsy in 2017 F/u US in 2018 showed stability of nodules Recheck TSH      Hyperlipidemia associated with type 2 diabetes mellitus ([place])    Previously well controlled Continue statin Repeat FLP and CMP      Relevant Medications   Semaglutide (RYBELSUS) 14 MG TABS   Other Relevant Orders   Lipid panel     Other   B12 deficiency    Recheck level      Morbid obesity ([place])    BMI 37 and assoc with HTN, HLD, T2DM Discussed importance of healthy weight management Discussed diet and exercise       Relevant Medications   Semaglutide (RYBELSUS) 14 MG TABS   Avitaminosis D    Continue supplement May consider OTC instead of hih dose Recheck level      Left hip pain    New problem x a few months Reports groin pain  TTP over SI joint and greater trocanter Will get XRay and determine next steps Continue meloxicam      Relevant Orders   DG Hip Unilat W OR W/O Pelvis 2-3 Views Left   Other Visit Diagnoses     Need for shingles vaccine       Relevant Orders   Varicella-zoster vaccine IM (Shingrix) (Completed)        Return in about 6 months (around 07/03/2021) for CPE.      I, Lavon Paganini, MD, have reviewed all documentation for this visit. The documentation on 01/03/21 for the exam, diagnosis, procedures, and orders are all accurate and complete.   Shawneen Deetz, Dionne Bucy, MD, MPH [facility]

## 2021-01-04 ENCOUNTER — Telehealth: Payer: Self-pay | Admitting: Family Medicine

## 2021-01-04 NOTE — Telephone Encounter (Signed)
Rx was sent to pharmacy yesterday

## 2021-01-04 NOTE — Telephone Encounter (Signed)
[facility] faxed refill request for the following medications:   Semaglutide, 1 MG/DOSE, (OZEMPIC, 1 MG/DOSE,) 2 MG/1.5ML SOPN   Please advise.

## 2021-01-10 ENCOUNTER — Ambulatory Visit: Payer: BC Managed Care – PPO | Admitting: Family Medicine

## 2021-01-14 ENCOUNTER — Telehealth: Payer: Self-pay | Admitting: Family Medicine

## 2021-01-14 MED ORDER — XIGDUO XR 5-1000 MG PO TB24: 1.0000 | ORAL_TABLET | Freq: Two times a day (BID) | ORAL | 1 refills | Status: DC

## 2021-01-14 NOTE — Telephone Encounter (Signed)
[facility] faxed refill request for the following medications:  Dapagliflozin-metFORMIN HCl ER (XIGDUO XR) 07-998 MG TB24   Please advise.

## 2021-01-20 IMAGING — MG DIGITAL SCREENING BILAT W/ TOMO W/ CAD
8 series · 8 of 24 positions shown · non-contrast
Comparison: Previous exam(s).

CLINICAL DATA: Screening.

EXAM:
DIGITAL SCREENING BILATERAL MAMMOGRAM WITH TOMO AND CAD

[L CC synth-2D]
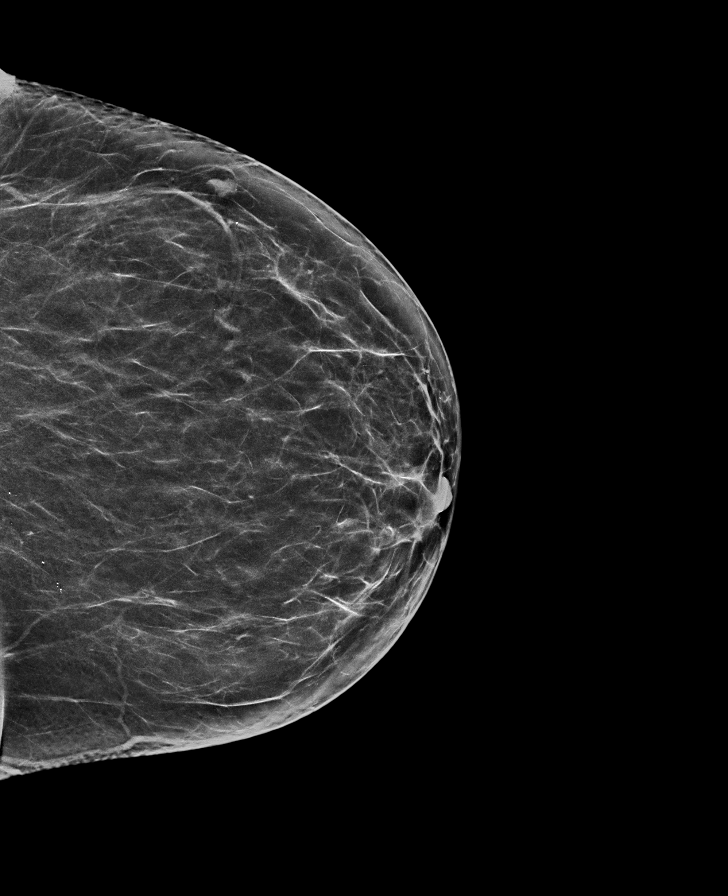

[L MLO synth-2D]
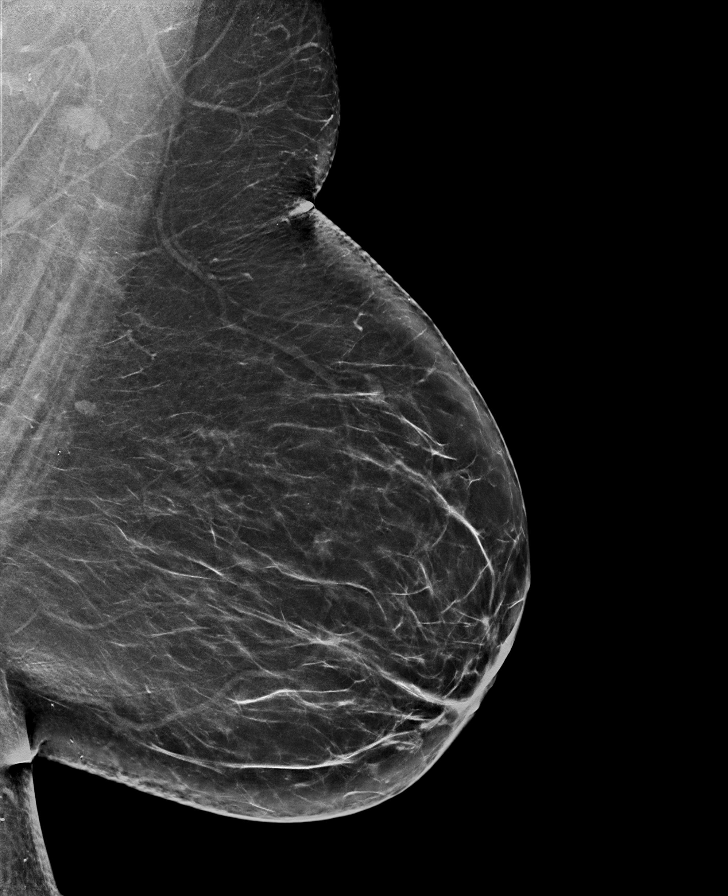

[R MLO synth-2D]
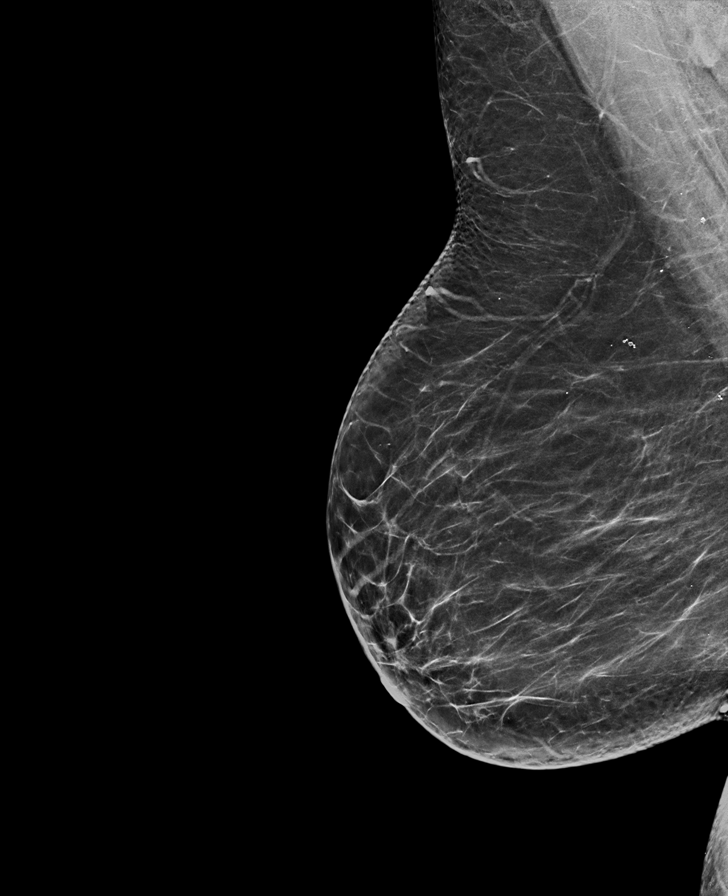

[R CC synth-2D]
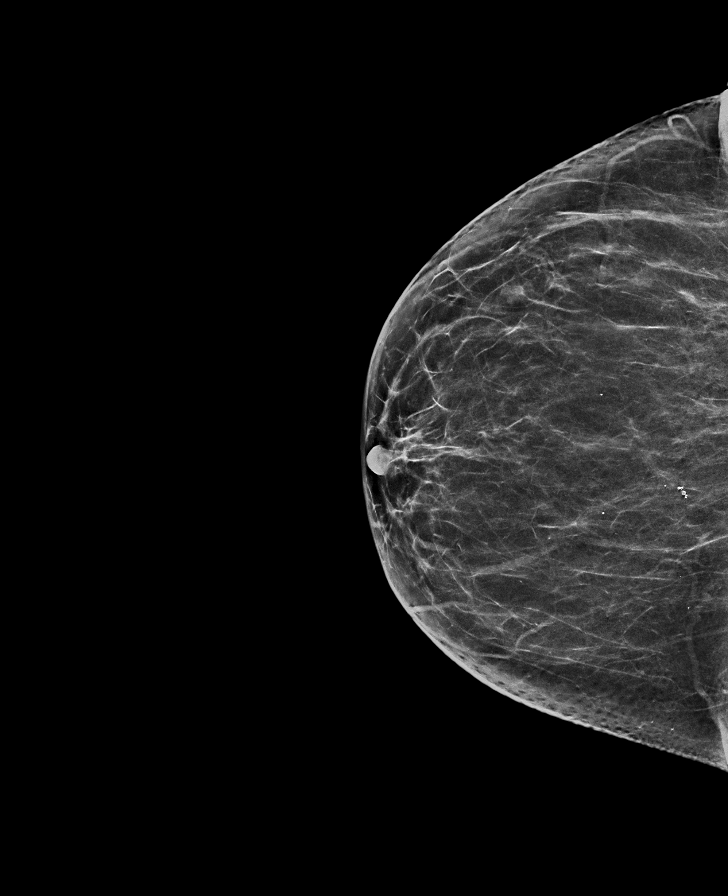

[R MLO tomo · tomo slice 38/75.0]
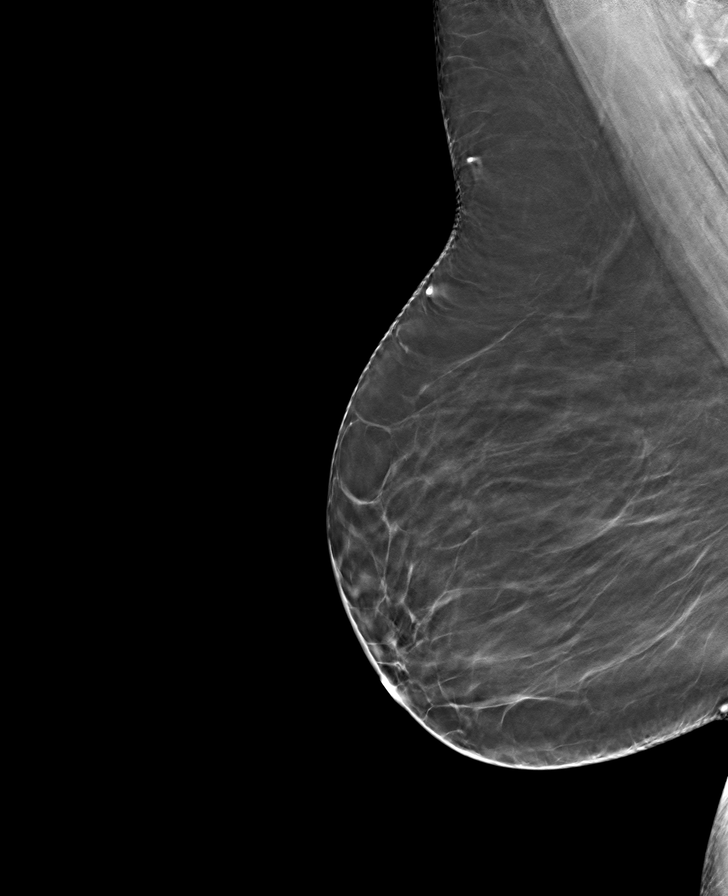

[L MLO tomo · tomo slice 43/84.0]
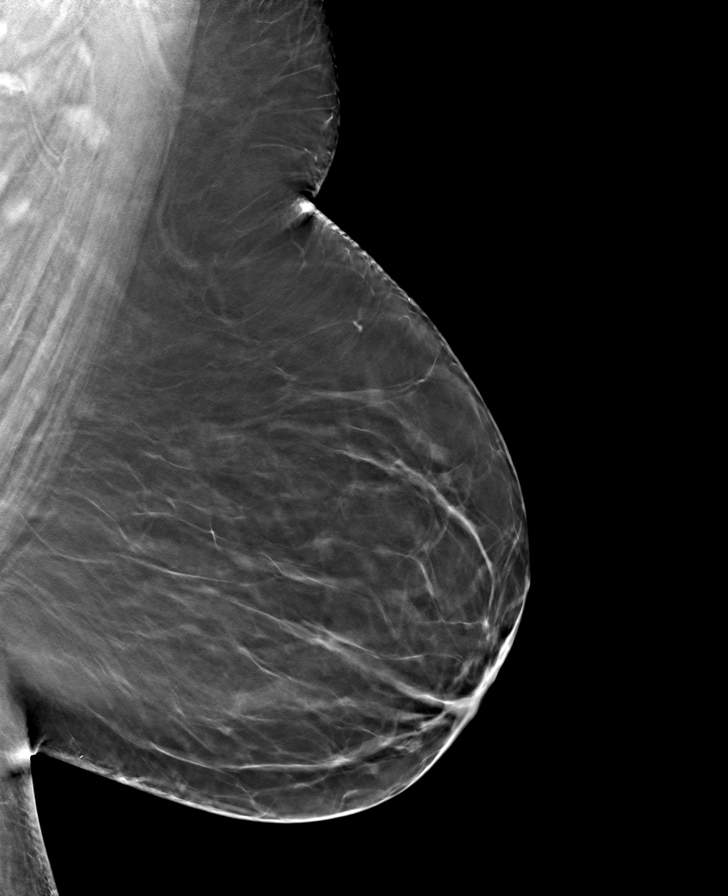

[R CC tomo · tomo slice 33/65.0]
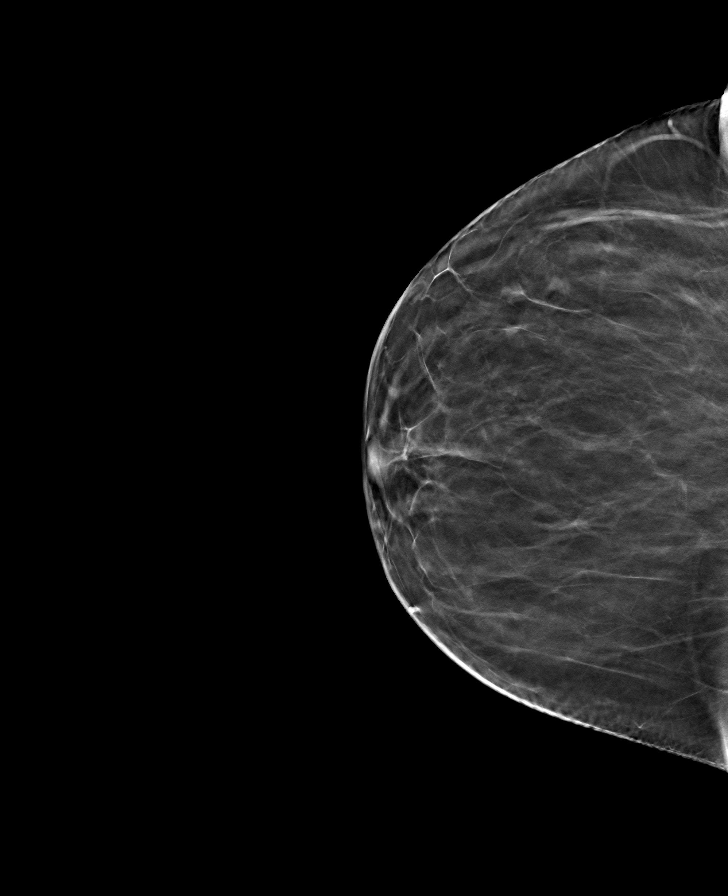

[L CC tomo · tomo slice 35/69.0]
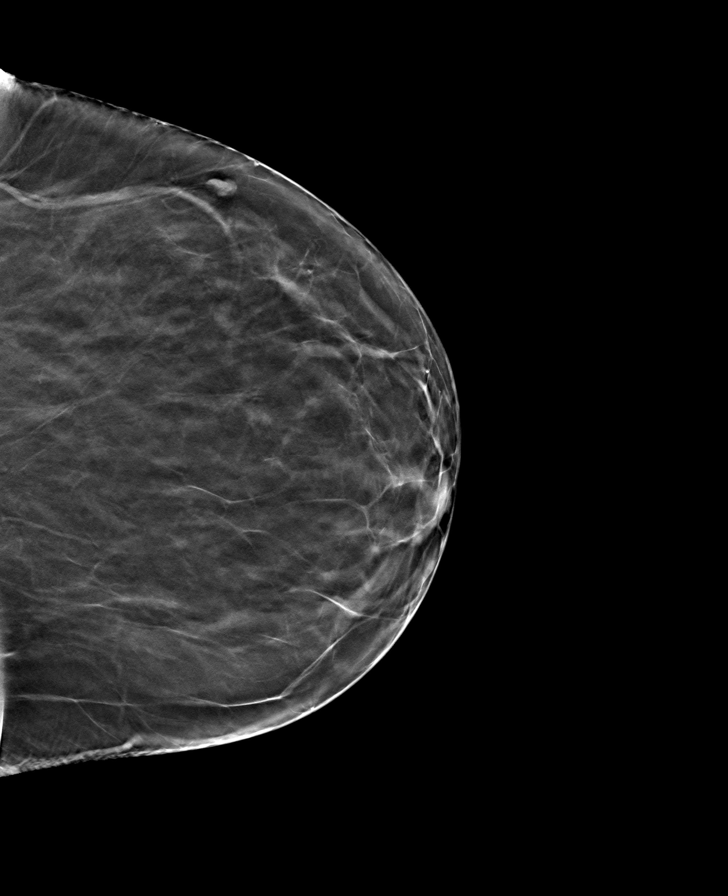

[8 of 24 positions shown; findings below may reference images not displayed]

ACR Breast Density Category b: There are scattered areas of
fibroglandular density.
FINDINGS: There are no findings suspicious for malignancy. Images were
processed with CAD.
IMPRESSION: No mammographic evidence of malignancy. A result letter of this
screening mammogram will be mailed directly to the patient.

RECOMMENDATION:
Screening mammogram in one year. (Code:[id number]-775)

BI-RADS CATEGORY  1: Negative.

## 2021-02-07 ENCOUNTER — Telehealth: Payer: Self-pay

## 2021-02-07 NOTE — Telephone Encounter (Signed)
PA for Ozempic done waiting on response from insurance.

## 2021-02-07 NOTE — Telephone Encounter (Signed)
Left message for patient requesting call back regarding Ozempic. We received a PA request for Ozempic, however patient was started on Rybelsus at last ov.

## 2021-02-07 NOTE — Telephone Encounter (Signed)
Patient returned Suli's call. I patient of message below. Patient states that the Rybelsus was given to her during that last visit in case the Ozempic was not available at the pharmacy. Patient says she never started the Rybelsus because the Ozempic was in stock at the pharmacy, but requires a prior authorization.

## 2021-02-11 ENCOUNTER — Telehealth: Payer: Self-pay | Admitting: Family Medicine

## 2021-02-11 MED ORDER — CITALOPRAM HYDROBROMIDE 40 MG PO TABS: 40.0000 mg | ORAL_TABLET | Freq: Every day | ORAL | 1 refills | Status: DC

## 2021-02-11 NOTE — Telephone Encounter (Signed)
[facility] faxed refill request for the following medications:  citalopram (CELEXA) 40 MG tablet   Please advise.  

## 2021-02-26 ENCOUNTER — Telehealth: Payer: Self-pay | Admitting: Family Medicine

## 2021-02-26 ENCOUNTER — Other Ambulatory Visit: Payer: Self-pay

## 2021-02-26 NOTE — Telephone Encounter (Signed)
[facility] faxed refill request for the following medications:  Ozemoic 1mg  per dose 1x4mg  pen    Please advise.

## 2021-02-26 NOTE — Telephone Encounter (Signed)
Not on current medication list.   

## 2021-03-13 ENCOUNTER — Telehealth: Payer: Self-pay

## 2021-03-13 ENCOUNTER — Telehealth: Payer: Self-pay | Admitting: Family Medicine

## 2021-03-13 NOTE — Telephone Encounter (Signed)
Copied from [place] [id number]. Topic: General - Other >> Mar 13, 2021 12:24 PM Bonnie Garcia A wrote: Reason for CRM: The patient has called to share that they have not taken any of their Semaglutide (RYBELSUS) 14 MG TABS [[id number]]   The patient declined to take the medication due to a family member becoming very sick from it previously  Please contact the patient further if needed

## 2021-03-13 NOTE — Telephone Encounter (Signed)
See other phone note. This is a duplicate

## 2021-03-13 NOTE — Telephone Encounter (Signed)
Copied from [place] [id number]. Topic: Quick Communication - Rx Refill/Question >> Mar 13, 2021 12:22 PM Tessa Lerner A wrote: Medication: Semaglutide, 2 MG/DOSE, (OZEMPIC, 2 MG/DOSE,) 8 MG/3ML SOPN [[id number]]   Has the patient contacted their pharmacy? Yes.  The patient has been directed to contact their PCP (Agent: If no, request that the patient contact the pharmacy for the refill. If patient does not wish to contact the pharmacy document the reason why and proceed with request.) (Agent: If yes, when and what did the pharmacy advise?)  Preferred Pharmacy (with phone number or street name): [id number] [place], [place]  Phone:  [phone] Fax:  [phone]  Has the patient been seen for an appointment in the last year OR does the patient have an upcoming appointment? Yes.    Agent: Please be advised that RX refills may take up to 3 business days. We ask that you follow-up with your pharmacy.

## 2021-03-13 NOTE — Telephone Encounter (Signed)
Ok to change back to previous dose of ozempic. Please send rx. As a note, as she was tolerating ozempic, she would have likely tolerated rybelsus well. It's the same need, just in oral form.

## 2021-03-13 NOTE — Telephone Encounter (Signed)
Patient called and advised Ozempic was changed to Rybelsus on 01/03/21. She says she picked it up but never took it because her sister said it made her sick. She says she wants to continue using the Ozempic injection and [facility]. I advised I will send this to Dr. Brita Romp.

## 2021-03-14 MED ORDER — SEMAGLUTIDE (2 MG/DOSE) 8 MG/3ML ~~LOC~~ SOPN: 2.0000 mg | PEN_INJECTOR | SUBCUTANEOUS | 3 refills | Status: DC

## 2021-03-14 NOTE — Telephone Encounter (Signed)
Rx sent in. Patient aware

## 2021-05-03 ENCOUNTER — Telehealth: Payer: Self-pay | Admitting: Family Medicine

## 2021-05-03 DIAGNOSIS — M545 Low back pain, unspecified: Secondary | ICD-10-CM

## 2021-05-03 MED ORDER — MELOXICAM 15 MG PO TABS: 15.0000 mg | ORAL_TABLET | Freq: Every day | ORAL | 3 refills | Status: DC

## 2021-05-03 NOTE — Telephone Encounter (Signed)
[facility] fax[facility] refill request for the following medications:  meloxicam (MOBIC) 15 MG tablet   Please advise.  

## 2021-06-14 NOTE — Progress Notes (Signed)
?  ? ?I,Sulibeya S Dimas,acting as a scribe for Lavon Paganini, MD.,have documented all relevant documentation on the behalf of Lavon Paganini, MD,as directed by  Lavon Paganini, MD while in the presence of Lavon Paganini, MD. ? ? ?Established patient visit ? ? ?Patient: Bonnie Garcia   DOB: [birth year]   56 y.o. Female  MRN: [id number] ?Visit Date: 06/17/2021 ? ?Today's healthcare provider: Lavon Paganini, MD  ? ?Chief Complaint  ?Patient presents with  ? Shoulder Pain  ? ?Subjective  ?  ?Shoulder Pain  ?The pain is present in the right shoulder. This is a new problem. Episode onset: months. The problem occurs constantly. The problem has been gradually worsening. The quality of the pain is described as sharp. The pain is severe. Associated symptoms include an inability to bear weight, joint swelling, a limited range of motion, numbness, stiffness and tingling. Pertinent negatives include no fever or joint locking. The symptoms are aggravated by activity and lying down. She has tried OTC ointments and OTC pain meds for the symptoms. The treatment provided mild relief. Her past medical history is significant for diabetes.   ? ? ?Has been lifting her grandmother over the last 6 months ?Pain in b/l shoulders x43m?R>L ?Tried biofreeze, ibuprofen (stopped meloxicam) ?Some mornings difficult to fasten bra and brush her hair ? ?Medications: ?Outpatient Medications Prior to Visit  ?Medication Sig  ? albuterol (VENTOLIN HFA) 108 (90 Base) MCG/ACT inhaler Inhale 1-2 puffs into the lungs every 6 (six) hours as needed for wheezing or shortness of breath.  ? aspirin EC 81 MG tablet Take 81 mg by mouth daily.  ? citalopram (CELEXA) 40 MG tablet Take 1 tablet (40 mg total) by mouth daily.  ? cloNIDine (CATAPRES) 0.1 MG tablet Take 0.1 mg by mouth as needed.   ? Dapagliflozin-metFORMIN HCl ER (XIGDUO XR) 07-998 MG TB24 Take 1 tablet by mouth 2 (two) times daily.  ? gabapentin (NEURONTIN) 300 MG capsule Take 300  mg by mouth 3 (three) times daily.   ? glucose blood test strip Use as instructed to test blood sugar 2-3 times daily  ? Insulin Pen Needle (B-D UF III MINI PEN NEEDLES) 31G X 5 MM MISC Use as directed to inject ozempic weekly  ? LORazepam (ATIVAN) 1 MG tablet TK 1 T PO BID UTD FOR PANIC SYMPTOMS  ? meloxicam (MOBIC) 15 MG tablet Take 1 tablet (15 mg total) by mouth daily.  ? promethazine-dextromethorphan (PROMETHAZINE-DM) 6.25-15 MG/5ML syrup Take 5 mLs by mouth 4 (four) times daily as needed for cough.  ? rosuvastatin (CRESTOR) 5 MG tablet Take 1 tablet (5 mg total) by mouth daily.  ? Semaglutide, 2 MG/DOSE, 8 MG/3ML SOPN Inject 2 mg as directed once a week.  ? SPRAVATO, 56 MG DOSE, 28 MG/DEVICE SOPK 1 Dose 2 (two) times a week.  ? traZODone (DESYREL) 150 MG tablet   ? Vitamin D, Ergocalciferol, (DRISDOL) 1.25 MG (50000 UNIT) CAPS capsule Take 1 capsule (50,000 Units total) by mouth every 7 (seven) days.  ? zolpidem (AMBIEN) 10 MG tablet Take 10 mg by mouth at bedtime. Rotate with trazodone  ? ?No facility-administered medications prior to visit.  ? ? ?Review of Systems  ?Constitutional:  Negative for appetite change, chills and fever.  ?Respiratory:  Negative for chest tightness and shortness of breath.   ?Cardiovascular:  Negative for chest pain and palpitations.  ?Musculoskeletal:  Positive for arthralgias, joint swelling, myalgias and stiffness.  ?Neurological:  Positive for tingling and numbness.  ? ? ?  Objective  ?  ?BP (!) 152/90 (BP Location: Left Arm, Patient Position: Sitting, Cuff Size: Large)   Pulse 94   Temp 98 ?F (36.7 ?C) (Oral)   Resp 16   Wt 264 lb 6.4 oz (119.9 kg)   SpO2 99%   BMI 37.94 kg/m?  ? ? ?Physical Exam ?Vitals reviewed.  ?Constitutional:   ?   General: She is not in acute distress. ?   Appearance: Normal appearance. She is well-developed. She is not diaphoretic.  ?HENT:  ?   Head: Normocephalic and atraumatic.  ?Eyes:  ?   General: No scleral icterus. ?   Conjunctiva/sclera:  Conjunctivae normal.  ?Neck:  ?   Thyroid: No thyromegaly.  ?Cardiovascular:  ?   Rate and Rhythm: Normal rate and regular rhythm.  ?   Pulses: Normal pulses.  ?   Heart sounds: Normal heart sounds. No murmur heard. ?Pulmonary:  ?   Effort: Pulmonary effort is normal. No respiratory distress.  ?   Breath sounds: Normal breath sounds. No wheezing, rhonchi or rales.  ?Musculoskeletal:  ?   Cervical back: Neck supple.  ?   Comments: R Shoulder: ?Inspection reveals no abnormalities, atrophy or asymmetry. ?Palpation is normal with no tenderness over AC joint or bicipital groove. ?ROM is very limited in abduction, flexion, internal rotation. ?Rotator cuff strength limited and painful in internal and external rotation. ?Unable to test for impingement due to pain and limited abduction  ?Lymphadenopathy:  ?   Cervical: No cervical adenopathy.  ?Skin: ?   General: Skin is warm and dry.  ?   Findings: No rash.  ?Neurological:  ?   Mental Status: She is alert and oriented to person, place, and time. Mental status is at baseline.  ?Psychiatric:     ?   Mood and Affect: Mood normal.     ?   Behavior: Behavior normal.  ?  ? ? ?No results found for any visits on 06/17/21. ? Assessment & Plan  ?  ? ?Problem List Items Addressed This Visit   ? ?  ? Endocrine  ? Diabetes mellitus ([place]) - Primary  ?  Upcoming f/u appt in 2 weeks, but will go ahead and check A1c, CMP, lipids, uACR today and review at that appt ? ?  ?  ? Relevant Orders  ? Microalbumin / creatinine urine ratio  ? Hemoglobin A1c  ?  ? Musculoskeletal and Integument  ? Adhesive capsulitis of right shoulder  ?  New problem with subacute pain x80mafter lifting injury ?Concern for rotator cuff pathology, but also with frozen R shoulder with very limited abduction and flexion ?Will start PT locally ?Referral to orthopedics placed-[place] brother [place] ?Continue  ibuprofen and Biofreeze ?Ice regularly ?Given that the orthopedist that we are referring to is outside of our network, we will hold off on x-ray as he may not be able to see the results and is likely due on in his office anyway ? ?  ?  ? Relevant Orders  ? Ambulatory referral to Orthopedic Surgery  ? Ambulatory referral to Physical Therapy  ?  ? Other  ? Acute pain of right shoulder  ?  As above, subacute right shoulder pain x6 months ?Very limited range of motion and limited rotator cuff strength on exam today, which is concerning for rotator cuff injury ?Will refer to PT and Ortho as above ?Continue  Biofreeze and ibuprofen ?Encourage ice and rest ?Avoid lifting ? ?  ?  ? Relevant Orders  ? Ambulatory referral to Orthopedic Surgery  ? Ambulatory referral to Physical Therapy  ? ?Other Visit Diagnoses   ? ? Leukocytosis, unspecified type      ? Relevant Orders  ? CBC w/Diff/Platelet  ? ?  ?  ? ?Return in about 3 weeks (around 07/08/2021) for as scheduled.  ?   ? ?I, Lavon Paganini, MD, have reviewed all documentation for this visit. The documentation on 06/17/21 for the exam, diagnosis, procedures, and orders are all accurate and complete. ? ? ?Virginia Crews, MD, MPH ?[place] [facility]   ?

## 2021-06-17 ENCOUNTER — Ambulatory Visit (INDEPENDENT_AMBULATORY_CARE_PROVIDER_SITE_OTHER): Payer: BC Managed Care – PPO | Admitting: Family Medicine

## 2021-06-17 ENCOUNTER — Encounter: Payer: Self-pay | Admitting: Family Medicine

## 2021-06-17 VITALS — BP 152/90 | HR 94 | Temp 98.0°F | Resp 16 | Wt 264.4 lb

## 2021-06-17 DIAGNOSIS — E1169 Type 2 diabetes mellitus with other specified complication: Secondary | ICD-10-CM

## 2021-06-17 DIAGNOSIS — D72829 Elevated white blood cell count, unspecified: Secondary | ICD-10-CM

## 2021-06-17 DIAGNOSIS — M25511 Pain in right shoulder: Secondary | ICD-10-CM | POA: Insufficient documentation

## 2021-06-17 DIAGNOSIS — M7501 Adhesive capsulitis of right shoulder: Secondary | ICD-10-CM

## 2021-06-17 DIAGNOSIS — Z794 Long term (current) use of insulin: Secondary | ICD-10-CM

## 2021-06-17 NOTE — Assessment & Plan Note (Signed)
Upcoming f/u appt in 2 weeks, but will go ahead and check A1c, CMP, lipids, uACR today and review at that appt ?

## 2021-06-17 NOTE — Assessment & Plan Note (Signed)
As above, subacute right shoulder pain x6 months ?Very limited range of motion and limited rotator cuff strength on exam today, which is concerning for rotator cuff injury ?Will refer to PT and Ortho as above ?Continue Biofreeze and ibuprofen ?Encourage ice and rest ?Avoid lifting ?

## 2021-06-17 NOTE — Assessment & Plan Note (Signed)
New problem with subacute pain x63mafter lifting injury ?Concern for rotator cuff pathology, but also with frozen R shoulder with very limited abduction and flexion ?Will start PT locally ?Referral to [place]opedics placed[place] and her brother [place] ?Continue ibuprofen and Biofreeze ?Ice regularly ?Given that the orthopedist that we are referring to is outside of our network, we will hold off on x-ray as he may not be able to see the results and is likely due on in his office anyway ?

## 2021-06-18 LAB — CBC WITH DIFFERENTIAL/PLATELET
Basophils Absolute: 0.1 10*3/uL (ref 0.0–0.2)
Basos: 1 %
EOS (ABSOLUTE): 0.3 10*3/uL (ref 0.0–0.4)
Eos: 3 %
Hematocrit: 46 % (ref 34.0–46.6)
Hemoglobin: 15.9 g/dL (ref 11.1–15.9)
Immature Grans (Abs): 0 10*3/uL (ref 0.0–0.1)
Immature Granulocytes: 0 %
Lymphocytes Absolute: 3.4 10*3/uL — ABNORMAL HIGH (ref 0.7–3.1)
Lymphs: 38 %
MCH: 30.2 pg (ref 26.6–33.0)
MCHC: 34.6 g/dL (ref 31.5–35.7)
MCV: 88 fL (ref 79–97)
Monocytes Absolute: 0.5 10*3/uL (ref 0.1–0.9)
Monocytes: 6 %
Neutrophils Absolute: 4.7 10*3/uL (ref 1.4–7.0)
Neutrophils: 52 %
Platelets: 433 10*3/uL (ref 150–450)
RBC: 5.26 x10E6/uL (ref 3.77–5.28)
RDW: 12.3 % (ref 11.7–15.4)
WBC: 9.1 10*3/uL (ref 3.4–10.8)

## 2021-06-18 LAB — COMPREHENSIVE METABOLIC PANEL
ALT: 29 IU/L (ref 0–32)
AST: 33 IU/L (ref 0–40)
Albumin/Globulin Ratio: 2 (ref 1.2–2.2)
Albumin: 4.7 g/dL (ref 3.8–4.9)
Alkaline Phosphatase: 58 IU/L (ref 44–121)
BUN/Creatinine Ratio: 10 (ref 9–23)
BUN: 7 mg/dL (ref 6–24)
Bilirubin Total: 0.3 mg/dL (ref 0.0–1.2)
CO2: 23 mmol/L (ref 20–29)
Calcium: 10.1 mg/dL (ref 8.7–10.2)
Chloride: 97 mmol/L (ref 96–106)
Creatinine, Ser: 0.69 mg/dL (ref 0.57–1.00)
Globulin, Total: 2.3 g/dL (ref 1.5–4.5)
Glucose: 214 mg/dL — ABNORMAL HIGH (ref 70–99)
Potassium: 5.3 mmol/L — ABNORMAL HIGH (ref 3.5–5.2)
Sodium: 139 mmol/L (ref 134–144)
Total Protein: 7 g/dL (ref 6.0–8.5)
eGFR: 103 mL/min/{1.73_m2} (ref >59)

## 2021-06-18 LAB — LIPID PANEL
Chol/HDL Ratio: 4 ratio (ref 0.0–4.4)
Cholesterol, Total: 182 mg/dL (ref 100–199)
HDL: 46 mg/dL (ref >39)
LDL Chol Calc (NIH): 93 mg/dL (ref 0–99)
Triglycerides: 259 mg/dL — ABNORMAL HIGH (ref 0–149)
VLDL Cholesterol Cal: 43 mg/dL — ABNORMAL HIGH (ref 5–40)

## 2021-06-18 LAB — MICROALBUMIN / CREATININE URINE RATIO
Creatinine, Urine: 40.5 mg/dL
Microalb/Creat Ratio: 11 mg/g creat (ref 0–29)
Microalbumin, Urine: 4.3 ug/mL

## 2021-06-18 LAB — HEMOGLOBIN A1C
Est. average glucose Bld gHb Est-mCnc: 163 mg/dL
Hgb A1c MFr Bld: 7.3 % — ABNORMAL HIGH (ref 4.8–5.6)

## 2021-06-24 ENCOUNTER — Telehealth: Payer: Self-pay

## 2021-06-24 ENCOUNTER — Other Ambulatory Visit: Payer: Self-pay | Admitting: Family Medicine

## 2021-06-24 DIAGNOSIS — M25511 Pain in right shoulder: Secondary | ICD-10-CM

## 2021-06-24 NOTE — Telephone Encounter (Signed)
Please Review for Dr. B 

## 2021-06-24 NOTE — Telephone Encounter (Signed)
Patient aware and agreed to xrays. ?

## 2021-06-24 NOTE — Telephone Encounter (Signed)
Reason for CRM: Pt called in stating she was referred to Dr. Waynetta Sandy about her shoulder, pt states she has her appt on 05/03, and they told her it would be great if she could get a MRI done on her shoulder before coming in to their office, so he can see that. Pt requested a call back, to see about that order, please advise. ?

## 2021-06-25 ENCOUNTER — Ambulatory Visit
Admission: RE | Admit: 2021-06-25 | Discharge: 2021-06-25 | Disposition: A | Discharge status: Home / Self Care | Payer: BC Managed Care – PPO | Attending: Family Medicine | Admitting: Family Medicine

## 2021-06-25 ENCOUNTER — Ambulatory Visit
Admission: RE | Admit: 2021-06-25 | Discharge: 2021-06-25 | Disposition: A | Discharge status: Home / Self Care | Payer: BC Managed Care – PPO | Source: Ambulatory Visit | Attending: Family Medicine | Admitting: Family Medicine

## 2021-06-25 DIAGNOSIS — M25511 Pain in right shoulder: Secondary | ICD-10-CM | POA: Insufficient documentation

## 2021-06-26 ENCOUNTER — Telehealth: Payer: Self-pay | Admitting: Family Medicine

## 2021-06-26 DIAGNOSIS — M25511 Pain in right shoulder: Secondary | ICD-10-CM

## 2021-06-26 NOTE — Telephone Encounter (Signed)
Patient would like MRI [facility] as soon as possible. Patient has a orthopedic appointment scheduled for 07/02/2021. Patient would like MRI done prior to specialist appointment  ?

## 2021-06-27 ENCOUNTER — Ambulatory Visit
Admission: RE | Admit: 2021-06-27 | Discharge: 2021-06-27 | Disposition: A | Discharge status: Home / Self Care | Payer: BC Managed Care – PPO | Source: Ambulatory Visit | Attending: Family Medicine | Admitting: Family Medicine

## 2021-06-27 DIAGNOSIS — M25511 Pain in right shoulder: Secondary | ICD-10-CM

## 2021-06-27 NOTE — Telephone Encounter (Signed)
Patient advised that order for MRI was placed. Referral coordinator will call her for her appointment time and date after she gets the authorization from patient's insurance. ?

## 2021-07-01 ENCOUNTER — Telehealth: Payer: Self-pay

## 2021-07-01 NOTE — Telephone Encounter (Signed)
We'll make sure that she gets refills as needed to get her to rescheduled appt.  We can check labs as needed at the rescheduled time too. ?

## 2021-07-02 ENCOUNTER — Other Ambulatory Visit: Payer: Self-pay | Admitting: Family Medicine

## 2021-07-02 NOTE — Telephone Encounter (Signed)
A my chart message was sent to patient with providers message. ?

## 2021-07-03 ENCOUNTER — Other Ambulatory Visit: Payer: Self-pay | Admitting: Family Medicine

## 2021-07-04 ENCOUNTER — Telehealth: Payer: Self-pay | Admitting: Family Medicine

## 2021-07-04 MED ORDER — ROSUVASTATIN CALCIUM 5 MG PO TABS: 5.0000 mg | ORAL_TABLET | Freq: Every day | ORAL | 1 refills | Status: DC

## 2021-07-04 NOTE — Telephone Encounter (Signed)
[facility] pharmacy faxed refill request for the following medications:  rosuvastatin (CRESTOR) 5 MG tablet    Please advise  

## 2021-07-08 ENCOUNTER — Encounter: Payer: BC Managed Care – PPO | Admitting: Family Medicine

## 2021-07-23 ENCOUNTER — Other Ambulatory Visit: Payer: Self-pay | Admitting: Family Medicine

## 2021-10-17 ENCOUNTER — Other Ambulatory Visit: Payer: Self-pay | Admitting: Family Medicine

## 2021-10-17 DIAGNOSIS — M545 Low back pain, unspecified: Secondary | ICD-10-CM

## 2021-10-25 ENCOUNTER — Telehealth: Payer: Self-pay | Admitting: Family Medicine

## 2021-10-25 DIAGNOSIS — E1169 Type 2 diabetes mellitus with other specified complication: Secondary | ICD-10-CM

## 2021-10-25 MED ORDER — GLUCOSE BLOOD VI STRP: ORAL_STRIP | 12 refills | Status: AC

## 2021-10-25 MED ORDER — ACCU-CHEK AVIVA PLUS W/DEVICE KIT: 1.0000 | PACK | Freq: Three times a day (TID) | 0 refills | Status: AC

## 2021-10-25 NOTE — Telephone Encounter (Signed)
Pt is calling to request a new glucometer and test strips. Please advise Pharmacy- [facility]- [phone]

## 2021-10-25 NOTE — Telephone Encounter (Signed)
They were ordered and patient was notified.

## 2021-10-31 ENCOUNTER — Encounter: Payer: BC Managed Care – PPO | Admitting: Family Medicine

## 2021-11-05 NOTE — Progress Notes (Signed)
I,Sulibeya S Dimas,acting as a Education administrator for Bonnie Sprout, FNP.,have documented all relevant documentation on the behalf of Bonnie Sprout, FNP,as directed by  Bonnie Sprout, FNP while in the presence of Bonnie Sprout, FNP.  Established patient visit  Patient: Bonnie Garcia   DOB: [birth year]   56 y.o. Female  MRN: [id number] Visit Date: 11/07/2021  Today's healthcare provider: [name], FNP  Introduced to nurse practitioner role and practice setting.  All questions answered.  Discussed provider/patient relationship and expectations.  Chief Complaint  Patient presents with   Wound Infection   Diabetes   Subjective    HPI  Diabetes Mellitus Type II, Follow-up  Lab Results  Component Value Date   HGBA1C 6.8 (A) 11/07/2021   HGBA1C 7.3 (H) 06/17/2021   HGBA1C 7.1 (A) 01/03/2021   Wt Readings from Last 3 Encounters:  11/07/21 257 lb (116.6 kg)  06/17/21 264 lb 6.4 oz (119.9 kg)  01/03/21 259 lb 1.6 oz (117.5 kg)   Last seen for diabetes 5 months ago.  Management since then includes no changes. She reports excellent compliance with treatment. She is not having side effects.  Symptoms: Yes fatigue Yes foot ulcerations  No appetite changes No nausea  Yes paresthesia of the feet  No polydipsia  No polyuria No visual disturbances   No vomiting     Home blood sugar records:  not being checked.  Episodes of hypoglycemia? No    Current insulin regiment: none Most Recent Eye Exam:  Current exercise: none Current diet habits: in general, a "healthy" diet    Pertinent Labs: Lab Results  Component Value Date   CHOL 182 06/17/2021   HDL 46 06/17/2021   LDLCALC 93 06/17/2021   TRIG 259 (H) 06/17/2021   CHOLHDL 4.0 06/17/2021   Lab Results  Component Value Date   NA 139 06/17/2021   K 5.3 (H) 06/17/2021   CREATININE 0.69 06/17/2021   EGFR 103 06/17/2021   MICROALBUR 20 10/21/2017   LABMICR 4.3 06/17/2021      ---------------------------------------------------------------------------------------------------  Medications: Outpatient Medications Prior to Visit  Medication Sig   albuterol (VENTOLIN HFA) 108 (90 Base) MCG/ACT inhaler Inhale 1-2 puffs into the lungs every 6 (six) hours as needed for wheezing or shortness of breath.   aspirin EC 81 MG tablet Take 81 mg by mouth daily.   Blood Glucose Monitoring Suppl (ACCU-CHEK AVIVA PLUS) w/Device KIT 1 Device by Does not apply route 3 (three) times daily.   gabapentin (NEURONTIN) 300 MG capsule Take 300 mg by mouth 3 (three) times daily.    glucose blood test strip Use as instructed to test blood sugar 2-3 times daily   Insulin Pen Needle (B-D UF III MINI PEN NEEDLES) 31G X 5 MM MISC Use as directed to inject ozempic weekly   LORazepam (ATIVAN) 1 MG tablet TK 1 T PO BID UTD FOR PANIC SYMPTOMS   rosuvastatin (CRESTOR) 5 MG tablet Take 1 tablet (5 mg total) by mouth daily.   SPRAVATO, 56 MG DOSE, 28 MG/DEVICE SOPK 1 Dose 2 (two) times a week.   traZODone (DESYREL) 150 MG tablet    Vitamin D, Ergocalciferol, (DRISDOL) 1.25 MG (50000 UNIT) CAPS capsule Take 1 capsule (50,000 Units total) by mouth every 7 (seven) days.   XIGDUO XR 07-998 MG TB24 TAKE 1 TABLET BY MOUTH TWICE DAILY   zolpidem (AMBIEN) 10 MG tablet Take 10 mg by mouth at bedtime. Rotate with trazodone   [DISCONTINUED] citalopram (  CELEXA) 40 MG tablet Please schedule office visit before any future refill.   [DISCONTINUED] meloxicam (MOBIC) 15 MG tablet TAKE 1 TABLET(15 MG) BY MOUTH DAILY   [DISCONTINUED] OZEMPIC, 2 MG/DOSE, 8 MG/3ML SOPN INJECT 2 MG AS DIRECTED ONCE A WEEK.   [DISCONTINUED] cloNIDine (CATAPRES) 0.1 MG tablet Take 0.1 mg by mouth as needed.    [DISCONTINUED] promethazine-dextromethorphan (PROMETHAZINE-DM) 6.25-15 MG/5ML syrup Take 5 mLs by mouth 4 (four) times daily as needed for cough.   No facility-administered medications prior to visit.   Review of Systems   Constitutional:  Positive for appetite change and fatigue.  Skin:  Positive for wound.   Last CBC Lab Results  Component Value Date   WBC 9.1 06/17/2021   HGB 15.9 06/17/2021   HCT 46.0 06/17/2021   MCV 88 06/17/2021   MCH 30.2 06/17/2021   RDW 12.3 06/17/2021   PLT 433 56/25/6389   Last metabolic panel Lab Results  Component Value Date   GLUCOSE 214 (H) 06/17/2021   NA 139 06/17/2021   K 5.3 (H) 06/17/2021   CL 97 06/17/2021   CO2 23 06/17/2021   BUN 7 06/17/2021   CREATININE 0.69 06/17/2021   EGFR 103 06/17/2021   CALCIUM 10.1 06/17/2021   PROT 7.0 06/17/2021   ALBUMIN 4.7 06/17/2021   LABGLOB 2.3 06/17/2021   AGRATIO 2.0 06/17/2021   BILITOT 0.3 06/17/2021   ALKPHOS 58 06/17/2021   AST 33 06/17/2021   ALT 29 06/17/2021   ANIONGAP 16 (H) 09/10/2017   Last lipids Lab Results  Component Value Date   CHOL 182 06/17/2021   HDL 46 06/17/2021   LDLCALC 93 06/17/2021   TRIG 259 (H) 06/17/2021   CHOLHDL 4.0 06/17/2021   Last hemoglobin A1c Lab Results  Component Value Date   HGBA1C 6.8 (A) 11/07/2021   Last thyroid functions Lab Results  Component Value Date   TSH 0.855 04/30/2020   T3TOTAL 173 08/04/2015   Last vitamin D Lab Results  Component Value Date   VD25OH 55.5 04/30/2020   Last vitamin B12 and Folate Lab Results  Component Value Date   VITAMINB12 321 04/30/2020       Objective    BP (!) 148/85 (BP Location: Right Arm, Patient Position: Sitting, Cuff Size: Large)   Pulse 95   Temp 98.2 F (36.8 C) (Oral)   Resp 16   Ht 5' 10" (1.778 m)   Wt 257 lb (116.6 kg)   LMP  (Exact Date)   SpO2 99%   BMI 36.88 kg/m   BP Readings from Last 3 Encounters:  11/07/21 (!) 148/85  06/17/21 (!) 152/90  01/03/21 127/79   Wt Readings from Last 3 Encounters:  11/07/21 257 lb (116.6 kg)  06/17/21 264 lb 6.4 oz (119.9 kg)  01/03/21 259 lb 1.6 oz (117.5 kg)    Physical Exam Vitals and nursing note reviewed.  Constitutional:      General: She  is not in acute distress.    Appearance: Normal appearance. She is obese. She is not ill-appearing, toxic-appearing or diaphoretic.  HENT:     Head: Normocephalic and atraumatic.  Cardiovascular:     Rate and Rhythm: Normal rate and regular rhythm.     Pulses:          Dorsalis pedis pulses are 1+ on the right side and 1+ on the left side.       Posterior tibial pulses are 2+ on the right side and 2+ on the left side.  Heart sounds: Normal heart sounds. No murmur heard.    No friction rub. No gallop.  Pulmonary:     Effort: Pulmonary effort is normal. No respiratory distress.     Breath sounds: Normal breath sounds. No stridor. No wheezing, rhonchi or rales.  Chest:     Chest wall: No tenderness.  Abdominal:     General: Bowel sounds are normal.     Palpations: Abdomen is soft.  Musculoskeletal:        General: No swelling, tenderness, deformity or signs of injury. Normal range of motion.     Right lower leg: No edema.     Left lower leg: No edema.       Feet:  Feet:     Right foot:     Protective Sensation: 10 sites tested.  6 sites sensed.     Skin integrity: Callus, dry skin and fissure present.     Toenail Condition: Right toenails are normal.     Left foot:     Protective Sensation: 10 sites tested.  6 sites sensed.     Skin integrity: Skin breakdown, callus, dry skin and fissure present.     Toenail Condition: Left toenails are normal.  Skin:    General: Skin is warm and dry.     Capillary Refill: Capillary refill takes less than 2 seconds.     Coloration: Skin is not jaundiced or pale.     Findings: No bruising, erythema, lesion or rash.  Neurological:     General: No focal deficit present.     Mental Status: She is alert and oriented to person, place, and time. Mental status is at baseline.     Cranial Nerves: No cranial nerve deficit.     Sensory: No sensory deficit.     Motor: No weakness.     Coordination: Coordination normal.  Psychiatric:        Mood and  Affect: Mood normal.        Behavior: Behavior normal.        Thought Content: Thought content normal.        Judgment: Judgment normal.      Results for orders placed or performed in visit on 11/07/21  POCT glycosylated hemoglobin (Hb A1C)  Result Value Ref Range   Hemoglobin A1C 6.8 (A) 4.0 - 5.6 %   Est. average glucose Bld gHb Est-mCnc 148     Assessment & Plan   Problem List Items Addressed This Visit       Cardiovascular and Mediastinum   Hypertension associated with diabetes ([place])    Chronic, previously stable However, pt notable upset today given inability to have her welcome to medicare visit and also address her acute concern for her foot Refused repeat BP check Goal <130/<80 Not on current treatment given pt desire not to use clonidine 0.1 mg prn      Relevant Medications   Semaglutide, 2 MG/DOSE, (OZEMPIC, 2 MG/DOSE,) 8 MG/3ML SOPN     Endocrine   Diabetes mellitus ([place]) - Primary    Chronic, previously stable A1c goal of <7% Currently taking ozempic 2 mg/week Continue to recommend balanced, lower carb meals. Smaller meal size, adding snacks. Choosing water as drink of choice and increasing purposeful exercise. Reports numbness in Bilateral toes with current healing blood blister and calluses       Relevant Medications   Semaglutide, 2 MG/DOSE, (OZEMPIC, 2 MG/DOSE,) 8 MG/3ML SOPN   Other Relevant Orders   POCT glycosylated hemoglobin (Hb A1C) (  Completed)   Ambulatory referral to Podiatry   Vitamin B6   Hyperlipidemia associated with type 2 diabetes mellitus ([place])    Chronic, previously elevated with last LDL of 93 Goal of 55-70 with co existing DM Repeat LP I recommend diet low in saturated fat and regular exercise - 30 min at least 5 times per week Currently on Crestor 5 mg       Relevant Medications   Semaglutide, 2 MG/DOSE, (OZEMPIC, 2 MG/DOSE,) 8 MG/3ML SOPN   Other Relevant Orders   Lipid panel   Thyroid nodule    Chronic, previously  stable Request repeat labs at this time  Followed by endocrine previously s/p bx in 2017 with [place] on 2018 showing multiple benign nodules       Relevant Orders   TSH + free T4     Other   Acute bilateral low back pain without sciatica    Chronic back pain, request for continued use of mobic to assist  Recommend CMP      Relevant Medications   meloxicam (MOBIC) 15 MG tablet   Avitaminosis D    Chronic, previously stable request for repeat labs       Relevant Orders   Vitamin D (25 hydroxy)   B12 deficiency    Chronic, previously stable Request for repeat labs       Relevant Orders   B12 and Folate Panel   Blood blister    First noted 8/23- denies injury Popped the site herself d/t pain from swelling on 8/31 with a "burned needle" Referral to podiatry given neuropathy and presence of calluses  Continue to monitor for infection      Relevant Orders   Ambulatory referral to Podiatry   Morbid obesity ([place])    Chronic, stable Body mass index is 36.88 kg/m. Discussed importance of healthy weight management Discussed diet and exercise Associated with DM, HTN, HLD, Depression      Relevant Medications   Semaglutide, 2 MG/DOSE, (OZEMPIC, 2 MG/DOSE,) 8 MG/3ML SOPN   Other Relevant Orders   Comprehensive Metabolic Panel (CMET)   CBC with Differential/Platelet   Myalgia    Continue to recommend weight loss to assist muscle/joint pain Will check vitamins given obesity and depression in addition to report of numbness in toes       Relevant Orders   TSH + free T4   Severe recurrent major depression without psychotic features (HCC)    Chronic, stable Continue celexa at 40 mg       Relevant Medications   citalopram (CELEXA) 40 MG tablet   Vasomotor symptoms due to menopause    Request for hormone levels check given hx of PCOS and irregularity in light of hot flashes       Relevant Orders   FSH/LH   Estradiol    Return for annual examination.      Vonna Kotyk, FNP, have reviewed all documentation for this visit. The documentation on 11/07/21 for the exam, diagnosis, procedures, and orders are all accurate and complete.   Bonnie Garcia, [place] [phone] (phone) [phone] (fax)  [place]

## 2021-11-07 ENCOUNTER — Ambulatory Visit (INDEPENDENT_AMBULATORY_CARE_PROVIDER_SITE_OTHER): Payer: Medicare Other | Admitting: Family Medicine

## 2021-11-07 ENCOUNTER — Encounter: Payer: Self-pay | Admitting: Family Medicine

## 2021-11-07 VITALS — BP 148/85 | HR 95 | Temp 98.2°F | Resp 16 | Ht 70.0 in | Wt 257.0 lb

## 2021-11-07 DIAGNOSIS — M545 Low back pain, unspecified: Secondary | ICD-10-CM

## 2021-11-07 DIAGNOSIS — T148XXA Other injury of unspecified body region, initial encounter: Secondary | ICD-10-CM | POA: Insufficient documentation

## 2021-11-07 DIAGNOSIS — F332 Major depressive disorder, recurrent severe without psychotic features: Secondary | ICD-10-CM

## 2021-11-07 DIAGNOSIS — N951 Menopausal and female climacteric states: Secondary | ICD-10-CM

## 2021-11-07 DIAGNOSIS — M791 Myalgia, unspecified site: Secondary | ICD-10-CM

## 2021-11-07 DIAGNOSIS — E538 Deficiency of other specified B group vitamins: Secondary | ICD-10-CM

## 2021-11-07 DIAGNOSIS — E785 Hyperlipidemia, unspecified: Secondary | ICD-10-CM

## 2021-11-07 DIAGNOSIS — E1159 Type 2 diabetes mellitus with other circulatory complications: Secondary | ICD-10-CM

## 2021-11-07 DIAGNOSIS — E1169 Type 2 diabetes mellitus with other specified complication: Secondary | ICD-10-CM

## 2021-11-07 DIAGNOSIS — G8929 Other chronic pain: Secondary | ICD-10-CM

## 2021-11-07 DIAGNOSIS — E041 Nontoxic single thyroid nodule: Secondary | ICD-10-CM

## 2021-11-07 DIAGNOSIS — Z794 Long term (current) use of insulin: Secondary | ICD-10-CM | POA: Diagnosis not present

## 2021-11-07 DIAGNOSIS — I152 Hypertension secondary to endocrine disorders: Secondary | ICD-10-CM

## 2021-11-07 DIAGNOSIS — E559 Vitamin D deficiency, unspecified: Secondary | ICD-10-CM

## 2021-11-07 LAB — POCT GLYCOSYLATED HEMOGLOBIN (HGB A1C)
Est. average glucose Bld gHb Est-mCnc: 148
Hemoglobin A1C: 6.8 % — AB (ref 4.0–5.6)

## 2021-11-07 MED ORDER — MELOXICAM 15 MG PO TABS: ORAL_TABLET | ORAL | 0 refills | Status: DC

## 2021-11-07 MED ORDER — CITALOPRAM HYDROBROMIDE 40 MG PO TABS: 40.0000 mg | ORAL_TABLET | Freq: Every day | ORAL | 1 refills | Status: DC

## 2021-11-07 MED ORDER — OZEMPIC (2 MG/DOSE) 8 MG/3ML ~~LOC~~ SOPN: PEN_INJECTOR | SUBCUTANEOUS | 3 refills | Status: DC

## 2021-11-07 NOTE — Assessment & Plan Note (Signed)
Chronic, previously stable Request for repeat labs

## 2021-11-07 NOTE — Assessment & Plan Note (Signed)
Chronic, previously stable request for repeat labs

## 2021-11-07 NOTE — Assessment & Plan Note (Signed)
Chronic, previously elevated with last LDL of 93 Goal of 55-70 with co existing DM Repeat LP I recommend diet low in saturated fat and regular exercise - 30 min at least 5 times per week Currently on Crestor 5 mg

## 2021-11-07 NOTE — Assessment & Plan Note (Signed)
Chronic, previously stable However, pt notable upset today given inability to have her welcome to medicare visit and also address her acute concern for her foot Refused repeat BP check Goal <130/<80 Not on current treatment given pt desire not to use clonidine 0.1 mg prn

## 2021-11-07 NOTE — Assessment & Plan Note (Signed)
Request for hormone levels check given hx of PCOS and irregularity in light of hot flashes

## 2021-11-07 NOTE — Assessment & Plan Note (Signed)
First noted 8/23- denies injury Popped the site herself d/t pain from swelling on 8/31 with a "burned needle" Referral to podiatry given neuropathy and presence of calluses  Continue to monitor for infection

## 2021-11-07 NOTE — Assessment & Plan Note (Signed)
Chronic, stable Body mass index is 36.88 kg/m. Discussed importance of healthy weight management Discussed diet and exercise Associated with DM, HTN, HLD, Depression

## 2021-11-07 NOTE — Assessment & Plan Note (Signed)
Chronic, stable Continue celexa at 40 mg

## 2021-11-07 NOTE — Assessment & Plan Note (Signed)
Continue to recommend weight loss to assist muscle/joint pain Will check vitamins given obesity and depression in addition to report of numbness in toes

## 2021-11-07 NOTE — Assessment & Plan Note (Signed)
Chronic, previously stable A1c goal of <7% Currently taking ozempic 2 mg/week Continue to recommend balanced, lower carb meals. Smaller meal size, adding snacks. Choosing water as drink of choice and increasing purposeful exercise. Reports numbness in Bilateral toes with current healing blood blister and calluses

## 2021-11-07 NOTE — Assessment & Plan Note (Signed)
Chronic, previously stable Request repeat labs at this time  Followed by endocrine previously s/p bx in 2017 with [place] on 2018 showing multiple benign nodules

## 2021-11-07 NOTE — Assessment & Plan Note (Signed)
Chronic back pain, request for continued use of mobic to assist  Recommend CMP

## 2021-11-08 NOTE — Progress Notes (Signed)
Hi Bonnie Garcia,  Chemistry is stable; cell count shows slight infection elevation; continue to monitor toe until you are seen by podiatry. Seek urgent care if you develop redness on/near the site.  Cholesterol is improved; fats remain elevated. The 10-year ASCVD risk score (Arnett DK, et al., 2019) is: 10.4%   Values used to calculate the score:     Age: 56 years     Sex: Female     Is Non-Hispanic African American: No     Diabetic: Yes     Tobacco smoker: Yes     Systolic Blood Pressure: 161 mmHg     Is BP treated: No     HDL Cholesterol: 47 mg/dL     Total Cholesterol: 162 mg/dL Heart attack and stroke risk is 10% estimated within the next 10 years which is moderate. I recommend diet low in saturated fat and regular exercise - 30 min at least 5 times per week. Continue Crestor at 5 mg daily to assist.  Hormone labs confirm menopause.  Vit D remains stable.  B 12 is also stable; B 6 is pending.

## 2021-11-13 LAB — CBC WITH DIFFERENTIAL/PLATELET
Basophils Absolute: 0.1 10*3/uL (ref 0.0–0.2)
Basos: 1 %
EOS (ABSOLUTE): 0.2 10*3/uL (ref 0.0–0.4)
Eos: 2 %
Hematocrit: 46.5 % (ref 34.0–46.6)
Hemoglobin: 15.9 g/dL (ref 11.1–15.9)
Immature Grans (Abs): 0 10*3/uL (ref 0.0–0.1)
Immature Granulocytes: 0 %
Lymphocytes Absolute: 4.3 10*3/uL — ABNORMAL HIGH (ref 0.7–3.1)
Lymphs: 39 %
MCH: 30.2 pg (ref 26.6–33.0)
MCHC: 34.2 g/dL (ref 31.5–35.7)
MCV: 88 fL (ref 79–97)
Monocytes Absolute: 0.7 10*3/uL (ref 0.1–0.9)
Monocytes: 6 %
Neutrophils Absolute: 5.7 10*3/uL (ref 1.4–7.0)
Neutrophils: 52 %
Platelets: 400 10*3/uL (ref 150–450)
RBC: 5.26 x10E6/uL (ref 3.77–5.28)
RDW: 12.4 % (ref 11.7–15.4)
WBC: 10.9 10*3/uL — ABNORMAL HIGH (ref 3.4–10.8)

## 2021-11-13 LAB — COMPREHENSIVE METABOLIC PANEL
ALT: 35 IU/L — ABNORMAL HIGH (ref 0–32)
AST: 37 IU/L (ref 0–40)
Albumin/Globulin Ratio: 2 (ref 1.2–2.2)
Albumin: 5 g/dL — ABNORMAL HIGH (ref 3.8–4.9)
Alkaline Phosphatase: 63 IU/L (ref 44–121)
BUN/Creatinine Ratio: 14 (ref 9–23)
BUN: 11 mg/dL (ref 6–24)
Bilirubin Total: 0.3 mg/dL (ref 0.0–1.2)
CO2: 23 mmol/L (ref 20–29)
Calcium: 10.2 mg/dL (ref 8.7–10.2)
Chloride: 96 mmol/L (ref 96–106)
Creatinine, Ser: 0.77 mg/dL (ref 0.57–1.00)
Globulin, Total: 2.5 g/dL (ref 1.5–4.5)
Glucose: 119 mg/dL — ABNORMAL HIGH (ref 70–99)
Potassium: 4.4 mmol/L (ref 3.5–5.2)
Sodium: 140 mmol/L (ref 134–144)
Total Protein: 7.5 g/dL (ref 6.0–8.5)
eGFR: 92 mL/min/{1.73_m2} (ref >59)

## 2021-11-13 LAB — TSH+FREE T4
Free T4: 1.26 ng/dL (ref 0.82–1.77)
TSH: 0.821 u[IU]/mL (ref 0.450–4.500)

## 2021-11-13 LAB — VITAMIN B6: Vitamin B6: 19.1 ug/L (ref 3.4–65.2)

## 2021-11-13 LAB — LIPID PANEL
Chol/HDL Ratio: 3.4 ratio (ref 0.0–4.4)
Cholesterol, Total: 162 mg/dL (ref 100–199)
HDL: 47 mg/dL (ref >39)
LDL Chol Calc (NIH): 81 mg/dL (ref 0–99)
Triglycerides: 204 mg/dL — ABNORMAL HIGH (ref 0–149)
VLDL Cholesterol Cal: 34 mg/dL (ref 5–40)

## 2021-11-13 LAB — ESTRADIOL: Estradiol: <5 pg/mL

## 2021-11-13 LAB — B12 AND FOLATE PANEL
Folate: >20 ng/mL (ref >3.0)
Vitamin B-12: 327 pg/mL (ref 232–1245)

## 2021-11-13 LAB — FSH/LH
FSH: 51.3 m[IU]/mL
LH: 36.1 m[IU]/mL

## 2021-11-13 LAB — VITAMIN D 25 HYDROXY (VIT D DEFICIENCY, FRACTURES): Vit D, 25-Hydroxy: 51.7 ng/mL (ref 30.0–100.0)

## 2022-01-13 NOTE — Progress Notes (Unsigned)
Annual Wellness Visit     Patient: Bonnie Garcia, Female    DOB: 1965-03-28, 56 y.o.   MRN: [id number] Visit Date: 01/15/2022  Today's Provider: [name], FNP   No chief complaint on file.  Subjective    Bonnie Garcia is a 56 y.o. female who presents today for her Annual Wellness Visit. She reports consuming a {diet types:17450} diet. {Exercise:19826} She generally feels {well/fairly well/poorly:18703}. She reports sleeping {well/fairly well/poorly:18703}. She {does/does not:200015} have additional problems to discuss today.   HPI    Medications: Outpatient Medications Prior to Visit  Medication Sig   albuterol (VENTOLIN HFA) 108 (90 Base) MCG/ACT inhaler Inhale 1-2 puffs into the lungs every 6 (six) hours as needed for wheezing or shortness of breath.   aspirin EC 81 MG tablet Take 81 mg by mouth daily.   Blood Glucose Monitoring Suppl (ACCU-CHEK AVIVA PLUS) w/Device KIT 1 Device by Does not apply route 3 (three) times daily.   citalopram (CELEXA) 40 MG tablet Take 1 tablet (40 mg total) by mouth daily.   gabapentin (NEURONTIN) 300 MG capsule Take 300 mg by mouth 3 (three) times daily.    glucose blood test strip Use as instructed to test blood sugar 2-3 times daily   Insulin Pen Needle (B-D UF III MINI PEN NEEDLES) 31G X 5 MM MISC Use as directed to inject ozempic weekly   LORazepam (ATIVAN) 1 MG tablet TK 1 T PO BID UTD FOR PANIC SYMPTOMS   meloxicam (MOBIC) 15 MG tablet TAKE 1 TABLET(15 MG) BY MOUTH DAILY   rosuvastatin (CRESTOR) 5 MG tablet Take 1 tablet (5 mg total) by mouth daily.   Semaglutide, 2 MG/DOSE, (OZEMPIC, 2 MG/DOSE,) 8 MG/3ML SOPN INJECT 2 MG AS DIRECTED ONCE A WEEK.   SPRAVATO, 56 MG DOSE, 28 MG/DEVICE SOPK 1 Dose 2 (two) times a week.   traZODone (DESYREL) 150 MG tablet    Vitamin D, Ergocalciferol, (DRISDOL) 1.25 MG (50000 UNIT) CAPS capsule Take 1 capsule (50,000 Units total) by mouth every 7 (seven) days.   XIGDUO XR 07-998 MG TB24  TAKE 1 TABLET BY MOUTH TWICE DAILY   zolpidem (AMBIEN) 10 MG tablet Take 10 mg by mouth at bedtime. Rotate with trazodone   No facility-administered medications prior to visit.    Allergies  Allergen Reactions   Erythromycin Other (See Comments)    Reaction:  GI upset    Penicillins Hives and Other (See Comments)    Has patient had a PCN reaction causing immediate rash, facial/tongue/throat swelling, SOB or lightheadedness with hypotension: No Has patient had a PCN reaction causing severe rash involving mucus membranes or skin necrosis: No Has patient had a PCN reaction that required hospitalization No Has patient had a PCN reaction occurring within the last 10 years: No If all of the above answers are "NO", then may proceed with Cephalosporin use.    Patient Care Team: Virginia Crews, MD as PCP - General (Family Medicine)  Review of Systems  {Labs  Heme  Chem  Endocrine  Serology  Results Review (optional):23779}    Objective    Vitals: There were no vitals taken for this visit. {Show previous vital signs (optional):23777}   Physical Exam ***  Most recent functional status assessment:    06/17/2021    8:19 AM  In your present state of health, do you have any difficulty performing the following activities:  Hearing? 1  Vision? 1  Difficulty concentrating or making decisions? 0  Walking or climbing stairs? 0  Dressing or bathing? 0  Doing errands, shopping? 0   Most recent fall risk assessment:    06/17/2021    8:19 AM  Fall Risk   Falls in the past year? 1  Number falls in past yr: 1  Injury with Fall? 1  Risk for fall due to : History of fall(s)  Follow up Falls evaluation completed;Education provided;Falls prevention discussed    Most recent depression screenings:    06/17/2021    8:19 AM 01/03/2021   11:24 AM  PHQ 2/9 Scores  PHQ - 2 Score 4 5  PHQ- 9 Score 16 20   Most recent cognitive screening:     No data to display         Most  recent Audit-C alcohol use screening    06/17/2021    8:19 AM  Alcohol Use Disorder Test (AUDIT)  1. How often do you have a drink containing alcohol? 1  2. How many drinks containing alcohol do you have on a typical day when you are drinking? 0  3. How often do you have six or more drinks on one occasion? 0  AUDIT-C Score 1   A score of 3 or more in women, and 4 or more in men indicates increased risk for alcohol abuse, EXCEPT if all of the points are from question 1   No results found for any visits on 01/15/22.  Assessment & Plan     Annual wellness visit done today including the all of the following: Reviewed patient's Family Medical History Reviewed and updated list of patient's medical providers Assessment of cognitive impairment was done Assessed patient's functional ability Established a written schedule for health screening [place] Completed and Reviewed  Exercise Activities and Dietary recommendations  Goals   None     Immunization History  Administered Date(s) Administered   Influenza,inj,Quad PF,6+ Mos 10/21/2017, 12/02/2018, 12/12/2019   Influenza-Unspecified 02/07/2015, 12/31/2020   Moderna Sars-Covid-2 Vaccination 09/23/2019, 10/21/2019, 05/02/2020   Pneumococcal Polysaccharide-23 10/21/2017   Tdap 04/22/2016   Zoster Recombinat (Shingrix) 07/31/2020, 01/03/2021    Health Maintenance  Topic Date Due   Medicare Annual Wellness (AWV)  Never done   OPHTHALMOLOGY EXAM  11/28/2018   COVID-19 Vaccine (4 - Moderna series) 06/27/2020   FOOT EXAM  04/30/2021   MAMMOGRAM  01/12/2022   INFLUENZA VACCINE  06/01/2022 (Originally 10/01/2021)   HEMOGLOBIN A1C  05/08/2022   Diabetic kidney evaluation - Urine ACR  06/18/2022   Diabetic kidney evaluation - GFR measurement  11/08/2022   PAP SMEAR-Modifier  11/19/2022   COLONOSCOPY (Pts 45-14yr Insurance coverage will need to be confirmed)  07/22/2025   TETANUS/TDAP  04/22/2026   Hepatitis C  Screening  Completed   HIV Screening  Completed   Zoster Vaccines- Shingrix  Completed   HPV VACCINES  Aged Out     Discussed health benefits of physical activity, and encouraged her to engage in regular exercise appropriate for her age and condition.    ***  No follow-ups on file.     {provider attestation***:1}   EGwyneth Sprout [phone](phone) [phone](fax)  [place]

## 2022-01-15 ENCOUNTER — Other Ambulatory Visit: Payer: Self-pay | Admitting: Family Medicine

## 2022-01-15 ENCOUNTER — Ambulatory Visit (INDEPENDENT_AMBULATORY_CARE_PROVIDER_SITE_OTHER): Payer: Medicare Other | Admitting: Family Medicine

## 2022-01-15 ENCOUNTER — Encounter: Payer: Self-pay | Admitting: Family Medicine

## 2022-01-15 VITALS — BP 154/89 | HR 91 | Resp 17 | Ht 70.0 in | Wt 260.0 lb

## 2022-01-15 DIAGNOSIS — R0602 Shortness of breath: Secondary | ICD-10-CM

## 2022-01-15 DIAGNOSIS — E1169 Type 2 diabetes mellitus with other specified complication: Secondary | ICD-10-CM

## 2022-01-15 DIAGNOSIS — E1159 Type 2 diabetes mellitus with other circulatory complications: Secondary | ICD-10-CM

## 2022-01-15 DIAGNOSIS — Z794 Long term (current) use of insulin: Secondary | ICD-10-CM

## 2022-01-15 DIAGNOSIS — B3731 Acute candidiasis of vulva and vagina: Secondary | ICD-10-CM

## 2022-01-15 DIAGNOSIS — R300 Dysuria: Secondary | ICD-10-CM

## 2022-01-15 DIAGNOSIS — H66002 Acute suppurative otitis media without spontaneous rupture of ear drum, left ear: Secondary | ICD-10-CM

## 2022-01-15 DIAGNOSIS — K2100 Gastro-esophageal reflux disease with esophagitis, without bleeding: Secondary | ICD-10-CM

## 2022-01-15 DIAGNOSIS — R051 Acute cough: Secondary | ICD-10-CM

## 2022-01-15 DIAGNOSIS — I152 Hypertension secondary to endocrine disorders: Secondary | ICD-10-CM

## 2022-01-15 LAB — POCT URINALYSIS DIPSTICK
Bilirubin, UA: NEGATIVE
Blood, UA: NEGATIVE
Glucose, UA: POSITIVE — AB
Ketones, UA: NEGATIVE
Leukocytes, UA: NEGATIVE
Nitrite, UA: NEGATIVE
Protein, UA: NEGATIVE
Spec Grav, UA: 1.015 (ref 1.010–1.025)
Urobilinogen, UA: 0.2 E.U./dL
pH, UA: 6.5 (ref 5.0–8.0)

## 2022-01-15 MED ORDER — HYDROCOD POLI-CHLORPHE POLI ER 10-8 MG/5ML PO SUER: 5.0000 mL | Freq: Two times a day (BID) | ORAL | 0 refills | Status: DC | PRN

## 2022-01-15 MED ORDER — ALBUTEROL SULFATE HFA 108 (90 BASE) MCG/ACT IN AERS: 1.0000 | INHALATION_SPRAY | Freq: Four times a day (QID) | RESPIRATORY_TRACT | 11 refills | Status: DC | PRN

## 2022-01-15 MED ORDER — SUCRALFATE 1 GM/10ML PO SUSP: 1.0000 g | Freq: Three times a day (TID) | ORAL | 11 refills | Status: DC

## 2022-01-15 MED ORDER — FLUCONAZOLE 150 MG PO TABS: ORAL_TABLET | ORAL | 0 refills | Status: DC

## 2022-01-15 MED ORDER — AMOXICILLIN-POT CLAVULANATE 875-125 MG PO TABS: 1.0000 | ORAL_TABLET | Freq: Two times a day (BID) | ORAL | 0 refills | Status: DC

## 2022-01-15 NOTE — Assessment & Plan Note (Signed)
Acute, UA normal with exception of glucose which is likely increasing need for urinary

## 2022-01-15 NOTE — Assessment & Plan Note (Signed)
Patient reported; declines STI risk OK for diflucan given concern for elevation with Abx use

## 2022-01-15 NOTE — Assessment & Plan Note (Signed)
Chronic, stable Request for medication refills

## 2022-01-15 NOTE — Assessment & Plan Note (Signed)
Chronic, remains elevated Continues to decline use of medication as she believes this is white coat HTN Notes one external BP reading of 120/79 Encouraged to check 3-5 x/week and bring home cuff to appt

## 2022-01-15 NOTE — Assessment & Plan Note (Signed)
LCTAB; reports hx of wheezing

## 2022-01-15 NOTE — Assessment & Plan Note (Signed)
Chronic, previously elevated Due for A1c in 3 weeks Continue to recommend balanced, lower carb meals. Smaller meal size, adding snacks. Choosing water as drink of choice and increasing purposeful exercise. Has not been able to get ozempic d/t supply issues

## 2022-01-15 NOTE — Assessment & Plan Note (Signed)
URI now with [facility] Reports exposure to strep; however, denies throat pain Endorses SOB and acute cough

## 2022-01-15 NOTE — Assessment & Plan Note (Signed)
Acute in s/o recent URI

## 2022-02-06 ENCOUNTER — Ambulatory Visit (INDEPENDENT_AMBULATORY_CARE_PROVIDER_SITE_OTHER): Payer: Medicare Other | Admitting: Family Medicine

## 2022-02-06 ENCOUNTER — Encounter: Payer: Self-pay | Admitting: Family Medicine

## 2022-02-06 VITALS — BP 125/71 | HR 79 | Resp 16 | Ht 70.0 in | Wt 267.0 lb

## 2022-02-06 DIAGNOSIS — E1169 Type 2 diabetes mellitus with other specified complication: Secondary | ICD-10-CM | POA: Diagnosis not present

## 2022-02-06 DIAGNOSIS — Z0000 Encounter for general adult medical examination without abnormal findings: Secondary | ICD-10-CM | POA: Insufficient documentation

## 2022-02-06 DIAGNOSIS — Z1231 Encounter for screening mammogram for malignant neoplasm of breast: Secondary | ICD-10-CM | POA: Insufficient documentation

## 2022-02-06 DIAGNOSIS — Z794 Long term (current) use of insulin: Secondary | ICD-10-CM

## 2022-02-06 LAB — POCT GLYCOSYLATED HEMOGLOBIN (HGB A1C)
Est. average glucose Bld gHb Est-mCnc: 177
Hemoglobin A1C: 7.8 % — AB (ref 4.0–5.6)

## 2022-02-06 MED ORDER — TIRZEPATIDE 7.5 MG/0.5ML ~~LOC~~ SOAJ: 7.5000 mg | SUBCUTANEOUS | 0 refills | Status: DC

## 2022-02-06 MED ORDER — TIRZEPATIDE 2.5 MG/0.5ML ~~LOC~~ SOAJ: 2.5000 mg | SUBCUTANEOUS | 0 refills | Status: DC

## 2022-02-06 MED ORDER — SUCRALFATE 1 GM/10ML PO SUSP: 1.0000 g | Freq: Three times a day (TID) | ORAL | 11 refills | Status: DC

## 2022-02-06 MED ORDER — RSVPREF3 VAC RECOMB ADJUVANTED 120 MCG/0.5ML IM SUSR: 0.5000 mL | Freq: Once | INTRAMUSCULAR | 0 refills | Status: AC

## 2022-02-06 MED ORDER — SEMAGLUTIDE (1 MG/DOSE) 4 MG/3ML ~~LOC~~ SOPN: 1.0000 mg | PEN_INJECTOR | SUBCUTANEOUS | 0 refills | Status: DC

## 2022-02-06 NOTE — Assessment & Plan Note (Signed)
Chronic, stable Request for POC labs and resending medication request given demand vs supply issues. Samples provided; will try again

## 2022-02-06 NOTE — Assessment & Plan Note (Signed)
Depression screening remains elevated; pt contracted for safety Eye exam pending for Spring 2024 EKG completed for hx of HTN; normal rhythm and rate, no changes from 02/2019 Things to do to keep yourself healthy  - Exercise at least 30-45 minutes a day, 3-4 days a week.  - Eat a low-fat diet with lots of fruits and vegetables, up to 7-9 servings per day.  - Seatbelts can save your life. Wear them always.  - Smoke detectors on every level of your home, check batteries every year.  - Eye Doctor - have an eye exam every 1-2 years  - Safe sex - if you may be exposed to STDs, use a condom.  - Alcohol -  If you drink, do it moderately, less than 2 drinks per day.  - Page. Choose someone to speak for you if you are not able.  - Depression is common in our stressful world.If you're feeling down or losing interest in things you normally enjoy, please come in for a visit.  - Violence - If anyone is threatening or hurting you, please call immediately.

## 2022-02-06 NOTE — Assessment & Plan Note (Signed)
Due for screening for mammogram, denies breast concerns, provided with phone number to call and schedule appointment for mammogram. Encouraged to repeat breast cancer screening every 1-2 years.  

## 2022-02-06 NOTE — Progress Notes (Signed)
Annual Wellness Visit     Patient: Bonnie Garcia, Female    DOB: [birth year], 56 y.o.   MRN: [id number] Visit Date: 02/06/2022  Today's Provider: [name], FNP  Re Introduced to nurse practitioner role and practice setting.  All questions answered.  Discussed provider/patient relationship and expectations.  I,Tiffany J Bragg,acting as a scribe for [name], FNP.,have documented all relevant documentation on the behalf of [name], FNP,as directed by  [name], FNP while in the presence of [name], FNP.   Chief Complaint  Patient presents with   Annual Exam   Subjective    Bonnie Garcia is a 56 y.o. female who presents today for her Annual Wellness Visit. She reports consuming a general diet. Home exercise routine includes housework. She generally feels well. She reports sleeping poorly. She does not have additional problems to discuss today.   HPI  Medications: Outpatient Medications Prior to Visit  Medication Sig   albuterol (VENTOLIN HFA) 108 (90 Base) MCG/ACT inhaler Inhale 1-2 puffs into the lungs every 6 (six) hours as needed for wheezing or shortness of breath.   amoxicillin-clavulanate (AUGMENTIN) 875-125 MG tablet Take 1 tablet by mouth 2 (two) times daily.   amphetamine-dextroamphetamine (ADDERALL) 20 MG tablet Take 20 mg by mouth 3 (three) times daily.   aspirin EC 81 MG tablet Take 81 mg by mouth daily.   Blood Glucose Monitoring Suppl (ACCU-CHEK AVIVA PLUS) w/Device KIT 1 Device by Does not apply route 3 (three) times daily.   chlorpheniramine-HYDROcodone (TUSSIONEX) 10-8 MG/5ML Take 5 mLs by mouth every 12 (twelve) hours as needed for cough.   citalopram (CELEXA) 40 MG tablet Take 1 tablet (40 mg total) by mouth daily.   fluconazole (DIFLUCAN) 150 MG tablet Take one tablet by mouth for yeast symptoms; repeat in 4 days if symptoms remain   gabapentin (NEURONTIN) 300 MG capsule Take 300 mg by mouth 3 (three) times daily.     glucose blood test strip Use as instructed to test blood sugar 2-3 times daily   Insulin Pen Needle (B-D UF III MINI PEN NEEDLES) 31G X 5 MM MISC Use as directed to inject ozempic weekly   LORazepam (ATIVAN) 1 MG tablet TK 1 T PO BID UTD FOR PANIC SYMPTOMS   meloxicam (MOBIC) 15 MG tablet TAKE 1 TABLET(15 MG) BY MOUTH DAILY   rosuvastatin (CRESTOR) 5 MG tablet Take 1 tablet (5 mg total) by mouth daily.   SPRAVATO, 56 MG DOSE, 28 MG/DEVICE SOPK 1 Dose 2 (two) times a week.   traZODone (DESYREL) 150 MG tablet    Vitamin D, Ergocalciferol, (DRISDOL) 1.25 MG (50000 UNIT) CAPS capsule Take 1 capsule (50,000 Units total) by mouth every 7 (seven) days.   XIGDUO XR 07-998 MG TB24 TAKE 1 TABLET BY MOUTH TWICE DAILY   zolpidem (AMBIEN) 10 MG tablet Take 10 mg by mouth at bedtime. Rotate with trazodone   [DISCONTINUED] RYBELSUS 14 MG TABS Take 1 tablet by mouth daily.   [DISCONTINUED] Semaglutide, 2 MG/DOSE, (OZEMPIC, 2 MG/DOSE,) 8 MG/3ML SOPN INJECT 2 MG AS DIRECTED ONCE A WEEK.   [DISCONTINUED] sucralfate (CARAFATE) 1 GM/10ML suspension Take 10 mLs (1 g total) by mouth 4 (four) times daily -  with meals and at bedtime.   No facility-administered medications prior to visit.    Allergies  Allergen Reactions   Erythromycin Other (See Comments)    Reaction:  GI upset    Penicillins Hives and Other (  See Comments)    Has patient had a PCN reaction causing immediate rash, facial/tongue/throat swelling, SOB or lightheadedness with hypotension: No Has patient had a PCN reaction causing severe rash involving mucus membranes or skin necrosis: No Has patient had a PCN reaction that required hospitalization No Has patient had a PCN reaction occurring within the last 10 years: No If all of the above answers are "NO", then may proceed with Cephalosporin use.    Patient Care Team: Virginia Crews, MD as PCP - General (Family Medicine)  Review of Systems  Constitutional:  Positive for appetite change.   Eyes:  Positive for visual disturbance.  Genitourinary:  Positive for frequency.    Last CBC Lab Results  Component Value Date   WBC 10.9 (H) 11/07/2021   HGB 15.9 11/07/2021   HCT 46.5 11/07/2021   MCV 88 11/07/2021   MCH 30.2 11/07/2021   RDW 12.4 11/07/2021   PLT 400 15/07/6977   Last metabolic panel Lab Results  Component Value Date   GLUCOSE 119 (H) 11/07/2021   NA 140 11/07/2021   K 4.4 11/07/2021   CL 96 11/07/2021   CO2 23 11/07/2021   BUN 11 11/07/2021   CREATININE 0.77 11/07/2021   EGFR 92 11/07/2021   CALCIUM 10.2 11/07/2021   PROT 7.5 11/07/2021   ALBUMIN 5.0 (H) 11/07/2021   LABGLOB 2.5 11/07/2021   AGRATIO 2.0 11/07/2021   BILITOT 0.3 11/07/2021   ALKPHOS 63 11/07/2021   AST 37 11/07/2021   ALT 35 (H) 11/07/2021   ANIONGAP 16 (H) 09/10/2017   Last lipids Lab Results  Component Value Date   CHOL 162 11/07/2021   HDL 47 11/07/2021   LDLCALC 81 11/07/2021   TRIG 204 (H) 11/07/2021   CHOLHDL 3.4 11/07/2021   Last hemoglobin A1c Lab Results  Component Value Date   HGBA1C 7.8 (A) 02/06/2022   Last thyroid functions Lab Results  Component Value Date   TSH 0.821 11/07/2021   T3TOTAL 173 08/04/2015   Last vitamin D Lab Results  Component Value Date   VD25OH 51.7 11/07/2021   Last vitamin B12 and Folate Lab Results  Component Value Date   VITAMINB12 327 11/07/2021   FOLATE >20.0 11/07/2021        Objective    Vitals: BP 125/71   Pulse 79   Resp 16   Ht _0  (1.778 m)   Wt 267 lb (121.1 kg)   SpO2 98%   BMI 38.31 kg/m   BP Readings from Last 3 Encounters:  02/06/22 125/71  01/15/22 (!) 154/89  11/07/21 (!) 148/85   Wt Readings from Last 3 Encounters:  02/06/22 267 lb (121.1 kg)  01/15/22 260 lb (117.9 kg)  11/07/21 257 lb (116.6 kg)   SpO2 Readings from Last 3 Encounters:  02/06/22 98%  01/15/22 100%  11/07/21 99%   Physical Exam Constitutional:      Appearance: Normal appearance. She is obese.  Cardiovascular:      Rate and Rhythm: Normal rate and regular rhythm.     Pulses: Normal pulses.     Heart sounds: Normal heart sounds.  Pulmonary:     Effort: Pulmonary effort is normal.     Breath sounds: Normal breath sounds.  Skin:    General: Skin is warm and dry.     Capillary Refill: Capillary refill takes less than 2 seconds.  Neurological:     General: No focal deficit present.     Mental Status: She is alert and  oriented to person, place, and time. Mental status is at baseline.  Psychiatric:        Mood and Affect: Mood normal.        Behavior: Behavior normal.        Thought Content: Thought content normal.        Judgment: Judgment normal.    Problem List Items Addressed This Visit       Endocrine   Diabetes mellitus ([place])    Chronic, stable Request for POC labs and resending medication request given demand vs supply issues. Samples provided; will try again      Relevant Medications   Semaglutide, 1 MG/DOSE, 4 MG/3ML SOPN   Other Relevant Orders   POCT glycosylated hemoglobin (Hb A1C) (Completed)     Other   Breast cancer screening by mammogram    Due for screening for mammogram, denies breast concerns, provided with phone number to call and schedule appointment for mammogram. Encouraged to repeat breast cancer screening every 1-2 years.       Relevant Orders   MM 3D SCREEN BREAST BILATERAL   Welcome to Medicare preventive visit - Primary    Depression screening remains elevated; pt contracted for safety Eye exam pending for Spring 2024 EKG completed for hx of HTN; normal rhythm and rate, no changes from 02/2019 Things to do to keep yourself healthy  - Exercise at least 30-45 minutes a day, 3-4 days a week.  - Eat a low-fat diet with lots of fruits and vegetables, up to 7-9 servings per day.  - Seatbelts can save your life. Wear them always.  - Smoke detectors on every level of your home, check batteries every year.  - Eye Doctor - have an eye exam every 1-2 years  -  Safe sex - if you may be exposed to STDs, use a condom.  - Alcohol -  If you drink, do it moderately, less than 2 drinks per day.  - [place]atz. Choose someone to speak for you if you are not able.  - Depression is common in our stressful world.If you're feeling down or losing interest in things you normally enjoy, please come in for a visit.  - Violence - If anyone is threatening or hurting you, please call immediately.       Relevant Orders   EKG 12-Lead     Most recent functional status assessment:    02/06/2022    2:53 PM  In your present state of health, do you have any difficulty performing the following activities:  Hearing? 1  Vision? 1  Difficulty concentrating or making decisions? 1  Walking or climbing stairs? 1  Dressing or bathing? 0  Doing errands, shopping? 0   Most recent fall risk assessment:    02/06/2022    2:52 PM  Fall Risk   Falls in the past year? 1  Number falls in past yr: 0  Injury with Fall? 1    Most recent depression screenings:    02/06/2022    2:53 PM 01/15/2022    3:09 PM  PHQ 2/9 Scores  PHQ - 2 Score 4 4  PHQ- 9 Score 18 15   Most recent cognitive screening:     No data to display         Most recent Audit-C alcohol use screening    02/06/2022    2:53 PM  Alcohol Use Disorder Test (AUDIT)  1. How often do you have a drink containing alcohol? 0  2. How many drinks containing alcohol do you have on a typical day when you are drinking? 0  3. How often do you have six or more drinks on one occasion? 0  AUDIT-C Score 0   A score of 3 or more in women, and 4 or more in men indicates increased risk for alcohol abuse, EXCEPT if all of the points are from question 1   Results for orders placed or performed in visit on 02/06/22  POCT glycosylated hemoglobin (Hb A1C)  Result Value Ref Range   Hemoglobin A1C 7.8 (A) 4.0 - 5.6 %   Est. average glucose Bld gHb Est-mCnc 177     Assessment & Plan     Annual  wellness visit done today including the all of the following: Reviewed patient's Family Medical History Reviewed and updated list of patient's medical providers Assessment of cognitive impairment was done Assessed patient's functional ability Established a written schedule for health screening Abanda Completed and Reviewed  Exercise Activities and Dietary recommendations  Goals   None     Immunization History  Administered Date(s) Administered   Influenza,inj,Quad PF,6+ Mos 10/21/2017, 12/02/2018, 12/12/2019   Influenza-Unspecified 02/07/2015, 12/31/2020   Moderna Sars-Covid-2 Vaccination 09/23/2019, 10/21/2019, 05/02/2020   Pneumococcal Polysaccharide-23 10/21/2017   Tdap 04/22/2016   Zoster Recombinat (Shingrix) 07/31/2020, 01/03/2021    Health Maintenance  Topic Date Due   MAMMOGRAM  01/12/2022   OPHTHALMOLOGY EXAM  02/06/2022 (Originally 11/28/2018)   COVID-19 Vaccine (4 - 2023-24 season) 02/22/2022 (Originally 11/01/2021)   INFLUENZA VACCINE  06/01/2022 (Originally 10/01/2021)   Diabetic kidney evaluation - Urine ACR  06/18/2022   HEMOGLOBIN A1C  08/08/2022   Diabetic kidney evaluation - GFR measurement  11/08/2022   PAP SMEAR-Modifier  11/19/2022   FOOT EXAM  02/07/2023   Medicare Annual Wellness (AWV)  02/07/2023   COLONOSCOPY (Pts 45-68yr Insurance coverage will need to be confirmed)  07/22/2025   DTaP/Tdap/Td (2 - Td or Tdap) 04/22/2026   Hepatitis C Screening  Completed   HIV Screening  Completed   Zoster Vaccines- Shingrix  Completed   HPV VACCINES  Aged Out     Discussed health benefits of physical activity, and encouraged her to engage in regular exercise appropriate for her age and condition.    IVonna Kotyk FNP, have reviewed all documentation for this visit. The documentation on 02/06/22 for the exam, diagnosis, procedures, and orders are all accurate and complete.  Return in about 3 months (around 05/08/2022) for chonic disease  management.    EGwyneth Sprout [phone](phone) [phone](fax)  [place]

## 2022-02-28 ENCOUNTER — Other Ambulatory Visit: Payer: Self-pay | Admitting: Family Medicine

## 2022-02-28 MED ORDER — SUCRALFATE 1 G PO TABS: 1.0000 g | ORAL_TABLET | Freq: Three times a day (TID) | ORAL | 0 refills | Status: DC

## 2022-03-07 ENCOUNTER — Ambulatory Visit: Payer: Medicare Other

## 2022-03-07 LAB — HM DIABETES EYE EXAM

## 2022-03-18 ENCOUNTER — Encounter: Payer: Self-pay | Admitting: Family Medicine

## 2022-04-16 ENCOUNTER — Other Ambulatory Visit: Payer: Self-pay | Admitting: Family Medicine

## 2022-05-02 ENCOUNTER — Other Ambulatory Visit: Payer: Self-pay | Admitting: Family Medicine

## 2022-05-02 DIAGNOSIS — M545 Low back pain, unspecified: Secondary | ICD-10-CM

## 2022-05-08 ENCOUNTER — Ambulatory Visit (INDEPENDENT_AMBULATORY_CARE_PROVIDER_SITE_OTHER): Payer: Medicare Other | Admitting: Family Medicine

## 2022-05-08 ENCOUNTER — Encounter: Payer: Self-pay | Admitting: Family Medicine

## 2022-05-08 VITALS — BP 121/78 | HR 86 | Ht 70.0 in | Wt 262.0 lb

## 2022-05-08 DIAGNOSIS — D582 Other hemoglobinopathies: Secondary | ICD-10-CM | POA: Diagnosis not present

## 2022-05-08 DIAGNOSIS — E785 Hyperlipidemia, unspecified: Secondary | ICD-10-CM

## 2022-05-08 DIAGNOSIS — F332 Major depressive disorder, recurrent severe without psychotic features: Secondary | ICD-10-CM

## 2022-05-08 DIAGNOSIS — I152 Hypertension secondary to endocrine disorders: Secondary | ICD-10-CM

## 2022-05-08 DIAGNOSIS — E1159 Type 2 diabetes mellitus with other circulatory complications: Secondary | ICD-10-CM

## 2022-05-08 DIAGNOSIS — Z794 Long term (current) use of insulin: Secondary | ICD-10-CM

## 2022-05-08 DIAGNOSIS — E1169 Type 2 diabetes mellitus with other specified complication: Secondary | ICD-10-CM

## 2022-05-08 LAB — POCT GLYCOSYLATED HEMOGLOBIN (HGB A1C): Hemoglobin A1C: 8.2 % — AB (ref 4.0–5.6)

## 2022-05-08 NOTE — Assessment & Plan Note (Signed)
Chronic, worsening Body mass index is 37.59 kg/m. Associated with HTN HLD Depression

## 2022-05-08 NOTE — Assessment & Plan Note (Signed)
Chronic, historically Labs in 9/27 stable Continue to monitor at minimum of1 year

## 2022-05-08 NOTE — Patient Instructions (Addendum)
Please call and schedule your mammogram:  [facility]  [place], [place],  [place]  [id number] Get Driving Directions Main: [phone]  Sunday:Closed Monday:7:20 AM - 5:00 PM Tuesday:7:20 AM - 5:00 PM Wednesday:7:20 AM - 5:00 PM Thursday:7:20 AM - 5:00 PM Friday:7:20 AM - 4:30 PM Saturday:Closed  The 10-year ASCVD risk score (Arnett DK, et al., 2019) is: 3.2%   Values used to calculate the score:     Age: 57 years     Sex: Female     Is Non-Hispanic African American: No     Diabetic: Yes     Tobacco smoker: No     Systolic Blood Pressure: 123XX123 mmHg     Is BP treated: No     HDL Cholesterol: 47 mg/dL     Total Cholesterol: 162 mg/dL

## 2022-05-08 NOTE — Assessment & Plan Note (Signed)
Chronic, at goal Appears to have white coat hypertension Encourage more checks and bringing home machine to next appt in 3 months

## 2022-05-08 NOTE — Assessment & Plan Note (Signed)
Chronic, previously elevated A1c goal <7% Pt noted 6 weeks without ozempic; however has received the medication now

## 2022-05-08 NOTE — Progress Notes (Signed)
Established patient visit  Patient: Bonnie Garcia   DOB: 26-Sep-1965   57 y.o. Female  MRN: [id number] Visit Date: 05/08/2022  Today's healthcare provider: [name], FNP  Re Introduced to nurse practitioner role and practice setting.  All questions answered.  Discussed provider/patient relationship and expectations.  Subjective    HPI HPI   Diabetes f/u Last edited by Elta Guadeloupe, CMA on 05/08/2022  2:04 PM.      Diabetes Mellitus Type II, follow-up  Lab Results  Component Value Date   HGBA1C 8.2 (A) 05/08/2022   HGBA1C 7.8 (A) 02/06/2022   HGBA1C 6.8 (A) 11/07/2021   Last seen for diabetes 3 months ago.  Management since then includes continuing the same treatment. She reports good compliance with treatment. She is having side effects. 1 episode of nausea with emesis  Home blood sugar records: not being checked  Episodes of hypoglycemia? No    Current insulin regiment: none Most Recent Eye Exam: UTD per pt report  --------------------------------------------------------------------------------------------------- Hypertension, follow-up  BP Readings from Last 3 Encounters:  05/08/22 121/78  02/06/22 125/71  01/15/22 (!) 154/89   Wt Readings from Last 3 Encounters:  05/08/22 262 lb (118.8 kg)  02/06/22 267 lb (121.1 kg)  01/15/22 260 lb (117.9 kg)     She was last seen for hypertension 3 months ago.  BP at that visit was 125/71. Management since that visit includes no changes. She reports excellent compliance with treatment. She is not having side effects.   Outside blood pressures are being checked, infrequently.  --------------------------------------------------------------------------------------------------- Lipid/Cholesterol, follow-up  Last Lipid Panel: Lab Results  Component Value Date   CHOL 162 11/07/2021   LDLCALC 81 11/07/2021   HDL 47 11/07/2021   TRIG 204 (H) 11/07/2021    She was last seen for this 3 months ago.   Management since that visit includes no changes.  She reports excellent compliance with treatment. She is not having side effects.    Last metabolic panel Lab Results  Component Value Date   GLUCOSE 119 (H) 11/07/2021   NA 140 11/07/2021   K 4.4 11/07/2021   BUN 11 11/07/2021   CREATININE 0.77 11/07/2021   EGFR 92 11/07/2021   GFRNONAA 97 06/23/2019   CALCIUM 10.2 11/07/2021   AST 37 11/07/2021   ALT 35 (H) 11/07/2021   The 10-year ASCVD risk score (Arnett DK, et al., 2019) is: 3.2%  ---------------------------------------------------------------------------------------------------   Medications: Outpatient Medications Prior to Visit  Medication Sig   albuterol (VENTOLIN HFA) 108 (90 Base) MCG/ACT inhaler Inhale 1-2 puffs into the lungs every 6 (six) hours as needed for wheezing or shortness of breath.   amphetamine-dextroamphetamine (ADDERALL) 20 MG tablet Take 20 mg by mouth 3 (three) times daily.   aspirin EC 81 MG tablet Take 81 mg by mouth daily.   Blood Glucose Monitoring Suppl (ACCU-CHEK AVIVA PLUS) w/Device KIT 1 Device by Does not apply route 3 (three) times daily.   chlorpheniramine-HYDROcodone (TUSSIONEX) 10-8 MG/5ML Take 5 mLs by mouth every 12 (twelve) hours as needed for cough.   citalopram (CELEXA) 40 MG tablet Take 1 tablet (40 mg total) by mouth daily.   gabapentin (NEURONTIN) 300 MG capsule Take 300 mg by mouth 3 (three) times daily.    glucose blood test strip Use as instructed to test blood sugar 2-3 times daily   Insulin Pen Needle (B-D UF III MINI PEN NEEDLES) 31G X 5 MM MISC Use as directed to inject ozempic weekly  LORazepam (ATIVAN) 1 MG tablet TK 1 T PO BID UTD FOR PANIC SYMPTOMS   meloxicam (MOBIC) 15 MG tablet TAKE 1 TABLET(15 MG) BY MOUTH DAILY   rosuvastatin (CRESTOR) 5 MG tablet TAKE 1 TABLET(5 MG) BY MOUTH DAILY   Semaglutide, 1 MG/DOSE, 4 MG/3ML SOPN Inject 1 mg into the skin once a week. (Patient taking differently: Inject 2 mg into the  skin once a week.)   SPRAVATO, 56 MG DOSE, 28 MG/DEVICE SOPK 1 Dose 2 (two) times a week.   sucralfate (CARAFATE) 1 g tablet Take 1 tablet (1 g total) by mouth 4 (four) times daily -  with meals and at bedtime.   traZODone (DESYREL) 150 MG tablet    Vitamin D, Ergocalciferol, (DRISDOL) 1.25 MG (50000 UNIT) CAPS capsule Take 1 capsule (50,000 Units total) by mouth every 7 (seven) days.   XIGDUO XR 07-998 MG TB24 TAKE 1 TABLET BY MOUTH TWICE DAILY   zolpidem (AMBIEN) 10 MG tablet Take 10 mg by mouth at bedtime. Rotate with trazodone   [DISCONTINUED] amoxicillin-clavulanate (AUGMENTIN) 875-125 MG tablet Take 1 tablet by mouth 2 (two) times daily.   [DISCONTINUED] fluconazole (DIFLUCAN) 150 MG tablet Take one tablet by mouth for yeast symptoms; repeat in 4 days if symptoms remain   No facility-administered medications prior to visit.    Review of Systems  Constitutional:  Negative for appetite change and fatigue.  Eyes:  Negative for visual disturbance.  Respiratory:  Negative for chest tightness and shortness of breath.   Cardiovascular:  Negative for chest pain and leg swelling.  Gastrointestinal:  Negative for abdominal pain, nausea and vomiting.  Neurological:  Negative for dizziness, light-headedness, numbness and headaches.    Last CBC Lab Results  Component Value Date   WBC 10.9 (H) 11/07/2021   HGB 15.9 11/07/2021   HCT 46.5 11/07/2021   MCV 88 11/07/2021   MCH 30.2 11/07/2021   RDW 12.4 11/07/2021   PLT 400 [id number]   Last metabolic panel Lab Results  Component Value Date   GLUCOSE 119 (H) 11/07/2021   NA 140 11/07/2021   K 4.4 11/07/2021   CL 96 11/07/2021   CO2 23 11/07/2021   BUN 11 11/07/2021   CREATININE 0.77 11/07/2021   EGFR 92 11/07/2021   CALCIUM 10.2 11/07/2021   PROT 7.5 11/07/2021   ALBUMIN 5.0 (H) 11/07/2021   LABGLOB 2.5 11/07/2021   AGRATIO 2.0 11/07/2021   BILITOT 0.3 11/07/2021   ALKPHOS 63 11/07/2021   AST 37 11/07/2021   ALT 35 (H)  11/07/2021   ANIONGAP 16 (H) 09/10/2017   Last lipids Lab Results  Component Value Date   CHOL 162 11/07/2021   HDL 47 11/07/2021   LDLCALC 81 11/07/2021   TRIG 204 (H) 11/07/2021   CHOLHDL 3.4 11/07/2021   Last hemoglobin A1c Lab Results  Component Value Date   HGBA1C 8.2 (A) 05/08/2022       Objective    BP 121/78 Comment: 2/28  Pulse 86   Ht '5\' 10"'$  (1.778 m)   Wt 262 lb (118.8 kg)   SpO2 98%   BMI 37.59 kg/m  BP Readings from Last 3 Encounters:  05/08/22 121/78  02/06/22 125/71  01/15/22 (!) 154/89   Wt Readings from Last 3 Encounters:  05/08/22 262 lb (118.8 kg)  02/06/22 267 lb (121.1 kg)  01/15/22 260 lb (117.9 kg)   SpO2 Readings from Last 3 Encounters:  05/08/22 98%  02/06/22 98%  01/15/22 100%  Physical Exam Vitals and nursing note reviewed.  Constitutional:      General: She is not in acute distress.    Appearance: Normal appearance. She is obese. She is not ill-appearing, toxic-appearing or diaphoretic.  HENT:     Head: Normocephalic and atraumatic.  Cardiovascular:     Rate and Rhythm: Normal rate and regular rhythm.     Pulses: Normal pulses.     Heart sounds: Normal heart sounds. No murmur heard.    No friction rub. No gallop.  Pulmonary:     Effort: Pulmonary effort is normal. No respiratory distress.     Breath sounds: Normal breath sounds. No stridor. No wheezing, rhonchi or rales.  Chest:     Chest wall: No tenderness.  Abdominal:     General: Bowel sounds are normal.     Palpations: Abdomen is soft.  Musculoskeletal:        General: No swelling, tenderness, deformity or signs of injury. Normal range of motion.     Right lower leg: No edema.     Left lower leg: No edema.  Skin:    General: Skin is warm and dry.     Capillary Refill: Capillary refill takes less than 2 seconds.     Coloration: Skin is not jaundiced or pale.     Findings: No bruising, erythema, lesion or rash.  Neurological:     General: No focal deficit  present.     Mental Status: She is alert and oriented to person, place, and time. Mental status is at baseline.     Cranial Nerves: No cranial nerve deficit.     Sensory: No sensory deficit.     Motor: No weakness.     Coordination: Coordination normal.  Psychiatric:        Mood and Affect: Mood normal.        Behavior: Behavior normal.        Thought Content: Thought content normal.        Judgment: Judgment normal.      Results for orders placed or performed in visit on 05/08/22  POCT glycosylated hemoglobin (Hb A1C)  Result Value Ref Range   Hemoglobin A1C 8.2 (A) 4.0 - 5.6 %   HbA1c POC (<> result, manual entry)     HbA1c, POC (prediabetic range)     HbA1c, POC (controlled diabetic range)      Assessment & Plan     Problem List Items Addressed This Visit       Cardiovascular and Mediastinum   Hypertension associated with diabetes ([place])    Chronic, at goal Appears to have white coat hypertension Encourage more checks and bringing home machine to next appt in 3 months        Endocrine   Diabetes mellitus ([place]) - Primary    Chronic, previously elevated A1c goal <7% Pt noted 6 weeks without ozempic; however has received the medication now      Relevant Orders   POCT glycosylated hemoglobin (Hb A1C) (Completed)   Hyperlipidemia associated with type 2 diabetes mellitus (HCC)    Chronic, previously elevated trigs and borderline LDL at 81 Goal LDL <70 recommend diet low in saturated fat and regular exercise - 30 min at least 5 times per week Crestor 5 mg used to assist        Other   Elevated hemoglobin (HCC)    Chronic, historically Labs in 9/27 stable Continue to monitor at minimum of1 year      Morbid obesity (  HCC)    Chronic, worsening Body mass index is 37.59 kg/m. Associated with HTN HLD Depression      Severe recurrent major depression without psychotic features (HCC)    Chronic, slight improvement Continues celexa at 40 mg daily      Return  in about 3 months (around 08/08/2022) for T2DM management, HTN management.     Vonna Kotyk, FNP, have reviewed all documentation for this visit. The documentation on 05/08/22 for the exam, diagnosis, procedures, and orders are all accurate and complete.  [name], [place] [phone] (phone) [phone] (fax)  [place]

## 2022-05-08 NOTE — Assessment & Plan Note (Signed)
Chronic, previously elevated trigs and borderline LDL at 81 Goal LDL <70 recommend diet low in saturated fat and regular exercise - 30 min at least 5 times per week Crestor 5 mg used to assist

## 2022-05-08 NOTE — Assessment & Plan Note (Signed)
Chronic, slight improvement Continues celexa at 40 mg daily

## 2022-07-29 ENCOUNTER — Telehealth: Payer: Self-pay | Admitting: Family Medicine

## 2022-07-29 MED ORDER — DAPAGLIFLOZIN PRO-METFORMIN ER 5-1000 MG PO TB24
1.0000 | ORAL_TABLET | Freq: Two times a day (BID) | ORAL | 1 refills | Status: AC
Start: 2022-07-29 — End: ?

## 2022-07-29 NOTE — Telephone Encounter (Signed)
[facility] pharmacy faxed refill request for the following medications:   XIGDUO XR 07-998 MG TB24     Please advise

## 2022-08-06 ENCOUNTER — Ambulatory Visit: Payer: Medicare Other | Admitting: Family Medicine

## 2022-08-13 ENCOUNTER — Other Ambulatory Visit: Payer: Self-pay | Admitting: Family Medicine

## 2022-08-13 DIAGNOSIS — M545 Low back pain, unspecified: Secondary | ICD-10-CM

## 2022-08-25 ENCOUNTER — Encounter: Payer: Self-pay | Admitting: Family Medicine

## 2022-08-25 ENCOUNTER — Ambulatory Visit (INDEPENDENT_AMBULATORY_CARE_PROVIDER_SITE_OTHER): Payer: Medicare Other | Admitting: Family Medicine

## 2022-08-25 VITALS — BP 136/82 | HR 90 | Wt 259.2 lb

## 2022-08-25 DIAGNOSIS — E1159 Type 2 diabetes mellitus with other circulatory complications: Secondary | ICD-10-CM

## 2022-08-25 DIAGNOSIS — E785 Hyperlipidemia, unspecified: Secondary | ICD-10-CM

## 2022-08-25 DIAGNOSIS — H6121 Impacted cerumen, right ear: Secondary | ICD-10-CM

## 2022-08-25 DIAGNOSIS — Z794 Long term (current) use of insulin: Secondary | ICD-10-CM | POA: Diagnosis not present

## 2022-08-25 DIAGNOSIS — R300 Dysuria: Secondary | ICD-10-CM | POA: Diagnosis not present

## 2022-08-25 DIAGNOSIS — I152 Hypertension secondary to endocrine disorders: Secondary | ICD-10-CM

## 2022-08-25 DIAGNOSIS — E1169 Type 2 diabetes mellitus with other specified complication: Secondary | ICD-10-CM

## 2022-08-25 LAB — POCT GLYCOSYLATED HEMOGLOBIN (HGB A1C): Hemoglobin A1C: 7.3 % — AB (ref 4.0–5.6)

## 2022-08-25 LAB — POCT URINALYSIS DIPSTICK
Bilirubin, UA: NEGATIVE
Blood, UA: NEGATIVE
Glucose, UA: POSITIVE — AB
Ketones, UA: NEGATIVE
Leukocytes, UA: NEGATIVE
Nitrite, UA: NEGATIVE
Protein, UA: NEGATIVE
Spec Grav, UA: 1.015 (ref 1.010–1.025)
Urobilinogen, UA: 0.2 E.U./dL
pH, UA: 6 (ref 5.0–8.0)

## 2022-08-25 NOTE — Progress Notes (Signed)
I,[name],acting as a Neurosurgeon for Bonnie Latch, MD.,have documented all relevant documentation on the behalf of Bonnie Latch, MD,as directed by  Bonnie Latch, MD while in the presence of Bonnie Latch, MD.   Established patient visit   Patient: Bonnie Garcia   DOB: October 16, 1965   57 y.o. Female  MRN: [id number] Visit Date: 08/25/2022  Today's healthcare provider: Shirlee Latch, MD   Chief Complaint  Patient presents with   Follow-up   Subjective    HPI  Hypertension, follow-up  BP Readings from Last 3 Encounters:  08/25/22 136/82  05/08/22 121/78  02/06/22 125/71   Wt Readings from Last 3 Encounters:  08/25/22 259 lb 3.2 oz (117.6 kg)  05/08/22 262 lb (118.8 kg)  02/06/22 267 lb (121.1 kg)     She was last seen for hypertension 3 months ago.  BP at that visit was 121/78. Management since that visit includes continue current treatment.  Outside blood pressures are not being checked. Symptoms: No chest pain No chest pressure  No palpitations No syncope  No dyspnea No orthopnea  No paroxysmal nocturnal dyspnea No lower extremity edema   Pertinent labs Lab Results  Component Value Date   CHOL 162 11/07/2021   HDL 47 11/07/2021   LDLCALC 81 11/07/2021   TRIG 204 (H) 11/07/2021   CHOLHDL 3.4 11/07/2021   Lab Results  Component Value Date   NA 140 11/07/2021   K 4.4 11/07/2021   CREATININE 0.77 11/07/2021   EGFR 92 11/07/2021   GLUCOSE 119 (H) 11/07/2021   TSH 0.821 11/07/2021     The 10-year ASCVD risk score (Arnett DK, et al., 2019) is: 4%  --------------------------------------------------------------------------------------------------- Diabetes Mellitus Type II, Follow-up  Lab Results  Component Value Date   HGBA1C 7.3 (A) 08/25/2022   HGBA1C 8.2 (A) 05/08/2022   HGBA1C 7.8 (A) 02/06/2022   Wt Readings from Last 3 Encounters:  08/25/22 259 lb 3.2 oz (117.6 kg)  05/08/22 262 lb (118.8 kg)  02/06/22 267 lb  (121.1 kg)   Last seen for diabetes 3 months ago.  Management since then includes continue current treatment. She reports excellent compliance with treatment. She is not having side effects.  Symptoms: No fatigue No foot ulcerations  No appetite changes No nausea  No paresthesia of the feet  No polydipsia  No polyuria No visual disturbances   No vomiting     Home blood sugar records:  170's  Pertinent Labs: Lab Results  Component Value Date   CHOL 162 11/07/2021   HDL 47 11/07/2021   LDLCALC 81 11/07/2021   TRIG 204 (H) 11/07/2021   CHOLHDL 3.4 11/07/2021   Lab Results  Component Value Date   NA 140 11/07/2021   K 4.4 11/07/2021   CREATININE 0.77 11/07/2021   EGFR 92 11/07/2021   LABMICR 4.3 06/17/2021   MICRALBCREAT 11 06/17/2021    ---------------------------------------------------------------------------------------------------  Discussed the use of AI scribe software for clinical note transcription with the patient, who gave verbal consent to proceed.  History of Present Illness   The patient, with a history of diabetes, hypertension, and hyperlipidemia, presents with urinary symptoms that started on May 6th. The patient has been under significant stress due to her mother's health issues, including a urinary tract infection, large kidney stone, and sepsis. The patient has been staying with her mother around the clock in the hospital and rehab, which has affected her hydration and urinary habits. The patient also reports a recent fall resulting in  a hairline fracture and sprain. The patient's diabetes is managed with Xigduo twice daily and Ozempic 2mg  weekly, but she expresses a desire to switch to [facility] for better weight loss. The patient's blood pressure is managed with lifestyle modifications, and she is not on any medication for hypertension.        -----------------------------------------------------------------------    Medications: Outpatient Medications  Prior to Visit  Medication Sig   albuterol (VENTOLIN HFA) 108 (90 Base) MCG/ACT inhaler Inhale 1-2 puffs into the lungs every 6 (six) hours as needed for wheezing or shortness of breath.   amphetamine-dextroamphetamine (ADDERALL) 20 MG tablet Take 20 mg by mouth 3 (three) times daily.   aspirin EC 81 MG tablet Take 81 mg by mouth daily.   Blood Glucose Monitoring Suppl (ACCU-CHEK AVIVA PLUS) w/Device KIT 1 Device by Does not apply route 3 (three) times daily.   chlorpheniramine-HYDROcodone (TUSSIONEX) 10-8 MG/5ML Take 5 mLs by mouth every 12 (twelve) hours as needed for cough.   citalopram (CELEXA) 40 MG tablet Take 1 tablet (40 mg total) by mouth daily.   clonazePAM (KLONOPIN) 1 MG tablet Take by mouth. Take as needed   Dapagliflozin Pro-metFORMIN ER (XIGDUO XR) 07-998 MG TB24 Take 1 tablet by mouth 2 (two) times daily.   gabapentin (NEURONTIN) 300 MG capsule Take 300 mg by mouth 3 (three) times daily.    glucose blood test strip Use as instructed to test blood sugar 2-3 times daily   Insulin Pen Needle (B-D UF III MINI PEN NEEDLES) 31G X 5 MM MISC Use as directed to inject ozempic weekly   LORazepam (ATIVAN) 1 MG tablet TK 1 T PO BID UTD FOR PANIC SYMPTOMS   meloxicam (MOBIC) 15 MG tablet TAKE 1 TABLET(15 MG) BY MOUTH DAILY   rosuvastatin (CRESTOR) 5 MG tablet TAKE 1 TABLET(5 MG) BY MOUTH DAILY   Semaglutide, 1 MG/DOSE, 4 MG/3ML SOPN Inject 1 mg into the skin once a week. (Patient taking differently: Inject 2 mg into the skin once a week.)   SPRAVATO, 56 MG DOSE, 28 MG/DEVICE SOPK 1 Dose 2 (two) times a week.   sucralfate (CARAFATE) 1 g tablet Take 1 tablet (1 g total) by mouth 4 (four) times daily -  with meals and at bedtime.   traZODone (DESYREL) 150 MG tablet    Vitamin D, Ergocalciferol, (DRISDOL) 1.25 MG (50000 UNIT) CAPS capsule Take 1 capsule (50,000 Units total) by mouth every 7 (seven) days.   zolpidem (AMBIEN) 10 MG tablet Take 10 mg by mouth at bedtime. Rotate with trazodone   No  facility-administered medications prior to visit.    Review of Systems per HPI     Objective    BP 136/82 (BP Location: Left Arm, Patient Position: Sitting, Cuff Size: Large)   Pulse 90   Wt 259 lb 3.2 oz (117.6 kg) Comment: wearing boot  BMI 37.19 kg/m    Physical Exam Vitals reviewed.  Constitutional:      General: She is not in acute distress.    Appearance: Normal appearance. She is well-developed. She is not diaphoretic.  HENT:     Head: Normocephalic and atraumatic.  Eyes:     General: No scleral icterus.    Conjunctiva/sclera: Conjunctivae normal.  Neck:     Thyroid: No thyromegaly.  Cardiovascular:     Rate and Rhythm: Normal rate and regular rhythm.     Heart sounds: Normal heart sounds. No murmur heard. Pulmonary:     Effort: Pulmonary effort is normal. No respiratory  distress.     Breath sounds: Normal breath sounds. No wheezing, rhonchi or rales.  Musculoskeletal:     Cervical back: Neck supple.     Right lower leg: No edema.     Left lower leg: No edema.  Lymphadenopathy:     Cervical: No cervical adenopathy.  Skin:    General: Skin is warm and dry.     Findings: No rash.  Neurological:     Mental Status: She is alert and oriented to person, place, and time. Mental status is at baseline.  Psychiatric:        Mood and Affect: Mood normal.        Behavior: Behavior normal.      Results for orders placed or performed in visit on 08/25/22  POCT glycosylated hemoglobin (Hb A1C)  Result Value Ref Range   Hemoglobin A1C 7.3 (A) 4.0 - 5.6 %   HbA1c POC (<> result, manual entry)     HbA1c, POC (prediabetic range)     HbA1c, POC (controlled diabetic range)    POCT Urinalysis Dipstick  Result Value Ref Range   Color, UA     Clarity, UA     Glucose, UA Positive (A) Negative   Bilirubin, UA negative    Ketones, UA negative    Spec Grav, UA 1.015 1.010 - 1.025   Blood, UA negative    pH, UA 6.0 5.0 - 8.0   Protein, UA Negative Negative    Urobilinogen, UA 0.2 0.2 or 1.0 E.U./dL   Nitrite, UA negative    Leukocytes, UA Negative Negative   Appearance     Odor      Assessment & Plan     Problem List Items Addressed This Visit       Cardiovascular and Mediastinum   Hypertension associated with diabetes (HCC)    Blood pressure controlled at 132/82 without medication. - Continue lifestyle modifications for blood pressure control.        Endocrine   Diabetes mellitus (HCC) - Primary    A1c improved from 8.2 to 7.3. Currently on Xigduo twice daily and Ozempic 2mg  weekly. - Continue current medications. - Consider switching from Ozempic to [facility] for better glycemic control and weight loss, pending insurance approval. - will involve PharmD to help with med assistance      Relevant Orders   POCT glycosylated hemoglobin (Hb A1C) (Completed)   Microalbumin / creatinine urine ratio   AMB Referral to Pharmacy Medication Management   Hyperlipidemia associated with type 2 diabetes mellitus (HCC)    Stable at last reading Recheck CMP and lipid at next visit        Other   Dysuria   Relevant Orders   POCT Urinalysis Dipstick (Completed)   Urine Culture   Morbid obesity (HCC)    Discussed importance of healthy weight management Discussed diet and exercise       Other Visit Diagnoses     Impacted cerumen of right ear             Urinary Symptoms: New onset of urinary frequency, urgency, and dysuria since May 6th. Urinalysis showed no leukocytes or nitrites, but did have glucose. Urine culture pending. - Await urine culture results.  Cerumen Impaction: Right ear discomfort due to cerumen impaction. - Recommend over-the-counter Debrox drops for ear wax removal. - If symptoms persist, consider in-office ear irrigation.  General Health Maintenance: - Continue annual diabetes urine test for protein. - Follow up in three months.  Return in about 3 months (around 11/25/2022) for chronic disease f/u.       I, Bonnie Latch, MD, have reviewed all documentation for this visit. The documentation on 08/25/22 for the exam, diagnosis, procedures, and orders are all accurate and complete.   Gerad Cornelio, Marzella Schlein, MD, MPH [facility]

## 2022-08-25 NOTE — Assessment & Plan Note (Signed)
Discussed importance of healthy weight management Discussed diet and exercise  

## 2022-08-25 NOTE — Assessment & Plan Note (Signed)
Stable at last reading Recheck CMP and lipid at next visit

## 2022-08-25 NOTE — Assessment & Plan Note (Signed)
A1c improved from 8.2 to 7.3. Currently on Xigduo twice daily and Ozempic 2mg  weekly. - Continue current medications. - Consider switching from Ozempic to [facility] for better glycemic control and weight loss, pending insurance approval. - will involve PharmD to help with med assistance

## 2022-08-25 NOTE — Assessment & Plan Note (Signed)
Blood pressure controlled at 132/82 without medication. - Continue lifestyle modifications for blood pressure control.

## 2022-08-26 ENCOUNTER — Ambulatory Visit: Payer: Self-pay | Admitting: *Deleted

## 2022-08-26 LAB — MICROALBUMIN / CREATININE URINE RATIO
Creatinine, Urine: 53 mg/dL
Microalb/Creat Ratio: 7 mg/g creat (ref 0–29)
Microalbumin, Urine: 3.6 ug/mL

## 2022-08-26 MED ORDER — SILVER SULFADIAZINE 1 % EX CREA
1.0000 | TOPICAL_CREAM | Freq: Every day | CUTANEOUS | 0 refills | Status: DC
Start: 2022-08-26 — End: 2022-10-01

## 2022-08-26 NOTE — Telephone Encounter (Signed)
Summary: med ?   Pt called in, says forgot to ask Dr B , yesterday at her appt, bout getting  sunburn cream for her sunburn on face and shoulders. She says Aloe isnt helping, the med starts with the word Silver.... , asking for it to be sent to [id number], [place] - [address] AT [facility] OF [address] & [facility] Phone: [phone] Fax: [phone]       Chief Complaint: sunburn requesting medication Symptoms: sunburn to face , shoulders, arms from Sunday and blister on nose burst/popped skin intact. No other blisters reported. Skin feeling sore and tender Frequency: Sunday 08/24/22 Pertinent Negatives: Patient denies fever, no chills today only on Sunday night.  Disposition: [] ED /[] Urgent Care (no appt availability in office) / [] Appointment(In office/virtual)/ []  [facility]/ [] Home Care/ [] Refused Recommended Disposition /[]  Mobile Bus/ [x]  Follow-up with PCP Additional Notes:   Recommended OV and patient reports she was just seen yesterday . Forgot to request medication for sunburn due to Aloe not effective.  Requesting "silva"dene cream. Reports she had this before for sunburn and treated well. Please advise if another OV needed.      Reason for Disposition  Blisters on the face  Answer Assessment - Initial Assessment Questions 1. APPEARANCE of SKIN: "What does it look like?" "Where is the sunburn located?"      Red face and shoulders and arms 2. BLISTERS: "Are there any blisters?" If Yes, ask: "Where are they located?" "How big are they?" "Are they closed or broken?"      Feel tight and sore on skin. Blister only noted on nose and burst skin intact per patient 3. EXTENT: "How much of the body has a sunburn?" "How large is the area of blistering?" (can compare to the size of their palm)     Face shoulders arms sunburn, blister only to nose 4. ONSET: "When did the sun exposure occur?" (e.g., hours or days ago)      Sunday ,  08/24/22 5. PAIN: "How painful is the sunburn?"  (Scale 1-10; mild, moderate or severe)   - MILD (1-3): doesn't interfere with normal activities    - MODERATE (4-7): interferes with normal activities or awakens from sleep    - SEVERE (8-10):  excruciating pain, unable to do any normal activities      Moderate level 6 6. OTHER SYMPTOMS: "Do you have any other symptoms?" (e.g., dizziness, eye pain, fever, nausea)     Soreness of skin .  7. PREGNANCY: "Is there any chance you are pregnant?" "When was your last menstrual period?"     na  Protocols used: Sunburn-A-AH

## 2022-08-26 NOTE — Telephone Encounter (Signed)
Patient advised. Verbalized understanding

## 2022-08-27 LAB — URINE CULTURE

## 2022-10-01 ENCOUNTER — Other Ambulatory Visit: Payer: Self-pay | Admitting: Family Medicine

## 2022-10-01 ENCOUNTER — Other Ambulatory Visit: Payer: BC Managed Care – PPO | Admitting: Pharmacist

## 2022-10-01 DIAGNOSIS — Z794 Long term (current) use of insulin: Secondary | ICD-10-CM

## 2022-10-01 MED ORDER — TIRZEPATIDE 5 MG/0.5ML ~~LOC~~ SOAJ
5.0000 mg | SUBCUTANEOUS | 0 refills | Status: DC
Start: 2022-10-01 — End: 2022-11-06

## 2022-10-01 NOTE — Patient Instructions (Signed)
Goals Addressed             This Visit's Progress    Pharmacy Goals       Our goal A1c is less than 7%. This corresponds with fasting sugars less than 130 and 2 hour after meal sugars less than 180. Please keep a log of your results when checking your blood sugar   Our goal bad cholesterol, or LDL, is less than 70. This is why it is important to continue taking your rosuvastatin.  Like [place] may cause stomach upset, queasiness, or constipation, especially when first starting. This generally improves over time. Call our office if these symptoms occur and worsen, or if you have severe symptoms such as vomiting, diarrhea, or stomach pain.   Thank you!  Estelle Grumbles, PharmD, [facility] [phone]

## 2022-10-01 NOTE — Progress Notes (Signed)
10/01/2022 Name: Bonnie Garcia MRN: [id number] DOB: 1965-12-01  Chief Complaint  Patient presents with   Medication Management   Medication Assistance    Bonnie Garcia is a 57 y.o. year old female who presented for a telephone visit.   They were referred to the pharmacist by their PCP for assistance in managing diabetes and medication access.    Subjective:  Care Team: Primary Care Provider: Erasmo Downer, MD ; Next Scheduled Visit: 12/02/2022  Medication Access/Adherence  Current Pharmacy:  [id number], [place] - [address] AT [facility] OF [address] [facility] [place] [address] Phone: [phone] Fax: [phone]  [id number] - [place], [place] - [address] [place] [address] Phone: [phone] Fax: [phone]   Patient reports affordability concerns with their medications: Yes  Patient reports access/transportation concerns to their pharmacy: No  Patient reports adherence concerns with their medications:  No    Reports using a weekly pillbox  Diabetes:  Current medications:  - Xigduo XR 07-998 mg twice daily - Ozempic 2 mg weekly (reports tolerates well)  Reports currently has 2 more doses of Ozempic left from latest refill  Previous therapies tried: Trulicity, glipizide  Current glucose readings: morning fasting ranging: 170s-206  Reports has been working on making positive dietary changes, including cutting back on sweet tea  Reports continues to work on: - limiting portions of pasta, watermelon and pineapple - incorporating in more non-starchy vegetables, including green beans, broccoli, cauliflower   Current physical activity: reports currently staying active throughout the day, but reports not currently going for walks  Reports interested in weight loss  Statin therapy: rosuvastatin 5 mg daily  Current medication access support: copay savings card for  Ozempic   Objective:  Lab Results  Component Value Date   HGBA1C 7.3 (A) 08/25/2022    Lab Results  Component Value Date   CREATININE 0.77 11/07/2021   BUN 11 11/07/2021   NA 140 11/07/2021   K 4.4 11/07/2021   CL 96 11/07/2021   CO2 23 11/07/2021    Lab Results  Component Value Date   CHOL 162 11/07/2021   HDL 47 11/07/2021   LDLCALC 81 11/07/2021   TRIG 204 (H) 11/07/2021   CHOLHDL 3.4 11/07/2021   BP Readings from Last 3 Encounters:  08/25/22 136/82  05/08/22 121/78  02/06/22 125/71   Pulse Readings from Last 3 Encounters:  08/25/22 90  05/08/22 86  02/06/22 79     Medications Reviewed Today     Reviewed by Manuela Neptune, RPH-CPP (Pharmacist) on 10/01/22 at 1020  Med List Status: <None>   Medication Order Taking? Sig Documenting Provider Last Dose Status Informant  albuterol (VENTOLIN HFA) 108 (90 Base) MCG/ACT inhaler [id number] Yes Inhale 1-2 puffs into the lungs every 6 (six) hours as needed for wheezing or shortness of breath. Jacky Kindle, FNP Taking Active   amphetamine-dextroamphetamine (ADDERALL) 20 MG tablet [id number] Yes Take 20 mg by mouth 3 (three) times daily. [provider] Taking Active   aspirin EC 81 MG tablet [id number] Yes Take 81 mg by mouth daily. [provider] Taking Active Self  Blood Glucose Monitoring Suppl (ACCU-CHEK AVIVA PLUS) w/Device KIT [id number]  1 Device by Does not apply route 3 (three) times daily. Jacky Kindle, FNP  Active   Cholecalciferol 25 MCG (1000 UT) capsule [id number] Yes Take 1,000 Units by mouth daily. [provider] Taking Active   citalopram (CELEXA) 40 MG tablet [id number] Yes Take 1 tablet (40 mg total) by mouth daily. Jacky Kindle, FNP Taking Active   clonazePAM Scarlette Calico) 1 MG tablet [id number] Yes Take 1 mg by mouth at bedtime as needed. Take as needed [provider] Taking Active   Dapagliflozin Pro-metFORMIN ER (XIGDUO XR) 07-998 MG TB24 [id number] Yes Take 1  tablet by mouth 2 (two) times daily. Erasmo Downer, MD Taking Active   gabapentin (NEURONTIN) 300 MG capsule [id number] Yes Take 300 mg by mouth 3 (three) times daily.  [provider] Taking Active   glucose blood test strip [id number]  Use as instructed to test blood sugar 2-3 times daily Merita Norton T, FNP  Active   Insulin Pen Needle (B-D UF III MINI PEN NEEDLES) 31G X 5 MM MISC [id number]  Use as directed to inject ozempic weekly Beryle Flock, Marzella Schlein, MD  Active   LORazepam (ATIVAN) 1 MG tablet [id number] Yes TK 1 T PO BID UTD FOR PANIC SYMPTOMS [provider] Taking Active   meloxicam (MOBIC) 15 MG tablet [id number] Yes TAKE 1 TABLET(15 MG) BY MOUTH DAILY Jacky Kindle, FNP Taking Active   rosuvastatin (CRESTOR) 5 MG tablet [id number] Yes TAKE 1 TABLET(5 MG) BY MOUTH DAILY Beryle Flock Marzella Schlein, MD Taking Active   Semaglutide, 1 MG/DOSE, 4 MG/3ML SOPN [id number] Yes Inject 1 mg into the skin once a week.  Patient taking differently: Inject 2 mg into the skin once a week.   Jacky Kindle, FNP Taking Active   SPRAVATO, 56 MG DOSE, 28 MG/DEVICE SOPK [id number] No 1 Dose 2 (two) times a week.  Patient not taking: Reported on 10/01/2022   [provider] Not Taking Active   sucralfate (CARAFATE) 1 g tablet [id number] Yes Take 1 tablet (1 g total) by mouth 4 (four) times daily -  with meals and at bedtime.  Patient taking differently: Take 1 g by mouth 4 (four) times daily -  with meals and at bedtime. Takes as needed   Jacky Kindle, FNP Taking Active   traZODone (DESYREL) 150 MG tablet [id number] Yes Take 150 mg by mouth at bedtime. [provider] Taking Active               Assessment/Plan:   Comprehensive medication review performed; medication list updated in electronic medical record - Caution patient for risk of dizziness or sedation with clonazepam, gabapentin and lorazepam, particularly if taken in combination  Patient reports takes  clonazepam only at bedtime as needed  Denies dizziness or sedation with lorazepam or gabapentin  Diabetes: - Currently uncontrolled - Reviewed long term cardiovascular and renal outcomes of uncontrolled blood sugar - Reviewed dietary modifications including: Encourage patient to have regular well-balanced meals and snacks, while controlling carbohydrate portion sizes Continue to work on reducing sugary beverage consumption and drinking water in its place Work on incorporating more non-starchy vegetables into meals - Reviewed lifestyle modifications including: increasing walking as able - Recommend to continue to check glucose, keep log of results and have this record to review during upcoming medical appointments - Encourage patient to contact pharmacy for refill of rosuvastatin - From review of formulary for patient's [facility] [employer] PPO Plan (70/30) from website, Greggory Keen is a tier 2 option for patient that requires prior authorization Submit PA request for [facility] via covermymeds on behalf of patient. Receive response that no further action is needed Collaborate with Dr. Roxan Hockey, covering for patient's PCP today,  to request provider send [facility] prescription to pharmacy for patient  Provider sends Rx for Mounjaro 5 mg weekly Follow up with [facility]. Mounjaro 5 mg prescription is covered for $25 per 1 month supply for patient Follow up with patient to provide update and counseling on switch from Ozempic to Zeb. Patient advises that she plans to complete current 2 week supply remaining of Ozempic and then start the Mounjaro 5 mg weekly the following week.    Follow Up Plan: Clinical Pharmacist will follow up with patient by telephone on 11/12/2022 at 10:30 am  Estelle Grumbles, PharmD, [facility] [phone]

## 2022-10-01 NOTE — Progress Notes (Signed)
Switched from ozempic to [employer] 5mg  weekly

## 2022-11-01 ENCOUNTER — Other Ambulatory Visit: Payer: Self-pay | Admitting: Family Medicine

## 2022-11-01 DIAGNOSIS — Z794 Long term (current) use of insulin: Secondary | ICD-10-CM

## 2022-11-04 ENCOUNTER — Telehealth: Payer: Self-pay | Admitting: Family Medicine

## 2022-11-04 MED ORDER — GLUCOSE BLOOD VI STRP: ORAL_STRIP | 12 refills | Status: DC

## 2022-11-04 NOTE — Telephone Encounter (Signed)
Received a fax from covermymeds for Ozempic 8mg /25ml  Key: [id number]

## 2022-11-04 NOTE — Telephone Encounter (Signed)
Duplicate request- Rx 11/04/22 #100 12RF Requested Prescriptions  Pending Prescriptions Disp Refills   ACCU-CHEK GUIDE test strip [Pharmacy Med Name: ACCU-CHEK GUIDE TEST STRIPS 50] 100 strip     Sig: USE AS INSTRUCTED TO TEST BLOOD SUGAR TWICE TO THREE TIMES DAILY     Endocrinology: Diabetes - Testing Supplies Passed - 11/01/2022 11:08 AM      Passed - Valid encounter within last 12 months    Recent Outpatient Visits           2 months ago Type 2 diabetes mellitus with other specified complication, with long-term current use of insulin (HCC)   [facility], Marzella Schlein, MD   6 months ago Type 2 diabetes mellitus with other specified complication, with long-term current use of insulin [facility])   [facility] Jacky Kindle, FNP   9 months ago [employer] preventive visit   [facility] Merita Norton T, FNP   9 months ago Type 2 diabetes mellitus with other specified complication, with long-term current use of insulin [facility])   [facility] Merita Norton T, FNP   12 months ago Type 2 diabetes mellitus with other specified complication, with long-term current use of insulin [facility])   [facility] Merita Norton T, FNP       Future Appointments             In 4 weeks Bacigalupo, Marzella Schlein, MD [facility]

## 2022-11-04 NOTE — Telephone Encounter (Signed)
[facility] pharmacy is requesting prescription refill ACCCU- CHEK GUIDE TEST STRIPS 50 Please advise

## 2022-11-05 NOTE — Telephone Encounter (Signed)
Patient is not on Ozempic. Patient is on Mounjaro.  Patient would like dosage of Mounjaro be increase. Please Review.

## 2022-11-06 MED ORDER — TIRZEPATIDE 7.5 MG/0.5ML ~~LOC~~ SOAJ: 7.5000 mg | SUBCUTANEOUS | 2 refills | Status: DC

## 2022-11-06 NOTE — Telephone Encounter (Signed)
Received a fax from covermymeds for [facility] 7.5/0.104ml  Key: Ladean Raya

## 2022-11-06 NOTE — Telephone Encounter (Signed)
Ok to increase mounjaro to 7.5 mg weekly (send in one month supply with 2 refills)

## 2022-11-07 ENCOUNTER — Telehealth: Payer: Self-pay | Admitting: Family Medicine

## 2022-11-07 NOTE — Telephone Encounter (Signed)
Covermymeds is requesting prior authorization Key: [id number] Name: Bonnie Garcia Ozempic 2MG /dose

## 2022-11-07 NOTE — Telephone Encounter (Signed)
PA initiated and faxed notes to be attach.

## 2022-11-10 NOTE — Telephone Encounter (Signed)
PA renew today. New Key [id number]

## 2022-11-11 NOTE — Telephone Encounter (Addendum)
Outcome Approved today by [facility] NCPDP 2017 Your PA request has been approved. Additional information will be provided in the approval communication. (Message 1145) Authorization Expiration Date: 11/09/2025 Drug Mounjaro 7.5MG /0.5ML pen-injectors  Patient aware of approval.

## 2022-11-11 NOTE — Telephone Encounter (Signed)
Patient not on Ozempic

## 2022-11-12 ENCOUNTER — Other Ambulatory Visit: Payer: Self-pay | Admitting: Family Medicine

## 2022-11-12 ENCOUNTER — Other Ambulatory Visit: Payer: Medicare Other | Admitting: Pharmacist

## 2022-11-12 DIAGNOSIS — Z794 Long term (current) use of insulin: Secondary | ICD-10-CM

## 2022-11-12 NOTE — Progress Notes (Signed)
11/12/2022 Name: Adilene Roque MRN: [id number] DOB: 1966/02/20  Chief Complaint  Patient presents with   Medication Management   Medication Assistance    Bonnie Garcia is a 57 y.o. year old female who presented for a telephone visit.   They were referred to the pharmacist by their PCP for assistance in managing diabetes and medication access.      Subjective:   Care Team: Primary Care Provider: Erasmo Downer, MD ; Next Scheduled Visit: 12/02/2022    Medication Access/Adherence  Current Pharmacy:  [id number], [place] - [address] AT [facility] OF [address] [facility] [place] [address] Phone: [phone] Fax: [phone]  [id number] - [place], [place] - [address] [place] [address] Phone: [phone] Fax: [phone]   Patient reports affordability concerns with their medications: No, reports cost of Mounjaro now affordable Patient reports access/transportation concerns to their pharmacy: No  Patient reports adherence concerns with their medications:  No     Reports uses a weekly pillbox   Diabetes:   Current medications:  - Xigduo XR 07-998 mg twice daily - Mounjaro 5 mg weekly on Saturdays  Reports tolerating well and planning to increase to Mounjaro 7.5 mg weekly, as discussed with PCP, on 9/14   Previous therapies tried: Trulicity, glipizide, Ozempic   Current glucose readings: morning fasting ranging: 97-150 - Attributes variation to readings to dietary habits   Reports has maintained making positive dietary changes, including cutting back on sweet tea     Current physical activity: reports currently staying active throughout the day and plans to increase walking   Reports interested in weight loss   Statin therapy: rosuvastatin 5 mg daily   Current medication access support: copay savings card for Mounjaro       Objective:  Lab Results  Component Value  Date   HGBA1C 7.3 (A) 08/25/2022    Lab Results  Component Value Date   CREATININE 0.77 11/07/2021   BUN 11 11/07/2021   NA 140 11/07/2021   K 4.4 11/07/2021   CL 96 11/07/2021   CO2 23 11/07/2021    Lab Results  Component Value Date   CHOL 162 11/07/2021   HDL 47 11/07/2021   LDLCALC 81 11/07/2021   TRIG 204 (H) 11/07/2021   CHOLHDL 3.4 11/07/2021    Medications Reviewed Today     Reviewed by Manuela Neptune, RPH-CPP (Pharmacist) on 11/12/22 at 1046  Med List Status: <None>   Medication Order Taking? Sig Documenting Provider Last Dose Status Informant  albuterol (VENTOLIN HFA) 108 (90 Base) MCG/ACT inhaler [id number]  Inhale 1-2 puffs into the lungs every 6 (six) hours as needed for wheezing or shortness of breath. Jacky Kindle, FNP  Active   amphetamine-dextroamphetamine (ADDERALL) 20 MG tablet [id number]  Take 20 mg by mouth 3 (three) times daily. [provider]  Active   aspirin EC 81 MG tablet [id number]  Take 81 mg by mouth daily. [provider]  Active Self  Blood Glucose Monitoring Suppl (ACCU-CHEK AVIVA PLUS) w/Device KIT [id number]  1 Device by Does not apply route 3 (three) times daily. Jacky Kindle, FNP  Active   Cholecalciferol 25 MCG (1000 UT) capsule [id number]  Take 1,000 Units by mouth daily. [provider]  Active   citalopram (CELEXA) 40 MG tablet [id number]  Take 1 tablet (40 mg total) by mouth daily. Jacky Kindle, FNP  Active   clonazePAM (KLONOPIN) 1 MG tablet [id number]  Take 1 mg by mouth at bedtime as needed. Take as needed [provider]  Active   Dapagliflozin Pro-metFORMIN ER (XIGDUO XR) 07-998 MG TB24 [id number] Yes Take 1 tablet by mouth 2 (two) times daily. Erasmo Downer, MD Taking Active   gabapentin (NEURONTIN) 300 MG capsule [id number]  Take 300 mg by mouth 3 (three) times daily.  [provider]  Active   glucose blood test strip [id number]  Use as instructed to test blood sugar 2-3 times  daily Merita Norton T, FNP  Active   glucose blood test strip [id number]  Use to check blood sugar 2-3 times daily. Erasmo Downer, MD  Active   Insulin Pen Needle (B-D UF III MINI PEN NEEDLES) 31G X 5 MM MISC [id number]  Use as directed to inject ozempic weekly Beryle Flock, Marzella Schlein, MD  Active   LORazepam (ATIVAN) 1 MG tablet [id number]  TK 1 T PO BID UTD FOR PANIC SYMPTOMS [provider]  Active   meloxicam (MOBIC) 15 MG tablet [id number]  TAKE 1 TABLET(15 MG) BY MOUTH DAILY Merita Norton T, FNP  Active   rosuvastatin (CRESTOR) 5 MG tablet [id number]  TAKE 1 TABLET(5 MG) BY MOUTH DAILY Bacigalupo, Marzella Schlein, MD  Active   SPRAVATO, 56 MG DOSE, 28 MG/DEVICE SOPK [id number]  1 Dose 2 (two) times a week.  Patient not taking: Reported on 10/01/2022   [provider]  Active   sucralfate (CARAFATE) 1 g tablet [id number]  Take 1 tablet (1 g total) by mouth 4 (four) times daily -  with meals and at bedtime.  Patient taking differently: Take 1 g by mouth 4 (four) times daily -  with meals and at bedtime. Takes as needed   Merita Norton T, FNP  Active   tirzepatide [facility]) 7.5 MG/0.5ML Pen [id number]  Inject 7.5 mg into the skin once a week. Erasmo Downer, MD  Active   traZODone (DESYREL) 150 MG tablet [id number]  Take 150 mg by mouth at bedtime. [provider]  Active               Assessment/Plan:   Diabetes: - Reviewed dietary modifications including: Encourage patient to have regular well-balanced meals and snacks, while controlling carbohydrate portion sizes Continue to work on reducing sugary beverage consumption and drinking water in its place - Reviewed lifestyle modifications including: increasing walking as able - Recommend to continue to check glucose, keep log of results and have this record to review during upcoming medical appointments     Follow Up Plan:   Patient denies further medication questions or concerns today Provide patient with  contact information for clinic pharmacist to contact if needed in future for medication questions/concerns    Estelle Grumbles, PharmD, [facility] [phone]

## 2022-11-12 NOTE — Patient Instructions (Signed)
Goals Addressed             This Visit's Progress    Pharmacy Goals       Our goal A1c is less than 7%. This corresponds with fasting sugars less than 130 and 2 hour after meal sugars less than 180. Please keep a log of your results when checking your blood sugar   Our goal bad cholesterol, or LDL, is less than 70. This is why it is important to continue taking your rosuvastatin.   Thank you!  Estelle Grumbles, PharmD, [facility] [phone]

## 2022-12-02 ENCOUNTER — Ambulatory Visit: Payer: Medicare Other | Admitting: Family Medicine

## 2022-12-02 ENCOUNTER — Encounter: Payer: Self-pay | Admitting: Family Medicine

## 2022-12-02 VITALS — BP 135/79 | HR 92 | Ht 70.0 in | Wt 254.8 lb

## 2022-12-02 DIAGNOSIS — Z23 Encounter for immunization: Secondary | ICD-10-CM | POA: Diagnosis not present

## 2022-12-02 DIAGNOSIS — E1159 Type 2 diabetes mellitus with other circulatory complications: Secondary | ICD-10-CM | POA: Diagnosis not present

## 2022-12-02 DIAGNOSIS — M545 Low back pain, unspecified: Secondary | ICD-10-CM

## 2022-12-02 DIAGNOSIS — E559 Vitamin D deficiency, unspecified: Secondary | ICD-10-CM

## 2022-12-02 DIAGNOSIS — E041 Nontoxic single thyroid nodule: Secondary | ICD-10-CM | POA: Diagnosis not present

## 2022-12-02 DIAGNOSIS — D72829 Elevated white blood cell count, unspecified: Secondary | ICD-10-CM

## 2022-12-02 DIAGNOSIS — E1169 Type 2 diabetes mellitus with other specified complication: Secondary | ICD-10-CM | POA: Diagnosis not present

## 2022-12-02 DIAGNOSIS — I152 Hypertension secondary to endocrine disorders: Secondary | ICD-10-CM

## 2022-12-02 DIAGNOSIS — E538 Deficiency of other specified B group vitamins: Secondary | ICD-10-CM

## 2022-12-02 DIAGNOSIS — R0789 Other chest pain: Secondary | ICD-10-CM | POA: Diagnosis not present

## 2022-12-02 DIAGNOSIS — Z794 Long term (current) use of insulin: Secondary | ICD-10-CM

## 2022-12-02 DIAGNOSIS — F332 Major depressive disorder, recurrent severe without psychotic features: Secondary | ICD-10-CM

## 2022-12-02 MED ORDER — MELOXICAM 15 MG PO TABS
ORAL_TABLET | ORAL | 0 refills | Status: DC
Start: 2022-12-02 — End: 2023-03-09

## 2022-12-02 MED ORDER — CITALOPRAM HYDROBROMIDE 40 MG PO TABS
40.0000 mg | ORAL_TABLET | Freq: Every day | ORAL | 1 refills | Status: DC
Start: 2022-12-02 — End: 2023-06-08

## 2022-12-02 MED ORDER — TIRZEPATIDE 7.5 MG/0.5ML ~~LOC~~ SOAJ: 7.5000 mg | SUBCUTANEOUS | 2 refills | Status: DC

## 2022-12-02 MED ORDER — ROSUVASTATIN CALCIUM 5 MG PO TABS: 5.0000 mg | ORAL_TABLET | Freq: Every day | ORAL | 1 refills | Status: DC

## 2022-12-02 MED ORDER — DAPAGLIFLOZIN PRO-METFORMIN ER 5-1000 MG PO TB24: 1.0000 | ORAL_TABLET | Freq: Two times a day (BID) | ORAL | 1 refills | Status: DC

## 2022-12-02 NOTE — Progress Notes (Signed)
Established Patient Office Visit  Subjective   Patient ID: Bonnie Garcia, female    DOB: Sep 04, 1965  Age: 57 y.o. MRN: [id number]  Chief Complaint  Patient presents with   Medical Management of Chronic Issues    Bonnie Garcia is here for medical management of chronic conditions. She is doing well with Mounjaro. Denies side effects.  Complaining of intermittent chest pain. This is located centrally along the sternum but more towards the right side. Brief in nature, occurs for a couple of seconds and then goes away. Notes tingling in her arms that have been happening intermittently as well.  Requesting regular screening labs as she has not had them in a while.   Patient Active Problem List   Diagnosis Date Noted   Leukocytosis 12/02/2022   Other chest pain 12/02/2022   Elevated hemoglobin (HCC) 05/08/2022   Breast cancer screening by mammogram 02/06/2022   Vaginal yeast infection 01/15/2022   Shortness of breath 01/15/2022   Gastroesophageal reflux disease with esophagitis without hemorrhage 01/15/2022   Blood blister 11/07/2021   Vasomotor symptoms due to menopause 11/07/2021   Acute pain of right shoulder 06/17/2021   Adhesive capsulitis of right shoulder 06/17/2021   Avitaminosis D 01/03/2021   Left hip pain 01/03/2021   Chronic bilateral low back pain without sciatica 06/23/2019   Myalgia 12/17/2017   Thyroid nodule 10/21/2017   Hyperlipidemia associated with type 2 diabetes mellitus (HCC) 10/21/2017   PCOS (polycystic ovarian syndrome) 10/21/2017   Hirsutism 10/21/2017   Dysuria 10/21/2017   Severe recurrent major depression without psychotic features (HCC) 09/11/2017   ADHD (attention deficit hyperactivity disorder) 09/11/2017   Diabetes mellitus (HCC) 09/11/2017   Hypertension associated with diabetes (HCC) 09/11/2017   B12 deficiency 10/13/2013   Morbid obesity (HCC) 10/13/2013   Ureteric stone 06/08/2013   Calculus of kidney 01/21/2013      Review of Systems   Cardiovascular:  Positive for chest pain.  Gastrointestinal:  Negative for nausea and vomiting.  Neurological:  Positive for tingling.     Objective:     BP 135/79 (BP Location: Left Arm, Patient Position: Sitting, Cuff Size: Large)   Pulse 92   Ht 5\' 10"  (1.778 m)   Wt 254 lb 12.8 oz (115.6 kg)   SpO2 97%   BMI 36.56 kg/m  BP Readings from Last 3 Encounters:  12/02/22 135/79  08/25/22 136/82  05/08/22 121/78   Wt Readings from Last 3 Encounters:  12/02/22 254 lb 12.8 oz (115.6 kg)  08/25/22 259 lb 3.2 oz (117.6 kg)  05/08/22 262 lb (118.8 kg)   Physical Exam Constitutional:      Appearance: Normal appearance.  Cardiovascular:     Rate and Rhythm: Normal rate and regular rhythm.     Heart sounds: Normal heart sounds.  Pulmonary:     Effort: Pulmonary effort is normal.     Breath sounds: Normal breath sounds.  Neurological:     General: No focal deficit present.     Mental Status: She is alert and oriented to person, place, and time.    No results found for any visits on 12/02/22.  Last metabolic panel Lab Results  Component Value Date   GLUCOSE 119 (H) 11/07/2021   NA 140 11/07/2021   K 4.4 11/07/2021   CL 96 11/07/2021   CO2 23 11/07/2021   BUN 11 11/07/2021   CREATININE 0.77 11/07/2021   EGFR 92 11/07/2021   CALCIUM 10.2 11/07/2021   PROT 7.5 11/07/2021   ALBUMIN  5.0 (H) 11/07/2021   LABGLOB 2.5 11/07/2021   AGRATIO 2.0 11/07/2021   BILITOT 0.3 11/07/2021   ALKPHOS 63 11/07/2021   AST 37 11/07/2021   ALT 35 (H) 11/07/2021   ANIONGAP 16 (H) 09/10/2017   Last lipids Lab Results  Component Value Date   CHOL 162 11/07/2021   HDL 47 11/07/2021   LDLCALC 81 11/07/2021   TRIG 204 (H) 11/07/2021   CHOLHDL 3.4 11/07/2021   Last hemoglobin A1c Lab Results  Component Value Date   HGBA1C 7.3 (A) 08/25/2022   The 10-year ASCVD risk score (Arnett DK, et al., 2019) is: 4.4%  EKG: NSR, non-ishemic, unchanged from previous    Assessment & Plan:    Problem List Items Addressed This Visit       Cardiovascular and Mediastinum   Hypertension associated with diabetes (HCC)    Well controlled without any medications  In office BP well controlled at 135 / 79 today       Relevant Medications   Dapagliflozin Pro-metFORMIN ER (XIGDUO XR) 07-998 MG TB24   rosuvastatin (CRESTOR) 5 MG tablet   tirzepatide (MOUNJARO) 7.5 MG/0.5ML Pen   Other Relevant Orders   Comprehensive metabolic panel     Endocrine   Diabetes mellitus (HCC) - Primary    Relatively well controlled  Last A1c was 7.3 Currently taking Mounjaro 7.5 mg - consider increase to 10mg  pending A1c Continue current medications  In contact with pharmacist for medication assistance       Relevant Medications   Dapagliflozin Pro-metFORMIN ER (XIGDUO XR) 07-998 MG TB24   rosuvastatin (CRESTOR) 5 MG tablet   tirzepatide (MOUNJARO) 7.5 MG/0.5ML Pen   Other Relevant Orders   Hemoglobin A1c   Thyroid nodule    Chronic  Asymptomatic  Recheck TSH      Relevant Orders   TSH   Hyperlipidemia associated with type 2 diabetes mellitus (HCC)    Well controlled at last reading  Recheck lipid panel       Relevant Medications   Dapagliflozin Pro-metFORMIN ER (XIGDUO XR) 07-998 MG TB24   rosuvastatin (CRESTOR) 5 MG tablet   tirzepatide (MOUNJARO) 7.5 MG/0.5ML Pen   Other Relevant Orders   Comprehensive metabolic panel   Lipid panel     Other   Severe recurrent major depression without psychotic features (HCC)    Chronic  Worsening due to mothers recent cancer diagnosis and hospitalization  Continue Celexa 40 mg daily  Previously followed by psych      Relevant Medications   citalopram (CELEXA) 40 MG tablet   B12 deficiency    Well controlled at last reading  Recheck B12      Relevant Orders   B12   Morbid obesity (HCC)    Discussed importance of lifestyle modifications  Continue Xigduo daily  Continue mounjaro 7.5 mg weekly       Relevant Medications    Dapagliflozin Pro-metFORMIN ER (XIGDUO XR) 07-998 MG TB24   tirzepatide (MOUNJARO) 7.5 MG/0.5ML Pen   Chronic bilateral low back pain without sciatica    Continue meloxicam 15 mg prn for pain Continue celexa 40 mg daily       Relevant Medications   citalopram (CELEXA) 40 MG tablet   meloxicam (MOBIC) 15 MG tablet   Avitaminosis D    Well controlled at least reading  Recheck Vit D 25 hydroxy       Relevant Orders   VITAMIN D 25 Hydroxy (Vit-D Deficiency, Fractures)   Leukocytosis    History  of leukocytosis  Recheck CBC w/ diff       Relevant Orders   CBC w/Diff/Platelet   Other chest pain    New Episodic chest pain localized on the R side that occurs for a few seconds at a time Does not have typical anginal symptoms  EKG conducted and is normal  Chest pain is unlikely to be related to a problem with the heart      Relevant Orders   EKG 12-Lead   Other Visit Diagnoses     Flu vaccine need       Relevant Orders   Flu vaccine trivalent PF, 6mos and older(Flulaval,Afluria,Fluarix,Fluzone) (Completed)      Return in about 6 months (around 06/02/2023) for AWV, chronic disease f/u.   Rometta Emery, Medical Student  Total time spent on today's visit was greater than 40 minutes, including both face-to-face time and nonface-to-face time personally spent on review of chart (labs and imaging), discussing labs and goals, discussing further work-up, treatment options, answering patient's questions, and coordinating care.   Patient seen along with MS3 student Jodi Marble. I personally evaluated this patient along with the student, and verified all aspects of the history, physical exam, and medical decision making as documented by the student. I agree with the student's documentation and have made all necessary edits.  Rise Traeger, Marzella Schlein, MD, MPH [facility]

## 2022-12-02 NOTE — Assessment & Plan Note (Signed)
Well controlled at least reading  Recheck Vit D 25 hydroxy

## 2022-12-02 NOTE — Assessment & Plan Note (Signed)
Chronic  Asymptomatic  Recheck TSH

## 2022-12-02 NOTE — Assessment & Plan Note (Addendum)
History of leukocytosis  Recheck CBC w/ diff

## 2022-12-02 NOTE — Assessment & Plan Note (Addendum)
Relatively well controlled  Last A1c was 7.3 Currently taking Mounjaro 7.5 mg - consider increase to 10mg  pending A1c Continue current medications  In contact with pharmacist for medication assistance

## 2022-12-02 NOTE — Assessment & Plan Note (Addendum)
New Episodic chest pain localized on the R side that occurs for a few seconds at a time Does not have typical anginal symptoms  EKG conducted and is normal  Chest pain is unlikely to be related to a problem with the heart

## 2022-12-02 NOTE — Assessment & Plan Note (Signed)
Continue meloxicam 15 mg prn for pain Continue celexa 40 mg daily

## 2022-12-02 NOTE — Assessment & Plan Note (Addendum)
Well controlled at last reading  Recheck lipid panel

## 2022-12-02 NOTE — Assessment & Plan Note (Addendum)
Well controlled at last reading  Recheck B12

## 2022-12-02 NOTE — Assessment & Plan Note (Signed)
Discussed importance of lifestyle modifications  Continue Xigduo daily  Continue mounjaro 7.5 mg weekly

## 2022-12-02 NOTE — Assessment & Plan Note (Addendum)
Well controlled without any medications  In office BP well controlled at 135 / 79 today

## 2022-12-02 NOTE — Assessment & Plan Note (Addendum)
Chronic  Worsening due to mothers recent cancer diagnosis and hospitalization  Continue Celexa 40 mg daily  Previously followed by psych

## 2022-12-03 LAB — CBC WITH DIFFERENTIAL/PLATELET
Basophils Absolute: 0.1 10*3/uL (ref 0.0–0.2)
Basos: 1 %
EOS (ABSOLUTE): 0.3 10*3/uL (ref 0.0–0.4)
Eos: 3 %
Hematocrit: 46.3 % (ref 34.0–46.6)
Hemoglobin: 15.1 g/dL (ref 11.1–15.9)
Immature Grans (Abs): 0 10*3/uL (ref 0.0–0.1)
Immature Granulocytes: 0 %
Lymphocytes Absolute: 4 10*3/uL — ABNORMAL HIGH (ref 0.7–3.1)
Lymphs: 36 %
MCH: 29.2 pg (ref 26.6–33.0)
MCHC: 32.6 g/dL (ref 31.5–35.7)
MCV: 90 fL (ref 79–97)
Monocytes Absolute: 0.6 10*3/uL (ref 0.1–0.9)
Monocytes: 6 %
Neutrophils Absolute: 5.9 10*3/uL (ref 1.4–7.0)
Neutrophils: 54 %
Platelets: 394 10*3/uL (ref 150–450)
RBC: 5.17 x10E6/uL (ref 3.77–5.28)
RDW: 12.1 % (ref 11.7–15.4)
WBC: 10.9 10*3/uL — ABNORMAL HIGH (ref 3.4–10.8)

## 2022-12-03 LAB — COMPREHENSIVE METABOLIC PANEL
ALT: 36 [IU]/L — ABNORMAL HIGH (ref 0–32)
AST: 42 [IU]/L — ABNORMAL HIGH (ref 0–40)
Albumin: 4.5 g/dL (ref 3.8–4.9)
Alkaline Phosphatase: 74 [IU]/L (ref 44–121)
BUN/Creatinine Ratio: 13 (ref 9–23)
BUN: 10 mg/dL (ref 6–24)
Bilirubin Total: 0.3 mg/dL (ref 0.0–1.2)
CO2: 20 mmol/L (ref 20–29)
Calcium: 9.8 mg/dL (ref 8.7–10.2)
Chloride: 100 mmol/L (ref 96–106)
Creatinine, Ser: 0.8 mg/dL (ref 0.57–1.00)
Globulin, Total: 2.7 g/dL (ref 1.5–4.5)
Glucose: 171 mg/dL — ABNORMAL HIGH (ref 70–99)
Potassium: 4.8 mmol/L (ref 3.5–5.2)
Sodium: 140 mmol/L (ref 134–144)
Total Protein: 7.2 g/dL (ref 6.0–8.5)
eGFR: 86 mL/min/{1.73_m2} (ref >59)

## 2022-12-03 LAB — TSH: TSH: 0.985 u[IU]/mL (ref 0.450–4.500)

## 2022-12-03 LAB — HEMOGLOBIN A1C
Est. average glucose Bld gHb Est-mCnc: 169 mg/dL
Hgb A1c MFr Bld: 7.5 % — ABNORMAL HIGH (ref 4.8–5.6)

## 2022-12-03 LAB — LIPID PANEL
Chol/HDL Ratio: 2.9 {ratio} (ref 0.0–4.4)
Cholesterol, Total: 163 mg/dL (ref 100–199)
HDL: 56 mg/dL (ref >39)
LDL Chol Calc (NIH): 76 mg/dL (ref 0–99)
Triglycerides: 183 mg/dL — ABNORMAL HIGH (ref 0–149)
VLDL Cholesterol Cal: 31 mg/dL (ref 5–40)

## 2022-12-03 LAB — VITAMIN B12: Vitamin B-12: 285 pg/mL (ref 232–1245)

## 2022-12-03 LAB — VITAMIN D 25 HYDROXY (VIT D DEFICIENCY, FRACTURES): Vit D, 25-Hydroxy: 55.6 ng/mL (ref 30.0–100.0)

## 2022-12-04 ENCOUNTER — Other Ambulatory Visit: Payer: Self-pay

## 2022-12-04 DIAGNOSIS — R7989 Other specified abnormal findings of blood chemistry: Secondary | ICD-10-CM

## 2022-12-04 MED ORDER — TIRZEPATIDE 10 MG/0.5ML ~~LOC~~ SOAJ: 10.0000 mg | SUBCUTANEOUS | 1 refills | Status: DC

## 2022-12-24 ENCOUNTER — Encounter: Payer: Self-pay | Admitting: Family Medicine

## 2022-12-25 ENCOUNTER — Encounter: Payer: Self-pay | Admitting: Family Medicine

## 2022-12-25 DIAGNOSIS — R110 Nausea: Secondary | ICD-10-CM

## 2022-12-26 MED ORDER — ONDANSETRON 4 MG PO TBDP: 4.0000 mg | ORAL_TABLET | Freq: Three times a day (TID) | ORAL | 0 refills | Status: DC | PRN

## 2022-12-29 MED ORDER — ONDANSETRON HCL 4 MG PO TABS: 4.0000 mg | ORAL_TABLET | Freq: Three times a day (TID) | ORAL | 0 refills | Status: DC | PRN

## 2023-01-01 LAB — COMPREHENSIVE METABOLIC PANEL
ALT: 27 [IU]/L (ref 0–32)
AST: 33 [IU]/L (ref 0–40)
Albumin: 4.3 g/dL (ref 3.8–4.9)
Alkaline Phosphatase: 71 [IU]/L (ref 44–121)
BUN/Creatinine Ratio: 9 (ref 9–23)
BUN: 7 mg/dL (ref 6–24)
Bilirubin Total: 0.3 mg/dL (ref 0.0–1.2)
CO2: 20 mmol/L (ref 20–29)
Calcium: 9.4 mg/dL (ref 8.7–10.2)
Chloride: 101 mmol/L (ref 96–106)
Creatinine, Ser: 0.76 mg/dL (ref 0.57–1.00)
Globulin, Total: 2.4 g/dL (ref 1.5–4.5)
Glucose: 212 mg/dL — ABNORMAL HIGH (ref 70–99)
Potassium: 4.4 mmol/L (ref 3.5–5.2)
Sodium: 141 mmol/L (ref 134–144)
Total Protein: 6.7 g/dL (ref 6.0–8.5)
eGFR: 92 mL/min/{1.73_m2} (ref >59)

## 2023-01-05 ENCOUNTER — Other Ambulatory Visit: Payer: Self-pay | Admitting: Family Medicine

## 2023-01-06 NOTE — Telephone Encounter (Signed)
[facility] received refil 12/02/22. Requested Prescriptions  Refused Prescriptions Disp Refills   rosuvastatin (CRESTOR) 5 MG tablet [Pharmacy Med Name: ROSUVASTATIN 5MG  TABLETS] 90 tablet 1    Sig: TAKE 1 TABLET(5 MG) BY MOUTH DAILY     Cardiovascular:  Antilipid - Statins 2 Failed - 01/05/2023  2:23 PM      Failed - Lipid Panel in normal range within the last 12 months    Cholesterol, Total  Date Value Ref Range Status  12/02/2022 163 100 - 199 mg/dL Final   LDL Chol Calc (NIH)  Date Value Ref Range Status  12/02/2022 76 0 - 99 mg/dL Final   HDL  Date Value Ref Range Status  12/02/2022 56 >39 mg/dL Final   Triglycerides  Date Value Ref Range Status  12/02/2022 183 (H) 0 - 149 mg/dL Final         Passed - Cr in normal range and within 360 days    Creatinine  Date Value Ref Range Status  06/16/2013 0.71 0.60 - 1.30 mg/dL Final   Creatinine, Ser  Date Value Ref Range Status  12/31/2022 0.76 0.57 - 1.00 mg/dL Final         Passed - Patient is not pregnant      Passed - Valid encounter within last 12 months    Recent Outpatient Visits           1 month ago Type 2 diabetes mellitus with other specified complication, with long-term current use of insulin (HCC)   [place] [facility] [place], Marzella Schlein, MD   4 months ago Type 2 diabetes mellitus with other specified complication, with long-term current use of insulin [facility])   [place] [facility] [place], Marzella Schlein, MD   8 months ago Type 2 diabetes mellitus with other specified complication, with long-term current use of insulin [facility])   [place] [facility] Jacky Kindle, FNP   11 months ago [facility] to [employer] preventive visit   [facility] Merita Norton T, FNP   11 months ago Type 2 diabetes mellitus with other specified complication, with long-term current use of insulin [facility])   [facility] Jacky Kindle, [place]

## 2023-02-02 ENCOUNTER — Other Ambulatory Visit: Payer: Self-pay | Admitting: Family Medicine

## 2023-02-02 DIAGNOSIS — R0602 Shortness of breath: Secondary | ICD-10-CM

## 2023-03-05 ENCOUNTER — Other Ambulatory Visit: Payer: Self-pay | Admitting: Family Medicine

## 2023-03-05 DIAGNOSIS — G8929 Other chronic pain: Secondary | ICD-10-CM

## 2023-03-09 NOTE — Telephone Encounter (Signed)
 Requested Prescriptions  Pending Prescriptions Disp Refills   meloxicam  (MOBIC ) 15 MG tablet [Pharmacy Med Name: MELOXICAM  15MG  TABLETS] 90 tablet 0    Sig: TAKE 1 TABLET(15 MG) BY MOUTH DAILY     Analgesics:  COX2 Inhibitors Failed - 03/09/2023 12:29 PM      Failed - Manual Review: Labs are only required if the patient has taken medication for more than 8 weeks.      Passed - HGB in normal range and within 360 days    Hemoglobin  Date Value Ref Range Status  12/02/2022 15.1 11.1 - 15.9 g/dL Final         Passed - Cr in normal range and within 360 days    Creatinine  Date Value Ref Range Status  06/16/2013 0.71 0.60 - 1.30 mg/dL Final   Creatinine, Ser  Date Value Ref Range Status  12/31/2022 0.76 0.57 - 1.00 mg/dL Final         Passed - HCT in normal range and within 360 days    Hematocrit  Date Value Ref Range Status  12/02/2022 46.3 34.0 - 46.6 % Final         Passed - AST in normal range and within 360 days    AST  Date Value Ref Range Status  12/31/2022 33 0 - 40 IU/L Final   SGOT(AST)  Date Value Ref Range Status  01/17/2013 16 15 - 37 Unit/L Final         Passed - ALT in normal range and within 360 days    ALT  Date Value Ref Range Status  12/31/2022 27 0 - 32 IU/L Final   SGPT (ALT)  Date Value Ref Range Status  01/17/2013 34 12 - 78 U/L Final         Passed - eGFR is 30 or above and within 360 days    EGFR (African American)  Date Value Ref Range Status  06/16/2013 >60  Final   GFR calc Af Amer  Date Value Ref Range Status  06/23/2019 111 >59 mL/min/1.73 Final   EGFR (Non-African Amer.)  Date Value Ref Range Status  06/16/2013 >60  Final    Comment:    eGFR values <74mL/min/1.73 m2 may be an indication of chronic kidney disease (CKD). Calculated eGFR is useful in patients with stable renal function. The eGFR calculation will not be reliable in acutely ill patients when serum creatinine is changing rapidly. It is not useful in  patients on  dialysis. The eGFR calculation may not be applicable to patients at the low and high extremes of body sizes, pregnant women, and vegetarians. POTASSIUM - Slight hemolysis, interpret results with  - caution.    GFR calc non Af Amer  Date Value Ref Range Status  06/23/2019 97 >59 mL/min/1.73 Final   eGFR  Date Value Ref Range Status  12/31/2022 92 >59 mL/min/1.73 Final         Passed - Patient is not pregnant      Passed - Valid encounter within last 12 months    Recent Outpatient Visits           3 months ago Type 2 diabetes mellitus with other specified complication, with long-term current use of insulin  [facility])   [place] [facility] [place], Jon HERO, MD   6 months ago Type 2 diabetes mellitus with other specified complication, with long-term current use of insulin  [facility])   [place] [facility] [place], Jon HERO, MD  10 months ago Type 2 diabetes mellitus with other specified complication, with long-term current use of insulin  [facility])   [place] [facility] Emilio Kelly DASEN, FNP   1 year ago Welcome to [employer] preventive visit   [facility] Emilio Kelly T, FNP   1 year ago Type 2 diabetes mellitus with other specified complication, with long-term current use of insulin  [facility])   [facility] Emilio Kelly DASEN, FNP

## 2023-03-18 ENCOUNTER — Telehealth: Payer: Self-pay | Admitting: Family Medicine

## 2023-03-18 NOTE — Telephone Encounter (Signed)
 LRF 12/04/22 #30mL 1Rf  6 month rx

## 2023-03-18 NOTE — Telephone Encounter (Signed)
 [facility] pharmacy is requesting prescription refill tirzepatide  (MOUNJARO ) 10 MG/0.5ML Pen   Please advise

## 2023-03-20 ENCOUNTER — Telehealth: Payer: Self-pay | Admitting: Family Medicine

## 2023-03-20 NOTE — Telephone Encounter (Signed)
Pt is calling to report that tirzepatide [facility]) 10 MG/0.5ML Pen [[id number]] is needing PA. Please advise CB- [phone]

## 2023-03-25 ENCOUNTER — Telehealth: Payer: Self-pay | Admitting: Family Medicine

## 2023-03-25 NOTE — Telephone Encounter (Signed)
[facility] pharmacy is requesting refill tirzepatide (MOUNJARO) 7.5 MG/0.5ML Pen  Please advise

## 2023-03-26 NOTE — Telephone Encounter (Signed)
Is she on 10 mg or 7.5mg . Pharmacy send a request for the 7.5. Another message says patient called to report Mounjaro 10mg  is needing a PA.

## 2023-03-26 NOTE — Telephone Encounter (Signed)
Should be 10mg  dose of mounjaro - see last A1c result note

## 2023-03-27 NOTE — Telephone Encounter (Signed)
Recieved a fax from covermymeds for Mounjaro 10mg /0.5ML Auto-injectors..Has been started   key: [id number]

## 2023-03-30 NOTE — Telephone Encounter (Signed)
(   [facility]) PA needed for [employer] 10mg /0.5ML INJ (4Pens)- Administer10mg  under the skin 1 time a week.

## 2023-03-30 NOTE — Telephone Encounter (Signed)
Error

## 2023-04-01 NOTE — Telephone Encounter (Signed)
PA initiated, notes faxed

## 2023-04-17 LAB — HM DIABETES EYE EXAM

## 2023-04-24 ENCOUNTER — Other Ambulatory Visit: Payer: Self-pay | Admitting: Family Medicine

## 2023-04-27 NOTE — Telephone Encounter (Signed)
 Requested Prescriptions  Pending Prescriptions Disp Refills   Dapagliflozin Pro-metFORMIN ER (XIGDUO XR) 07-998 MG TB24 [Pharmacy Med Name: XIGDUO XR 5MG /1000MG  TABLETS] 180 tablet 0    Sig: TAKE 1 TABLET BY MOUTH TWICE DAILY     Endocrinology:  Diabetes - Biguanide + SGLT2 Inhibitor Combos Passed - 04/27/2023 11:24 AM      Passed - Cr in normal range and within 360 days    Creatinine  Date Value Ref Range Status  06/16/2013 0.71 0.60 - 1.30 mg/dL Final   Creatinine, Ser  Date Value Ref Range Status  12/31/2022 0.76 0.57 - 1.00 mg/dL Final         Passed - HBA1C is between 0 and 7.9 and within 180 days    Hgb A1c MFr Bld  Date Value Ref Range Status  12/02/2022 7.5 (H) 4.8 - 5.6 % Final    Comment:             Prediabetes: 5.7 - 6.4          Diabetes: >6.4          Glycemic control for adults with diabetes: <7.0          Passed - eGFR in normal range and within 360 days    EGFR (African American)  Date Value Ref Range Status  06/16/2013 >60  Final   GFR calc Af Amer  Date Value Ref Range Status  06/23/2019 111 >59 mL/min/1.73 Final   EGFR (Non-African Amer.)  Date Value Ref Range Status  06/16/2013 >60  Final    Comment:    eGFR values <16mL/min/1.73 m2 may be an indication of chronic kidney disease (CKD). Calculated eGFR is useful in patients with stable renal function. The eGFR calculation will not be reliable in acutely ill patients when serum creatinine is changing rapidly. It is not useful in  patients on dialysis. The eGFR calculation may not be applicable to patients at the low and high extremes of body sizes, pregnant women, and vegetarians. POTASSIUM - Slight hemolysis, interpret results with  - caution.    GFR calc non Af Amer  Date Value Ref Range Status  06/23/2019 97 >59 mL/min/1.73 Final   eGFR  Date Value Ref Range Status  12/31/2022 92 >59 mL/min/1.73 Final         Passed - B12 Level in normal range and within 720 days    Vitamin B-12   Date Value Ref Range Status  12/02/2022 285 232 - 1,245 pg/mL Final         Passed - Valid encounter within last 6 months    Recent Outpatient Visits           4 months ago Type 2 diabetes mellitus with other specified complication, with long-term current use of insulin (HCC)   [facility] [place], Marzella Schlein, MD   8 months ago Type 2 diabetes mellitus with other specified complication, with long-term current use of insulin [facility])   [facility] [place], Marzella Schlein, MD   11 months ago Type 2 diabetes mellitus with other specified complication, with long-term current use of insulin [facility])   [facility] Merita Norton T, FNP   1 year ago [facility] to [employer] preventive visit   [facility] Merita Norton T, FNP   1 year ago Type 2 diabetes mellitus with other specified complication, with long-term current use of insulin [facility])   [facility] [place],  Daryl Eastern, FNP              Passed - CBC within normal limits and completed in the last 12 months    WBC  Date Value Ref Range Status  12/02/2022 10.9 (H) 3.4 - 10.8 x10E3/uL Final  09/10/2017 13.1 (H) 3.6 - 11.0 K/uL Final   RBC  Date Value Ref Range Status  12/02/2022 5.17 3.77 - 5.28 x10E6/uL Final  09/10/2017 5.49 (H) 3.80 - 5.20 MIL/uL Final   Hemoglobin  Date Value Ref Range Status  12/02/2022 15.1 11.1 - 15.9 g/dL Final   Hematocrit  Date Value Ref Range Status  12/02/2022 46.3 34.0 - 46.6 % Final   MCHC  Date Value Ref Range Status  12/02/2022 32.6 31.5 - 35.7 g/dL Final  30/86/5784 69.6 32.0 - 36.0 g/dL Final   [facility]  Date Value Ref Range Status  12/02/2022 29.2 26.6 - 33.0 pg Final  09/10/2017 30.7 26.0 - 34.0 pg Final   MCV  Date Value Ref Range Status  12/02/2022 90 79 - 97 fL Final  01/17/2013 88 80 - 100 fL Final   No results found for: "PLTCOUNTKUC", "LABPLAT", "POCPLA" RDW   Date Value Ref Range Status  12/02/2022 12.1 11.7 - 15.4 % Final  01/17/2013 13.1 11.5 - 14.5 % Final

## 2023-06-08 ENCOUNTER — Encounter: Payer: Self-pay | Admitting: Family Medicine

## 2023-06-08 ENCOUNTER — Ambulatory Visit (INDEPENDENT_AMBULATORY_CARE_PROVIDER_SITE_OTHER): Payer: Medicare Other | Admitting: Family Medicine

## 2023-06-08 VITALS — BP 109/74 | HR 86 | Resp 16 | Ht 70.0 in | Wt 248.0 lb

## 2023-06-08 DIAGNOSIS — E1169 Type 2 diabetes mellitus with other specified complication: Secondary | ICD-10-CM

## 2023-06-08 DIAGNOSIS — E041 Nontoxic single thyroid nodule: Secondary | ICD-10-CM

## 2023-06-08 DIAGNOSIS — M545 Low back pain, unspecified: Secondary | ICD-10-CM | POA: Diagnosis not present

## 2023-06-08 DIAGNOSIS — E1159 Type 2 diabetes mellitus with other circulatory complications: Secondary | ICD-10-CM

## 2023-06-08 DIAGNOSIS — I152 Hypertension secondary to endocrine disorders: Secondary | ICD-10-CM

## 2023-06-08 DIAGNOSIS — Z23 Encounter for immunization: Secondary | ICD-10-CM

## 2023-06-08 DIAGNOSIS — Z0000 Encounter for general adult medical examination without abnormal findings: Secondary | ICD-10-CM | POA: Diagnosis not present

## 2023-06-08 DIAGNOSIS — Z794 Long term (current) use of insulin: Secondary | ICD-10-CM

## 2023-06-08 DIAGNOSIS — Z1231 Encounter for screening mammogram for malignant neoplasm of breast: Secondary | ICD-10-CM

## 2023-06-08 DIAGNOSIS — G8929 Other chronic pain: Secondary | ICD-10-CM

## 2023-06-08 DIAGNOSIS — E538 Deficiency of other specified B group vitamins: Secondary | ICD-10-CM

## 2023-06-08 DIAGNOSIS — F332 Major depressive disorder, recurrent severe without psychotic features: Secondary | ICD-10-CM | POA: Diagnosis not present

## 2023-06-08 DIAGNOSIS — E559 Vitamin D deficiency, unspecified: Secondary | ICD-10-CM

## 2023-06-08 DIAGNOSIS — E785 Hyperlipidemia, unspecified: Secondary | ICD-10-CM

## 2023-06-08 DIAGNOSIS — R110 Nausea: Secondary | ICD-10-CM

## 2023-06-08 MED ORDER — DAPAGLIFLOZIN PRO-METFORMIN ER 5-1000 MG PO TB24: 1.0000 | ORAL_TABLET | Freq: Two times a day (BID) | ORAL | 1 refills | Status: AC

## 2023-06-08 MED ORDER — GABAPENTIN 300 MG PO CAPS: 600.0000 mg | ORAL_CAPSULE | Freq: Two times a day (BID) | ORAL | 5 refills | Status: AC

## 2023-06-08 MED ORDER — CITALOPRAM HYDROBROMIDE 40 MG PO TABS: 40.0000 mg | ORAL_TABLET | Freq: Every day | ORAL | 1 refills | Status: AC

## 2023-06-08 MED ORDER — MELOXICAM 15 MG PO TABS
ORAL_TABLET | ORAL | 1 refills | Status: DC
Start: 2023-06-08 — End: 2023-09-17

## 2023-06-08 MED ORDER — ROSUVASTATIN CALCIUM 5 MG PO TABS: 5.0000 mg | ORAL_TABLET | Freq: Every day | ORAL | 1 refills | Status: DC

## 2023-06-08 MED ORDER — ONDANSETRON 4 MG PO TBDP: 4.0000 mg | ORAL_TABLET | Freq: Three times a day (TID) | ORAL | 0 refills | Status: DC | PRN

## 2023-06-08 MED ORDER — TIRZEPATIDE 12.5 MG/0.5ML ~~LOC~~ SOAJ: 12.5000 mg | SUBCUTANEOUS | 1 refills | Status: DC

## 2023-06-08 NOTE — Assessment & Plan Note (Signed)
Well controlled Continue current medications Recheck metabolic panel 

## 2023-06-08 NOTE — Assessment & Plan Note (Signed)
 Increased zepbound as above

## 2023-06-08 NOTE — Assessment & Plan Note (Signed)
 She has depression and discontinued Spravato after her mother's passing in December. She is considering restarting Spravato treatment and requires a referral to [facility] in [place]. Her daughter will assist with transportation for treatment sessions. - Send referral to [facility] in [place] for [place] treatment.

## 2023-06-08 NOTE — Assessment & Plan Note (Signed)
 She is on Crestor for hyperlipidemia management and requested a refill. - Send refill for Crestor to [facility] in [place].

## 2023-06-08 NOTE — Progress Notes (Signed)
 Annual Wellness Visit     Patient: Bonnie Garcia, Female    DOB: 04-17-1965, 58 y.o.   MRN: [id number] Visit Date: 06/08/2023  Today's Provider: Shirlee Latch, MD   Chief Complaint  Patient presents with   Medicare Wellness   Subjective    Bonnie Garcia is a 58 y.o. female who presents today for her Annual Wellness Visit.  Discussed the use of AI scribe software for clinical note transcription with the patient, who gave verbal consent to proceed.  History of Present Illness   The patient, with a history of diabetes and depression, presents for a Medicare wellness visit. The patient reports a recent increase in depressive symptoms following the death of her mother in Mar 09, 2024. The patient had been receiving Spravato treatments for depression but had to stop due to lack of transportation.  The patient also reports ongoing gastrointestinal issues, describing a pattern of alternating constipation and diarrhea. The patient has been using Senna and stool softeners to manage these symptoms but finds them to be of limited effectiveness. The patient also reports a sensation of fullness and nausea during meals, which she attributes to her Garcia loss medication, Mounjaro.  The patient also mentions a recent foot injury, which has resulted in a sprain and persistent pain. The patient has been seeing a doctor for this issue but reports that it is not improving. The patient also reports tingling and burning sensations in her feet at night, raising concerns about potential neuropathy.  The patient also reports hair loss, which she is unsure of the cause. She wonders if it could be related to menopause, thyroid issues, or her Garcia loss medication.             Medications: Outpatient Medications Prior to Visit  Medication Sig   albuterol (VENTOLIN HFA) 108 (90 Base) MCG/ACT inhaler INHALE 1 TO 2 PUFFS INTO THE LUNGS EVERY 6 HOURS AS NEEDED FOR WHEEZING OR SHORTNESS OF  BREATH   amphetamine-dextroamphetamine (ADDERALL) 20 MG tablet Take 20 mg by mouth 3 (three) times daily.   aspirin EC 81 MG tablet Take 81 mg by mouth daily.   B Complex Vitamins (B COMPLEX PO) Take by mouth.   Blood Glucose Monitoring Suppl (ACCU-CHEK AVIVA PLUS) w/Device KIT 1 Device by Does not apply route 3 (three) times daily.   Cholecalciferol 25 MCG (1000 UT) capsule Take 1,000 Units by mouth daily.   clonazePAM (KLONOPIN) 1 MG tablet Take 1 mg by mouth at bedtime as needed. Take as needed   EVENING PRIMROSE OIL PO Take by mouth.   glucose blood test strip Use as instructed to test blood sugar 2-3 times daily   glucose blood test strip Use to check blood sugar 2-3 times daily.   Insulin Pen Needle (B-D UF III MINI PEN NEEDLES) 31G X 5 MM MISC Use as directed to inject ozempic weekly   LORazepam (ATIVAN) 1 MG tablet TK 1 T PO BID UTD FOR PANIC SYMPTOMS   sucralfate (CARAFATE) 1 g tablet Take 1 tablet (1 g total) by mouth 4 (four) times daily -  with meals and at bedtime. (Patient taking differently: Take 1 g by mouth 4 (four) times daily -  with meals and at bedtime. Takes as needed)   traZODone (DESYREL) 150 MG tablet Take 150 mg by mouth at bedtime.   [DISCONTINUED] citalopram (CELEXA) 40 MG tablet Take 1 tablet (40 mg total) by mouth daily.   [DISCONTINUED] Dapagliflozin Pro-metFORMIN ER (XIGDUO XR) 07-998  MG TB24 TAKE 1 TABLET BY MOUTH TWICE DAILY   [DISCONTINUED] gabapentin (NEURONTIN) 300 MG capsule Take 300 mg by mouth 3 (three) times daily.    [DISCONTINUED] ondansetron (ZOFRAN) 4 MG tablet Take 1 tablet (4 mg total) by mouth every 8 (eight) hours as needed for nausea or vomiting.   [DISCONTINUED] ondansetron (ZOFRAN-ODT) 4 MG disintegrating tablet Take 1 tablet (4 mg total) by mouth every 8 (eight) hours as needed for nausea or vomiting.   [DISCONTINUED] rosuvastatin (CRESTOR) 5 MG tablet Take 1 tablet (5 mg total) by mouth daily.   [DISCONTINUED] tirzepatide (MOUNJARO) 10  MG/0.5ML Pen Inject 10 mg into the skin once a week.   SPRAVATO, 56 MG DOSE, 28 MG/DEVICE SOPK 1 Dose 2 (two) times a week. (Patient not taking: Reported on 06/08/2023)   [DISCONTINUED] meloxicam (MOBIC) 15 MG tablet TAKE 1 TABLET(15 MG) BY MOUTH DAILY (Patient not taking: Reported on 06/08/2023)   [DISCONTINUED] tirzepatide (MOUNJARO) 7.5 MG/0.5ML Pen Inject 7.5 mg into the skin once a week.   No facility-administered medications prior to visit.    Allergies  Allergen Reactions   Erythromycin Other (See Comments)    Reaction:  GI upset    Penicillins Hives and Other (See Comments)    Has patient had a PCN reaction causing immediate rash, facial/tongue/throat swelling, SOB or lightheadedness with hypotension: No Has patient had a PCN reaction causing severe rash involving mucus membranes or skin necrosis: No Has patient had a PCN reaction that required hospitalization No Has patient had a PCN reaction occurring within the last 10 years: No If all of the above answers are "NO", then may proceed with Cephalosporin use.    Patient Care Team: Erasmo Downer, MD as PCP - General (Family Medicine)  Review of Systems       Objective    Vitals: BP 109/74 Comment: home reading  Pulse 86   Resp 16   Ht 5\' 10"  (1.778 m)   Wt 248 lb (112.5 kg)   SpO2 98%   BMI 35.58 kg/m      Physical Exam Vitals reviewed.  Constitutional:      General: She is not in acute distress.    Appearance: Normal appearance. She is well-developed. She is not diaphoretic.  HENT:     Head: Normocephalic and atraumatic.     Right Ear: Tympanic membrane, ear canal and external ear normal.     Left Ear: Tympanic membrane, ear canal and external ear normal.     Nose: Nose normal.     Mouth/Throat:     Mouth: Mucous membranes are moist.     Pharynx: Oropharynx is clear. No oropharyngeal exudate.  Eyes:     General: No scleral icterus.    Conjunctiva/sclera: Conjunctivae normal.     Pupils: Pupils are  equal, round, and reactive to light.  Neck:     Thyroid: No thyromegaly.  Cardiovascular:     Rate and Rhythm: Normal rate and regular rhythm.     Heart sounds: Normal heart sounds. No murmur heard. Pulmonary:     Effort: Pulmonary effort is normal. No respiratory distress.     Breath sounds: Normal breath sounds. No wheezing or rales.  Abdominal:     General: There is no distension.     Palpations: Abdomen is soft.     Tenderness: There is no abdominal tenderness.  Musculoskeletal:        General: No deformity.     Cervical back: Neck supple.  Right lower leg: No edema.     Left lower leg: No edema.  Lymphadenopathy:     Cervical: No cervical adenopathy.  Skin:    General: Skin is warm and dry.     Findings: No rash.  Neurological:     Mental Status: She is alert and oriented to person, place, and time. Mental status is at baseline.     Gait: Gait normal.  Psychiatric:        Mood and Affect: Mood normal.        Behavior: Behavior normal.        Thought Content: Thought content normal.     Most recent functional status assessment:    06/08/2023    3:51 PM  In your present state of health, do you have any difficulty performing the following activities:  Hearing? 1  Vision? 0  Difficulty concentrating or making decisions? 0  Walking or climbing stairs? 1  Dressing or bathing? 0  Doing errands, shopping? 0   Most recent fall risk assessment:    06/08/2023    3:52 PM  Fall Risk   Falls in the past year? 0  Number falls in past yr: 0  Injury with Fall? 0  Risk for fall due to : No Fall Risks    Most recent depression screenings:    06/08/2023    3:52 PM 12/02/2022    4:00 PM  PHQ 2/9 Scores  PHQ - 2 Score 6 5  PHQ- 9 Score 20 19   Most recent cognitive screening:    06/08/2023    3:54 PM  6CIT Screen  What Year? 0 points  What month? 0 points  What time? 0 points  Count back from 20 0 points  Months in reverse 0 points  Repeat phrase 2 points  Total  Score 2 points   Most recent Audit-C alcohol use screening    06/07/2023    8:30 PM  Alcohol Use Disorder Test (AUDIT)  1. How often do you have a drink containing alcohol? 1  2. How many drinks containing alcohol do you have on a typical day when you are drinking? 0  3. How often do you have six or more drinks on one occasion? 0  AUDIT-C Score 1      Patient-reported   A score of 3 or more in women, and 4 or more in men indicates increased risk for alcohol abuse, EXCEPT if all of the points are from question 1   No results found.  No results found for any visits on 06/08/23.  Assessment & Plan     Annual wellness visit done today including the all of the following: Reviewed patient's Family Medical History Reviewed and updated list of patient's medical providers Assessment of cognitive impairment was done Assessed patient's functional ability Established a written schedule for health screening services Health Risk Assessent Completed and Reviewed  Exercise Activities and Dietary recommendations  Goals      Pharmacy Goals     Our goal A1c is less than 7%. This corresponds with fasting sugars less than 130 and 2 hour after meal sugars less than 180. Please keep a log of your results when checking your blood sugar   Our goal bad cholesterol, or LDL, is less than 70. This is why it is important to continue taking your rosuvastatin.   Thank you!  Estelle Grumbles, PharmD, [facility] [phone]         Immunization History  Administered Date(s) Administered   Influenza, Seasonal, Injecte, Preservative Fre 12/02/2022   Influenza,inj,Quad PF,6+ Mos 10/21/2017, 12/02/2018, 12/12/2019   Influenza-Unspecified 02/07/2015, 12/31/2020   Moderna Sars-Covid-2 Vaccination 09/23/2019, 10/21/2019, 05/02/2020   PNEUMOCOCCAL CONJUGATE-20 06/08/2023   Pneumococcal Polysaccharide-23 10/21/2017   Tdap 04/22/2016   Zoster Recombinant(Shingrix) 07/31/2020,  01/03/2021    Health Maintenance  Topic Date Due   MAMMOGRAM  01/12/2022   COVID-19 Vaccine (4 - 2024-25 season) 11/02/2022   Cervical Cancer Screening (HPV/Pap Cotest)  11/19/2022   HEMOGLOBIN A1C  06/02/2023   Diabetic kidney evaluation - Urine ACR  08/25/2023   INFLUENZA VACCINE  10/02/2023   Diabetic kidney evaluation - eGFR measurement  12/31/2023   OPHTHALMOLOGY EXAM  04/16/2024   FOOT EXAM  06/07/2024   Medicare Annual Wellness (AWV)  06/07/2024   Colonoscopy  07/22/2025   DTaP/Tdap/Td (2 - Td or Tdap) 04/22/2026   Pneumococcal Vaccine 18-51 Years old  Completed   Hepatitis C Screening  Completed   HIV Screening  Completed   Zoster Vaccines- Shingrix  Completed   HPV VACCINES  Aged Out     Discussed health benefits of physical activity, and encouraged her to engage in regular exercise appropriate for her age and condition.    Problem List Items Addressed This Visit       Cardiovascular and Mediastinum   Hypertension associated with diabetes (HCC)   Well controlled Continue current medications Recheck metabolic panel      Relevant Medications   tirzepatide (MOUNJARO) 12.5 MG/0.5ML Pen   Dapagliflozin Pro-metFORMIN ER (XIGDUO XR) 07-998 MG TB24   rosuvastatin (CRESTOR) 5 MG tablet   Other Relevant Orders   Comprehensive metabolic panel with GFR     Endocrine   Diabetes mellitus (HCC)   She is on Xigduo for diabetes management and requested a refill. - Send refill for Xigduo to [facility] in [place]. Due to insurance issues, she experienced a lapse in medication and is considering increasing the dose from 10 mg to 12.5 mg due to persistent food noise and uncontrolled depression. Potential side effects, such as constipation and hair loss, were discussed. She was advised to check with the hospital pharmacy if there are stock issues with [facility]. - Increase Mounjaro dose to 12.5 mg. - Advise to check with the hospital pharmacy if there are stock issues with  [facility].      Relevant Medications   tirzepatide (MOUNJARO) 12.5 MG/0.5ML Pen   Dapagliflozin Pro-metFORMIN ER (XIGDUO XR) 07-998 MG TB24   rosuvastatin (CRESTOR) 5 MG tablet   Other Relevant Orders   Hemoglobin A1c   CBC with Differential/Platelet   Pneumococcal conjugate vaccine 20-valent (Prevnar 20) (Completed)   Urine Microalbumin w/creat. ratio   Thyroid nodule   Relevant Orders   TSH   Hyperlipidemia associated with type 2 diabetes mellitus (HCC)   She is on Crestor for hyperlipidemia management and requested a refill. - Send refill [facility]rest[facility] to [facility] in [place].      Relevant Medications   tirzepatide (MOUNJARO) 12.5 MG/0.5ML Pen   Dapagliflozin Pro-metFORMIN ER (XIGDUO XR) 07-998 MG TB24   rosuvastatin (CRESTOR) 5 MG tablet   Other Relevant Orders   Lipid Panel With LDL/HDL Ratio   Comprehensive metabolic panel with GFR     Other   Severe recurrent major depression without psychotic features [facility])   She has depression and discontinued Spravato after her mother's passing in December. She is considering restarting Spravato treatment and requires a referral to [facility] in [place]. Her  daughter will assist with transportation for treatment sessions. - Send referral to [facility] in [place] for [place] treatment.      Relevant Medications   citalopram (CELEXA) 40 MG tablet   Other Relevant Orders   CBC with Differential/Platelet   Ambulatory referral to Psychiatry   B12 deficiency   Relevant Orders   B12   Morbid obesity (HCC)   Increased zepbound as above      Relevant Medications   tirzepatide (MOUNJARO) 12.5 MG/0.5ML Pen   Dapagliflozin Pro-metFORMIN ER (XIGDUO XR) 07-998 MG TB24   Other Relevant Orders   CBC with Differential/Platelet   Chronic bilateral low back pain without sciatica   Relevant Medications   meloxicam (MOBIC) 15 MG tablet   citalopram (CELEXA) 40 MG tablet   gabapentin (NEURONTIN) 300 MG capsule    Avitaminosis D   Relevant Orders   VITAMIN D 25 Hydroxy (Vit-D Deficiency, Fractures)   Breast cancer screening by mammogram   Relevant Orders   MM 3D SCREENING MAMMOGRAM BILATERAL BREAST   Other Visit Diagnoses       Encounter for annual wellness visit (AWV) in Medicare patient    -  Primary     Nausea       Relevant Medications   ondansetron (ZOFRAN-ODT) 4 MG disintegrating tablet     Immunization due       Relevant Orders   Pneumococcal conjugate vaccine 20-valent (Prevnar 20) (Completed)          Neuropathy She reports tingling and burning sensations in her feet, especially at night. She is on gabapentin 300 mg twice a day and is considering increasing the dose for better symptom control. The maximum daily dose of gabapentin is 3600 mg, providing room for dosage increase. - Increase gabapentin to 600 mg twice a day. - Advise to increase only the evening dose if drowsiness occurs.  Foot Injury She has a sprained ATFL in her left foot, which is not improving. She is concerned about potential neuropathy in both feet. A diabetic foot exam was performed to assess for neuropathy, and sensation was present. - Perform diabetic foot exam to assess for neuropathy.  Osteoarthritis She is on meloxicam for osteoarthritis management and requested a refill. - Send refill for [facility]am to [facility] in [place].  Irritable Bowel Syndrome (IBS) She reports symptoms consistent with mixed IBS, including alternating constipation and diarrhea, and abdominal discomfort. She is using Senna and stool softeners, which may not be effective. The benefits of fiber supplements and Miralax over Senna were discussed. - Recommend fiber supplement such as Metamucil or Benefiber. - Recommend Miralax daily instead of Senna for constipation management.  General Health Maintenance She is due for several routine health screenings and vaccinations. She is up to date with her colonoscopy but is due for a mammogram  and Pap smear. She also requires a pneumonia vaccination. The benefits of the new pneumonia vaccine, which provides lifelong protection, were discussed. - Order mammogram at the med center in [place]. - Schedule Pap smear for next visit. - Administer pneumonia vaccine. - Perform yearly urine test. - Order labs including kidney and liver function, cholesterol, A1c, thyroid, blood counts, B12, and  vitamin D.  Follow-up She requires follow-up to assess the effectiveness of medication adjustments and to complete pending health maintenance tasks. - Schedule follow-up appointment in three months.       Return in about 3 months (around 09/07/2023) for chronic disease f/u, with pap.     Shirlee Latch, MD  [facility] [phone] (phone) [phone] (fax)  [facility]

## 2023-06-08 NOTE — Assessment & Plan Note (Signed)
 She is on Xigduo for diabetes management and requested a refill. - Send refill for Xigduo to [facility] in [place]. Due to insurance issues, she experienced a lapse in medication and is considering increasing the dose from 10 mg to 12.5 mg due to persistent food noise and uncontrolled depression. Potential side effects, such as constipation and hair loss, were discussed. She was advised to check with the hospital pharmacy if there are stock issues with [facility]. - Increase Mounjaro dose to 12.5 mg. - Advise to check with the hospital pharmacy if there are stock issues with [facility].

## 2023-06-10 LAB — CBC WITH DIFFERENTIAL/PLATELET
Basophils Absolute: 0.1 10*3/uL (ref 0.0–0.2)
Basos: 1 %
EOS (ABSOLUTE): 0.3 10*3/uL (ref 0.0–0.4)
Eos: 3 %
Hematocrit: 47.2 % — ABNORMAL HIGH (ref 34.0–46.6)
Hemoglobin: 15.9 g/dL (ref 11.1–15.9)
Immature Grans (Abs): 0 10*3/uL (ref 0.0–0.1)
Immature Granulocytes: 0 %
Lymphocytes Absolute: 4.1 10*3/uL — ABNORMAL HIGH (ref 0.7–3.1)
Lymphs: 42 %
MCH: 29.5 pg (ref 26.6–33.0)
MCHC: 33.7 g/dL (ref 31.5–35.7)
MCV: 88 fL (ref 79–97)
Monocytes Absolute: 0.6 10*3/uL (ref 0.1–0.9)
Monocytes: 6 %
Neutrophils Absolute: 4.7 10*3/uL (ref 1.4–7.0)
Neutrophils: 48 %
Platelets: 351 10*3/uL (ref 150–450)
RBC: 5.39 x10E6/uL — ABNORMAL HIGH (ref 3.77–5.28)
RDW: 12.5 % (ref 11.7–15.4)
WBC: 9.9 10*3/uL (ref 3.4–10.8)

## 2023-06-10 LAB — TSH: TSH: 1.37 u[IU]/mL (ref 0.450–4.500)

## 2023-06-10 LAB — COMPREHENSIVE METABOLIC PANEL WITH GFR
ALT: 28 IU/L (ref 0–32)
AST: 33 IU/L (ref 0–40)
Albumin: 4.6 g/dL (ref 3.8–4.9)
Alkaline Phosphatase: 82 IU/L (ref 44–121)
BUN/Creatinine Ratio: 5 — ABNORMAL LOW (ref 9–23)
BUN: 4 mg/dL — ABNORMAL LOW (ref 6–24)
Bilirubin Total: 0.3 mg/dL (ref 0.0–1.2)
CO2: 20 mmol/L (ref 20–29)
Calcium: 9.5 mg/dL (ref 8.7–10.2)
Chloride: 102 mmol/L (ref 96–106)
Creatinine, Ser: 0.78 mg/dL (ref 0.57–1.00)
Globulin, Total: 2.4 g/dL (ref 1.5–4.5)
Glucose: 120 mg/dL — ABNORMAL HIGH (ref 70–99)
Potassium: 4.2 mmol/L (ref 3.5–5.2)
Sodium: 140 mmol/L (ref 134–144)
Total Protein: 7 g/dL (ref 6.0–8.5)
eGFR: 89 mL/min/{1.73_m2} (ref >59)

## 2023-06-10 LAB — LIPID PANEL WITH LDL/HDL RATIO
Cholesterol, Total: 147 mg/dL (ref 100–199)
HDL: 53 mg/dL (ref >39)
LDL Chol Calc (NIH): 63 mg/dL (ref 0–99)
LDL/HDL Ratio: 1.2 ratio (ref 0.0–3.2)
Triglycerides: 186 mg/dL — ABNORMAL HIGH (ref 0–149)
VLDL Cholesterol Cal: 31 mg/dL (ref 5–40)

## 2023-06-10 LAB — MICROALBUMIN / CREATININE URINE RATIO
Creatinine, Urine: 149.8 mg/dL
Microalb/Creat Ratio: 9 mg/g{creat} (ref 0–29)
Microalbumin, Urine: 13.4 ug/mL

## 2023-06-10 LAB — VITAMIN D 25 HYDROXY (VIT D DEFICIENCY, FRACTURES): Vit D, 25-Hydroxy: 78.6 ng/mL (ref 30.0–100.0)

## 2023-06-10 LAB — HEMOGLOBIN A1C
Est. average glucose Bld gHb Est-mCnc: 160 mg/dL
Hgb A1c MFr Bld: 7.2 % — ABNORMAL HIGH (ref 4.8–5.6)

## 2023-06-10 LAB — VITAMIN B12: Vitamin B-12: 207 pg/mL — ABNORMAL LOW (ref 232–1245)

## 2023-06-11 ENCOUNTER — Encounter: Payer: Self-pay | Admitting: Family Medicine

## 2023-08-05 ENCOUNTER — Other Ambulatory Visit: Payer: Self-pay | Admitting: Family Medicine

## 2023-08-10 ENCOUNTER — Other Ambulatory Visit: Payer: Self-pay

## 2023-08-10 ENCOUNTER — Telehealth: Payer: Self-pay | Admitting: Family Medicine

## 2023-08-10 NOTE — Telephone Encounter (Signed)
Converted to refill req

## 2023-08-10 NOTE — Telephone Encounter (Signed)
 [facility] is asking for refills on Xigduo  XR 5 mg/1000 mg. #180 with 3 refills

## 2023-08-30 ENCOUNTER — Other Ambulatory Visit: Payer: Self-pay

## 2023-08-30 ENCOUNTER — Emergency Department
Admission: EM | Admit: 2023-08-30 | Discharge: 2023-08-30 | Disposition: A | Discharge status: Home / Self Care | Attending: Emergency Medicine | Admitting: Emergency Medicine

## 2023-08-30 ENCOUNTER — Emergency Department

## 2023-08-30 DIAGNOSIS — S92512A Displaced fracture of proximal phalanx of left lesser toe(s), initial encounter for closed fracture: Secondary | ICD-10-CM | POA: Diagnosis not present

## 2023-08-30 DIAGNOSIS — Z23 Encounter for immunization: Secondary | ICD-10-CM | POA: Insufficient documentation

## 2023-08-30 DIAGNOSIS — S9032XA Contusion of left foot, initial encounter: Secondary | ICD-10-CM | POA: Diagnosis not present

## 2023-08-30 DIAGNOSIS — S99922A Unspecified injury of left foot, initial encounter: Secondary | ICD-10-CM | POA: Diagnosis present

## 2023-08-30 DIAGNOSIS — W208XXA Other cause of strike by thrown, projected or falling object, initial encounter: Secondary | ICD-10-CM | POA: Diagnosis not present

## 2023-08-30 DIAGNOSIS — S92502A Displaced unspecified fracture of left lesser toe(s), initial encounter for closed fracture: Secondary | ICD-10-CM

## 2023-08-30 MED ORDER — TETANUS-DIPHTH-ACELL PERTUSSIS 5-2.5-18.5 LF-MCG/0.5 IM SUSY
0.5000 mL | PREFILLED_SYRINGE | Freq: Once | INTRAMUSCULAR | Status: AC
  Administered 2023-08-30: 0.5 mL via INTRAMUSCULAR
  Filled 2023-08-30: qty 0.5

## 2023-08-30 NOTE — Discharge Instructions (Addendum)
 Continue taking your antibiotic until completely finished.  Follow-up with Dr. Ashley for your fracture second toe.  Wear cam walker boot to provide protection and support.  If your toe feels better having it taped to the next 1 you may do so otherwise wear the boot to prevent your toe from flexing when you are walking.  Ice and elevation to reduce swelling.  Ibuprofen or Tylenol  as needed for pain.

## 2023-08-30 NOTE — ED Provider Notes (Signed)
 [facility] Provider Note    Event Date/Time   First MD Initiated Contact with Patient 08/30/23 1216     (approximate)   History   Foot Injury and Ankle Injury   HPI  Bonnie Garcia is a 58 y.o. female presents to the ED with complaint of foot injury that occurred last evening.  Patient states that she and her husband were taking down a shed when the building collapsed falling on her ankle and foot.  She woke up this morning with increased swelling and pain.     Physical Exam   Triage Vital Signs: ED Triage Vitals  Encounter Vitals Group     BP 08/30/23 1203 (!) 141/84     Girls Systolic BP Percentile --      Girls Diastolic BP Percentile --      Boys Systolic BP Percentile --      Boys Diastolic BP Percentile --      Pulse Rate 08/30/23 1203 (!) 102     Resp 08/30/23 1203 20     Temp 08/30/23 1203 98.3 F (36.8 C)     Temp Source 08/30/23 1203 Oral     SpO2 08/30/23 1203 96 %     Weight 08/30/23 1206 260 lb (117.9 kg)     Height 08/30/23 1206 5' 10 (1.778 m)     Head Circumference --      Peak Flow --      Pain Score 08/30/23 1202 8     Pain Loc --      Pain Education --      Exclude from Growth Chart --     Most recent vital signs: Vitals:   08/30/23 1203  BP: (!) 141/84  Pulse: (!) 102  Resp: 20  Temp: 98.3 F (36.8 C)  SpO2: 96%     General: Awake, no distress.  CV:  Good peripheral perfusion.  Resp:  Normal effort.  Abd:  No distention.  Other:  Left foot and ankle exam shows soft tissue edema dorsum of the left patient was made aware there is moderate tenderness on palpation of second digit.  There is also a small blister type injury on the dorsal aspect of the fourth toe.  No active bleeding.  No point tenderness on palpation of the ankle.  Range of motion is slow and restricted secondary to increased pain.   ED Results / Procedures / Treatments   Labs (all labs ordered are listed, but only abnormal results are  displayed) Labs Reviewed - No data to display   RADIOLOGY Left ankle x-ray images reviewed and interpreted by myself independent of the radiologist and was negative for fracture or dislocation. X-ray images of the left foot show possible nondisplaced fracture of the second digit.  Official radiology report shows high probability of a fracture second proximal phalanx extending into the PIP joint.    PROCEDURES:  Critical Care performed:   Procedures   MEDICATIONS ORDERED IN ED: Medications  Tdap (BOOSTRIX) injection 0.5 mL (0.5 mLs Intramuscular Given 08/30/23 1338)     IMPRESSION / MDM / ASSESSMENT AND PLAN / ED COURSE  I reviewed the triage vital signs and the nursing notes.   Differential diagnosis includes, but is not limited to, contusion left foot and ankle, fracture, dislocation, laceration, crush injury.  58 year old female presents to the ED with an injury to his left foot and ankle after a tin building that she and her husband were taking down fell on  her left foot and ankle.  X-rays were made and patient was made aware that she has a fracture to her second digit.  She is already seeing Dr. Ashley who is a podiatrist over [facility].  She currently is taking Duricef for infection.  She was encouraged to continue taking that until completely finished.  She was placed in a cam walker for her fractured second foot and encouraged to ice and elevate to reduce swelling and help with pain.  Tylenol  or ibuprofen as needed for discomfort.  Follow-up with Dr. Ashley for the new injury as well as what he was previously seeing her for.       Patient's presentation is most consistent with acute complicated illness / injury requiring diagnostic workup.  FINAL CLINICAL IMPRESSION(S) / ED DIAGNOSES   Final diagnoses:  Fracture of second toe, left, closed, initial encounter  Contusion of left foot, initial encounter     Rx / DC Orders   ED Discharge Orders     None         Note:  This document was prepared using Dragon voice recognition software and may include unintentional dictation errors.   Saunders Shona CROME, PA-C 08/30/23 1409    Dorothyann Drivers, MD 08/30/23 [phone]

## 2023-08-30 NOTE — ED Triage Notes (Signed)
 Pt to ED for L foot and ankle injury from pile of tin falling onto her. Small abrasion to lateral ankle, unknown last Tdap. Swelling to lateral ankle and top of foot. Is currently on Duricef (abx) from podiatrist.

## 2023-09-14 ENCOUNTER — Ambulatory Visit: Admitting: Family Medicine

## 2023-09-14 ENCOUNTER — Other Ambulatory Visit: Payer: Self-pay | Admitting: Family Medicine

## 2023-09-14 DIAGNOSIS — G8929 Other chronic pain: Secondary | ICD-10-CM

## 2023-09-17 ENCOUNTER — Other Ambulatory Visit (HOSPITAL_COMMUNITY)
Admission: RE | Admit: 2023-09-17 | Discharge: 2023-09-17 | Disposition: A | Discharge status: Home / Self Care | Source: Ambulatory Visit | Attending: Family Medicine | Admitting: Family Medicine

## 2023-09-17 ENCOUNTER — Encounter: Payer: Self-pay | Admitting: Family Medicine

## 2023-09-17 ENCOUNTER — Ambulatory Visit (INDEPENDENT_AMBULATORY_CARE_PROVIDER_SITE_OTHER): Admitting: Family Medicine

## 2023-09-17 VITALS — BP 120/70 | HR 87 | Resp 16 | Ht 70.0 in | Wt 250.3 lb

## 2023-09-17 DIAGNOSIS — E1169 Type 2 diabetes mellitus with other specified complication: Secondary | ICD-10-CM | POA: Diagnosis not present

## 2023-09-17 DIAGNOSIS — G43009 Migraine without aura, not intractable, without status migrainosus: Secondary | ICD-10-CM | POA: Diagnosis not present

## 2023-09-17 DIAGNOSIS — Z1151 Encounter for screening for human papillomavirus (HPV): Secondary | ICD-10-CM | POA: Diagnosis not present

## 2023-09-17 DIAGNOSIS — Z794 Long term (current) use of insulin: Secondary | ICD-10-CM | POA: Diagnosis not present

## 2023-09-17 DIAGNOSIS — Z124 Encounter for screening for malignant neoplasm of cervix: Secondary | ICD-10-CM

## 2023-09-17 DIAGNOSIS — E1159 Type 2 diabetes mellitus with other circulatory complications: Secondary | ICD-10-CM

## 2023-09-17 DIAGNOSIS — Z01419 Encounter for gynecological examination (general) (routine) without abnormal findings: Secondary | ICD-10-CM | POA: Insufficient documentation

## 2023-09-17 DIAGNOSIS — F332 Major depressive disorder, recurrent severe without psychotic features: Secondary | ICD-10-CM

## 2023-09-17 DIAGNOSIS — I152 Hypertension secondary to endocrine disorders: Secondary | ICD-10-CM

## 2023-09-17 DIAGNOSIS — M545 Low back pain, unspecified: Secondary | ICD-10-CM

## 2023-09-17 DIAGNOSIS — E785 Hyperlipidemia, unspecified: Secondary | ICD-10-CM

## 2023-09-17 DIAGNOSIS — G8929 Other chronic pain: Secondary | ICD-10-CM

## 2023-09-17 LAB — POCT GLYCOSYLATED HEMOGLOBIN (HGB A1C): Hemoglobin A1C: 6.9 % — AB (ref 4.0–5.6)

## 2023-09-17 MED ORDER — MELOXICAM 15 MG PO TABS: ORAL_TABLET | ORAL | 1 refills | Status: DC

## 2023-09-17 MED ORDER — TIRZEPATIDE 12.5 MG/0.5ML ~~LOC~~ SOAJ: 12.5000 mg | SUBCUTANEOUS | 1 refills | Status: DC

## 2023-09-17 MED ORDER — ROSUVASTATIN CALCIUM 5 MG PO TABS: 5.0000 mg | ORAL_TABLET | Freq: Every day | ORAL | 1 refills | Status: AC

## 2023-09-17 MED ORDER — SUMATRIPTAN SUCCINATE 50 MG PO TABS: 50.0000 mg | ORAL_TABLET | ORAL | 5 refills | Status: AC | PRN

## 2023-09-17 NOTE — Assessment & Plan Note (Signed)
 T2DM is well controlled on current medication regiment. Recent A1C resulted 6.9. - Continue Mounjaro  12.5 mg weekly - Repeat A1C and CMP next visit

## 2023-09-17 NOTE — Assessment & Plan Note (Signed)
 Controlled and managed on current medication regimen with improvement since recent passing of her mother. - Continue Celexa  40 mg daily.

## 2023-09-17 NOTE — Assessment & Plan Note (Signed)
 Currently well controlled on current medication regimen. - Continue Crestor  5 mg daily - Repeat lipid panel in 04/26

## 2023-09-17 NOTE — Assessment & Plan Note (Signed)
 HTN is well controlled with current regimen and diet and exercise - Continue current management - Recheck CMP at next visit

## 2023-09-17 NOTE — Progress Notes (Signed)
 Established Patient Office Visit  Subjective   Patient ID: Bonnie Garcia, female    DOB: December 26, 1965  Age: 58 y.o. MRN: [id number]  Chief Complaint  Patient presents with   Medical Management of Chronic Issues    T2DM follow-up and a pap smear   Migraine    For a couple of months    Bonnie Garcia is a 58 yo female with a history of HTN, T2DM, HLD, and depression who presents to the clinic today for follow-up management of chronic conditions.   On interview, she states that her hypertension has been well controlled with her pressures running in the 120s/70s at home compared to the office reading of 146/88. She reports taking her medications and denies any side effects. In terms of her T2DM, she reports better control on her increased Mounjaro  dose. She states her blood sugars run in the 110-120s in the evenings and in the 170s after a meal. She reports needing Zofran  a few days after an injection and has been recently having her appetite return with cravings for fruits like pineapple and watermelon. She reports control of hyperlipidemia, taking her medication daily without any side effects. She also reports doing better in regards to her depression after the passing of her mother in December. She states that her antidepressant medication has helped a lot.  Outside of her chronic conditions, she states that she has an acute concerns regarding migraines. She describes a recent onset of headaches with photophobia, phonophobia, and nausea 3x a week with improvement when laying down in a quiet, dark room. She denies any previous history of migraines and states that she has tried over the counter treatments such as aleve and tylenol  without success.  She is also interested in addressing general health maintenance goals today.  Migraine  Pertinent negatives include no dizziness.      Review of Systems  Neurological:  Negative for dizziness and loss of consciousness.      Objective:      BP 120/70 Comment: home reading  Pulse 87   Resp 16   Ht 5' 10 (1.778 m)   Wt 250 lb 4.8 oz (113.5 kg)   LMP  (Approximate)   BMI 35.91 kg/m    Physical Exam Vitals reviewed.  Constitutional:      General: She is not in acute distress.    Appearance: Normal appearance. She is not ill-appearing.  HENT:     Head: Normocephalic.     Right Ear: Tympanic membrane, ear canal and external ear normal.     Left Ear: Tympanic membrane, ear canal and external ear normal.     Nose: Nose normal.     Mouth/Throat:     Mouth: Mucous membranes are moist.     Pharynx: Oropharynx is clear.  Eyes:     General: No scleral icterus.    Conjunctiva/sclera: Conjunctivae normal.     Pupils: Pupils are equal, round, and reactive to light.  Cardiovascular:     Rate and Rhythm: Normal rate and regular rhythm.     Pulses: Normal pulses.  Pulmonary:     Effort: Pulmonary effort is normal. No respiratory distress.     Breath sounds: Normal breath sounds. No wheezing.  Abdominal:     General: There is no distension.     Palpations: Abdomen is soft.     Tenderness: There is no abdominal tenderness.  Genitourinary:    Comments: GYN:  External genitalia within normal limits.  Vaginal mucosa pink, moist,  normal rugae.  Nonfriable cervix without lesions, no discharge or bleeding noted on speculum exam.   Musculoskeletal:     Cervical back: Normal range of motion.     Right lower leg: No edema.     Left lower leg: No edema.  Lymphadenopathy:     Cervical: No cervical adenopathy.  Skin:    General: Skin is warm and dry.     Capillary Refill: Capillary refill takes less than 2 seconds.  Neurological:     Mental Status: She is alert and oriented to person, place, and time.      Results for orders placed or performed in visit on 09/17/23  POCT glycosylated hemoglobin (Hb A1C)  Result Value Ref Range   Hemoglobin A1C 6.9 (A) 4.0 - 5.6 %      The 10-year ASCVD risk score (Arnett DK, et al.,  2019) is: 2.8%    Assessment & Plan:   Problem List Items Addressed This Visit       Cardiovascular and Mediastinum   Hypertension associated with diabetes (HCC)   HTN is well controlled with current regimen and diet and exercise - Continue current management - Recheck CMP at next visit      Relevant Medications   tirzepatide  (MOUNJARO ) 12.5 MG/0.5ML Pen   rosuvastatin  (CRESTOR ) 5 MG tablet     Endocrine   Diabetes mellitus (HCC) - Primary   T2DM is well controlled on current medication regiment. Recent A1C resulted 6.9. - Continue Mounjaro  12.5 mg weekly - Repeat A1C and CMP next visit      Relevant Medications   tirzepatide  (MOUNJARO ) 12.5 MG/0.5ML Pen   rosuvastatin  (CRESTOR ) 5 MG tablet   Other Relevant Orders   POCT glycosylated hemoglobin (Hb A1C) (Completed)   Hyperlipidemia associated with type 2 diabetes mellitus (HCC)   Currently well controlled on current medication regimen. - Continue Crestor  5 mg daily - Repeat lipid panel in 04/26      Relevant Medications   tirzepatide  (MOUNJARO ) 12.5 MG/0.5ML Pen   rosuvastatin  (CRESTOR ) 5 MG tablet     Other   Severe recurrent major depression without psychotic features (HCC)   Controlled and managed on current medication regimen with improvement since recent passing of her mother. - Continue Celexa  40 mg daily.      Chronic bilateral low back pain without sciatica   Relevant Medications   meloxicam  (MOBIC ) 15 MG tablet   Other Visit Diagnoses       Cervical cancer screening       Relevant Orders   Cytology - PAP     Migraine without aura and without status migrainosus, not intractable       Relevant Medications   SUMAtriptan  (IMITREX ) 50 MG tablet   rosuvastatin  (CRESTOR ) 5 MG tablet   meloxicam  (MOBIC ) 15 MG tablet      Migraine without aura Patient presents with a constellation of symptoms consistent with migraines without aura - Start sumatriptan  50 mg as needed for migraine treatment - Counseled  patient on when to use sumatriptan  as abortive medication  General Health Maintenance  Patient is due for Hep B vaccination - Patient reports history of Hep B vaccination in the early 2000s. - Recheck HBs antibody titers at follow up appointment  Patient is due for mammogram - Ambulatory referral for mammogram  Patient is due for pap smear with HPV cotesting - Collected pap smear sample for analysis  Return in about 6 months (around 03/19/2024) for chronic disease f/u.    Nandan  LULLA Blanch, Medical Student  Patient seen along with MS3 student, Elia Blanch. I personally evaluated this patient along with the student, and verified all aspects of the history, physical exam, and medical decision making as documented by the student. I agree with the student's documentation and have made all necessary edits.  Natara Monfort, Jon HERO, MD, MPH [facility]

## 2023-09-24 ENCOUNTER — Ambulatory Visit: Payer: Self-pay | Admitting: Family Medicine

## 2023-09-24 LAB — CYTOLOGY - PAP
Comment: NEGATIVE
Diagnosis: NEGATIVE
High risk HPV: NEGATIVE

## 2023-11-11 ENCOUNTER — Ambulatory Visit
Admission: RE | Admit: 2023-11-11 | Discharge: 2023-11-11 | Disposition: A | Discharge status: Home / Self Care | Source: Ambulatory Visit | Attending: Family Medicine | Admitting: Family Medicine

## 2023-11-11 DIAGNOSIS — Z1231 Encounter for screening mammogram for malignant neoplasm of breast: Secondary | ICD-10-CM | POA: Diagnosis not present

## 2023-11-12 DIAGNOSIS — D229 Melanocytic nevi, unspecified: Secondary | ICD-10-CM | POA: Diagnosis not present

## 2023-11-12 DIAGNOSIS — K130 Diseases of lips: Secondary | ICD-10-CM | POA: Diagnosis not present

## 2023-11-12 DIAGNOSIS — B078 Other viral warts: Secondary | ICD-10-CM | POA: Diagnosis not present

## 2023-11-16 ENCOUNTER — Other Ambulatory Visit: Payer: Self-pay | Admitting: Family Medicine

## 2023-11-16 DIAGNOSIS — R928 Other abnormal and inconclusive findings on diagnostic imaging of breast: Secondary | ICD-10-CM

## 2023-11-18 ENCOUNTER — Ambulatory Visit
Admission: RE | Admit: 2023-11-18 | Discharge: 2023-11-18 | Disposition: A | Discharge status: Home / Self Care | Source: Ambulatory Visit | Attending: Family Medicine | Admitting: Family Medicine

## 2023-11-18 DIAGNOSIS — R928 Other abnormal and inconclusive findings on diagnostic imaging of breast: Secondary | ICD-10-CM | POA: Insufficient documentation

## 2023-11-18 DIAGNOSIS — N6315 Unspecified lump in the right breast, overlapping quadrants: Secondary | ICD-10-CM | POA: Diagnosis not present

## 2023-11-18 DIAGNOSIS — R92321 Mammographic fibroglandular density, right breast: Secondary | ICD-10-CM | POA: Diagnosis not present

## 2023-11-24 ENCOUNTER — Ambulatory Visit
Admission: RE | Admit: 2023-11-24 | Discharge: 2023-11-24 | Disposition: A | Discharge status: Home / Self Care | Source: Ambulatory Visit | Attending: Family Medicine

## 2023-11-24 ENCOUNTER — Ambulatory Visit
Admission: RE | Admit: 2023-11-24 | Discharge: 2023-11-24 | Disposition: A | Discharge status: Home / Self Care | Source: Ambulatory Visit | Attending: Family Medicine | Admitting: Family Medicine

## 2023-11-24 DIAGNOSIS — R928 Other abnormal and inconclusive findings on diagnostic imaging of breast: Secondary | ICD-10-CM

## 2023-11-24 DIAGNOSIS — N6081 Other benign mammary dysplasias of right breast: Secondary | ICD-10-CM | POA: Diagnosis not present

## 2023-11-24 DIAGNOSIS — N6341 Unspecified lump in right breast, subareolar: Secondary | ICD-10-CM | POA: Diagnosis not present

## 2023-11-24 HISTORY — PX: BREAST BIOPSY: SHX20

## 2023-11-24 MED ORDER — LIDOCAINE 1 % OPTIME INJ - NO CHARGE
5.0000 mL | Freq: Once | INTRAMUSCULAR | Status: AC
  Administered 2023-11-24: 5 mL
  Filled 2023-11-24: qty 6

## 2023-11-24 MED ORDER — LIDOCAINE-EPINEPHRINE 1 %-1:100000 IJ SOLN
20.0000 mL | Freq: Once | INTRAMUSCULAR | Status: AC
  Administered 2023-11-24: 20 mL
  Filled 2023-11-24: qty 20

## 2023-11-25 LAB — SURGICAL PATHOLOGY

## 2023-11-26 DIAGNOSIS — S91204A Unspecified open wound of right lesser toe(s) with damage to nail, initial encounter: Secondary | ICD-10-CM | POA: Diagnosis not present

## 2023-12-08 ENCOUNTER — Other Ambulatory Visit (HOSPITAL_COMMUNITY): Payer: Self-pay

## 2023-12-10 ENCOUNTER — Other Ambulatory Visit (HOSPITAL_COMMUNITY): Payer: Self-pay

## 2023-12-10 ENCOUNTER — Telehealth: Payer: Self-pay

## 2023-12-10 NOTE — Telephone Encounter (Signed)
 Pharmacy Patient Advocate Encounter   Received notification from Onbase that prior authorization for Mounjaro  12.5mg /0.6ml is required/requested.   Insurance verification completed.   The patient is insured through [employer].   Per test claim: Refill too soon. PA is not needed at this time. Medication was filled 12/07/2023. Next eligible fill date is 12/31/2023.

## 2023-12-11 DIAGNOSIS — F5104 Psychophysiologic insomnia: Secondary | ICD-10-CM | POA: Diagnosis not present

## 2023-12-11 DIAGNOSIS — F331 Major depressive disorder, recurrent, moderate: Secondary | ICD-10-CM | POA: Diagnosis not present

## 2023-12-11 DIAGNOSIS — F410 Panic disorder [episodic paroxysmal anxiety] without agoraphobia: Secondary | ICD-10-CM | POA: Diagnosis not present

## 2023-12-11 DIAGNOSIS — F900 Attention-deficit hyperactivity disorder, predominantly inattentive type: Secondary | ICD-10-CM | POA: Diagnosis not present

## 2023-12-22 DIAGNOSIS — J3489 Other specified disorders of nose and nasal sinuses: Secondary | ICD-10-CM | POA: Diagnosis not present

## 2023-12-25 DIAGNOSIS — E11621 Type 2 diabetes mellitus with foot ulcer: Secondary | ICD-10-CM | POA: Diagnosis not present

## 2023-12-25 DIAGNOSIS — L97509 Non-pressure chronic ulcer of other part of unspecified foot with unspecified severity: Secondary | ICD-10-CM | POA: Diagnosis not present

## 2023-12-25 DIAGNOSIS — L089 Local infection of the skin and subcutaneous tissue, unspecified: Secondary | ICD-10-CM | POA: Diagnosis not present

## 2023-12-25 DIAGNOSIS — S90422A Blister (nonthermal), left great toe, initial encounter: Secondary | ICD-10-CM | POA: Diagnosis not present

## 2024-01-22 ENCOUNTER — Other Ambulatory Visit: Payer: Self-pay | Admitting: Student

## 2024-01-22 DIAGNOSIS — R0981 Nasal congestion: Secondary | ICD-10-CM

## 2024-01-22 DIAGNOSIS — J3489 Other specified disorders of nose and nasal sinuses: Secondary | ICD-10-CM | POA: Diagnosis not present

## 2024-02-02 ENCOUNTER — Ambulatory Visit

## 2024-02-02 ENCOUNTER — Encounter: Payer: Self-pay | Admitting: Student

## 2024-02-03 ENCOUNTER — Other Ambulatory Visit: Payer: Self-pay | Admitting: Podiatry

## 2024-02-03 ENCOUNTER — Inpatient Hospital Stay: Admission: RE | Admit: 2024-02-03 | Discharge: 2024-02-03 | Attending: Student

## 2024-02-03 DIAGNOSIS — J3489 Other specified disorders of nose and nasal sinuses: Secondary | ICD-10-CM

## 2024-02-03 DIAGNOSIS — M7672 Peroneal tendinitis, left leg: Secondary | ICD-10-CM

## 2024-02-04 ENCOUNTER — Encounter: Payer: Self-pay | Admitting: Podiatry

## 2024-02-06 ENCOUNTER — Other Ambulatory Visit

## 2024-02-08 DIAGNOSIS — M65351 Trigger finger, right little finger: Secondary | ICD-10-CM | POA: Diagnosis not present

## 2024-02-08 DIAGNOSIS — G5603 Carpal tunnel syndrome, bilateral upper limbs: Secondary | ICD-10-CM | POA: Diagnosis not present

## 2024-02-09 ENCOUNTER — Inpatient Hospital Stay: Admission: RE | Admit: 2024-02-09 | Discharge: 2024-02-09 | Attending: Podiatry | Admitting: Podiatry

## 2024-02-09 ENCOUNTER — Ambulatory Visit

## 2024-02-09 DIAGNOSIS — M7672 Peroneal tendinitis, left leg: Secondary | ICD-10-CM

## 2024-02-09 DIAGNOSIS — M25572 Pain in left ankle and joints of left foot: Secondary | ICD-10-CM | POA: Diagnosis not present

## 2024-02-11 ENCOUNTER — Ambulatory Visit

## 2024-02-16 ENCOUNTER — Ambulatory Visit

## 2024-02-18 ENCOUNTER — Other Ambulatory Visit (HOSPITAL_COMMUNITY): Payer: Self-pay

## 2024-02-18 ENCOUNTER — Telehealth: Payer: Self-pay | Admitting: Pharmacy Technician

## 2024-02-18 NOTE — Telephone Encounter (Signed)
 Pharmacy Patient Advocate Encounter   Received notification from Onbase that prior authorization for Mounjaro  12.5MG /0.5ML auto-injectors is due for renewal.   Insurance verification completed.   The patient is insured through [employer].  Action: PA required; PA submitted to above mentioned insurance via Latent Key/confirmation #/EOC [id number] Status is pending

## 2024-02-18 NOTE — Telephone Encounter (Signed)
 Pharmacy Patient Advocate Encounter  Received notification from [employer] that Prior Authorization for Mounjaro  12.5MG /0.5ML auto-injectors has been closed.     PA #/Case ID/Reference #: [id number]

## 2024-02-23 ENCOUNTER — Ambulatory Visit

## 2024-03-01 ENCOUNTER — Ambulatory Visit

## 2024-03-01 ENCOUNTER — Ambulatory Visit: Admitting: Family Medicine

## 2024-03-01 VITALS — BP 160/85 | HR 80 | Resp 16 | Ht 70.0 in | Wt 255.1 lb

## 2024-03-01 DIAGNOSIS — E538 Deficiency of other specified B group vitamins: Secondary | ICD-10-CM

## 2024-03-01 DIAGNOSIS — R350 Frequency of micturition: Secondary | ICD-10-CM

## 2024-03-01 DIAGNOSIS — I152 Hypertension secondary to endocrine disorders: Secondary | ICD-10-CM | POA: Diagnosis not present

## 2024-03-01 DIAGNOSIS — E1169 Type 2 diabetes mellitus with other specified complication: Secondary | ICD-10-CM

## 2024-03-01 DIAGNOSIS — Z794 Long term (current) use of insulin: Secondary | ICD-10-CM | POA: Diagnosis not present

## 2024-03-01 DIAGNOSIS — E1159 Type 2 diabetes mellitus with other circulatory complications: Secondary | ICD-10-CM | POA: Diagnosis not present

## 2024-03-01 DIAGNOSIS — E785 Hyperlipidemia, unspecified: Secondary | ICD-10-CM | POA: Diagnosis not present

## 2024-03-01 DIAGNOSIS — E041 Nontoxic single thyroid nodule: Secondary | ICD-10-CM | POA: Diagnosis not present

## 2024-03-01 LAB — POCT URINALYSIS DIPSTICK
Bilirubin, UA: NEGATIVE
Blood, UA: NEGATIVE
Glucose, UA: POSITIVE — AB
Ketones, UA: NEGATIVE
Leukocytes, UA: NEGATIVE
Nitrite, UA: NEGATIVE
Protein, UA: NEGATIVE
Spec Grav, UA: <=1.005 — AB
Urobilinogen, UA: 0.2 U/dL
pH, UA: 6.5

## 2024-03-01 LAB — POCT GLYCOSYLATED HEMOGLOBIN (HGB A1C): Hemoglobin A1C: 6.6 % — AB (ref 4.0–5.6)

## 2024-03-01 MED ORDER — ONDANSETRON 4 MG PO TBDP: 4.0000 mg | ORAL_TABLET | Freq: Three times a day (TID) | ORAL | 2 refills | Status: AC | PRN

## 2024-03-01 MED ORDER — TIRZEPATIDE 15 MG/0.5ML ~~LOC~~ SOAJ: 15.0000 mg | SUBCUTANEOUS | 3 refills | Status: AC

## 2024-03-01 MED ORDER — GLUCOSE BLOOD VI STRP: ORAL_STRIP | 12 refills | Status: AC

## 2024-03-01 MED ORDER — FLUCONAZOLE 150 MG PO TABS: 150.0000 mg | ORAL_TABLET | Freq: Once | ORAL | 0 refills | Status: AC

## 2024-03-01 MED ORDER — SUCRALFATE 1 G PO TABS: 1.0000 g | ORAL_TABLET | Freq: Three times a day (TID) | ORAL | 3 refills | Status: AC

## 2024-03-01 NOTE — Assessment & Plan Note (Signed)
 Management with Mounjaro . Reports increased hunger and recent weight gain of 5 pounds. Discussed potential switch to Wegovy , but decided to increase Mounjaro  dose instead. - Increased Mounjaro  to 15 mg weekly.

## 2024-03-01 NOTE — Progress Notes (Signed)
 "     Established patient visit   Patient: Bonnie Garcia   DOB: 1966-02-08   58 y.o. Female  MRN: [id number] Visit Date: 03/01/2024  Today's healthcare provider: Jon Eva, MD   Chief Complaint  Patient presents with   Medical Management of Chronic Issues    5 month f/u   Medication Refill    Unsure which ones she needs. Pended the few she knew of   Form Completion    Needs podiatry clearance form filled   Subjective    HPI HPI     Medical Management of Chronic Issues    Additional comments: 5 month f/u        Medication Refill    Additional comments: Unsure which ones she needs. Pended the few she knew of        Form Completion    Additional comments: Needs podiatry clearance form filled      Last edited by Wilfred Hargis RAMAN, CMA on 03/01/2024  1:46 PM.       Discussed the use of AI scribe software for clinical note transcription with the patient, who gave verbal consent to proceed.  History of Present Illness   Bonnie Garcia is a 58 year old female with diabetes and carpal tunnel syndrome who presents for clearance for podiatry and follow-up on her medical conditions.  A nerve test showed severe carpal tunnel in one hand and mild in the other, with nocturnal symptoms described as electric shocks.  She takes Sigduo one tablet twice daily, Mounjaro  12.5 mg weekly, Crestor  5 mg daily, aspirin , Carafate  for stomach symptoms, and Zofran  for nausea after Mounjaro . She asks about increasing her Mounjaro  dose due to persistent hunger and a recent five-pound weight gain and notes prior use of Ozempic .  She reports increased urination, a sensation of incomplete bladder emptying, and occasional burning, which she thinks may be a yeast infection after recent antibiotics for a toe issue.  Her A1c is being monitored and she is worried about the result. 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 She reports white coat hypertension affecting her  blood pressure readings.       Medications: Show/hide medication list[1]  Review of Systems     Objective    BP (!) 160/85   Pulse 80   Resp 16   Ht 5' 10 (1.778 m)   Wt 255 lb 1.6 oz (115.7 kg)   SpO2 98%   BMI 36.60 kg/m    Physical Exam Vitals reviewed.  Constitutional:      General: She is not in acute distress.    Appearance: Normal appearance. She is well-developed. She is not diaphoretic.  HENT:     Head: Normocephalic and atraumatic.  Eyes:     General: No scleral icterus.    Conjunctiva/sclera: Conjunctivae normal.  Neck:     Thyroid : No thyromegaly.  Cardiovascular:     Rate and Rhythm: Normal rate and regular rhythm.     Heart sounds: Normal heart sounds. No murmur heard. Pulmonary:     Effort: Pulmonary effort is normal. No respiratory distress.     Breath sounds: Normal breath sounds. No wheezing, rhonchi or rales.  Musculoskeletal:     Cervical back: Neck supple.     Right lower leg: No edema.     Left lower leg: No edema.  Lymphadenopathy:     Cervical: No cervical adenopathy.  Skin:    General: Skin is warm and dry.  Findings: No rash.  Neurological:     Mental Status: She is alert and oriented to person, place, and time. Mental status is at baseline.  Psychiatric:        Mood and Affect: Mood normal.        Behavior: Behavior normal.      Results for orders placed or performed in visit on 03/01/24  POCT HgB A1C  Result Value Ref Range   Hemoglobin A1C 6.6 (A) 4.0 - 5.6 %   HbA1c POC (<> result, manual entry)     HbA1c, POC (prediabetic range)     HbA1c, POC (controlled diabetic range)    POCT Urinalysis Dipstick  Result Value Ref Range   Color, UA yellow    Clarity, UA clear    Glucose, UA Positive (A) Negative   Bilirubin, UA negative    Ketones, UA negative    Spec Grav, UA <=1.005 (A) 1.010 - 1.025   Blood, UA negative    pH, UA 6.5 5.0 - 8.0   Protein, UA Negative Negative   Urobilinogen, UA 0.2 0.2 or 1.0 E.U./dL    Nitrite, UA negative    Leukocytes, UA Negative Negative   Appearance     Odor      Assessment & Plan     Problem List Items Addressed This Visit       Cardiovascular and Mediastinum   Hypertension associated with diabetes (HCC)   Uncontrolled Has white coat syndrome Will monitor home readings No med changes currently      Relevant Medications   tirzepatide  (MOUNJARO ) 15 MG/0.5ML Pen   Other Relevant Orders   Comprehensive metabolic panel with GFR     Endocrine   Diabetes mellitus (HCC) - Primary   Type 2 diabetes mellitus with complications, currently managed with Mounjaro . A1c is 6.6, indicating good control. Increased urination likely due to hyperglycemia rather than infection. - Increased Mounjaro  to 15 mg weekly. - Instructed to hold Mounjaro  one week prior to surgery due to risk of aspiration. - Rechecked A1c today. - Monitor blood glucose levels.      Relevant Medications   tirzepatide  (MOUNJARO ) 15 MG/0.5ML Pen   Other Relevant Orders   POCT HgB A1C (Completed)   Thyroid  nodule   Nontoxic single thyroid  nodule, no acute issues discussed. - Rechecked thyroid  function tests today.      Relevant Orders   TSH   Hyperlipidemia associated with type 2 diabetes mellitus (HCC)   Previously well controlled Continue statin Repeat FLP and CMP Goal LDL < 70       Relevant Medications   tirzepatide  (MOUNJARO ) 15 MG/0.5ML Pen   Other Relevant Orders   Comprehensive metabolic panel with GFR   Lipid panel     Other   B12 deficiency   Vitamin B12 deficiency, previously noted low levels. - Rechecked B12 levels today. - consider shots if remains low - has taken them previously      Relevant Orders   B12   Morbid obesity (HCC)   Management with Mounjaro . Reports increased hunger and recent weight gain of 5 pounds. Discussed potential switch to Wegovy , but decided to increase Mounjaro  dose instead. - Increased Mounjaro  to 15 mg weekly.      Relevant  Medications   tirzepatide  (MOUNJARO ) 15 MG/0.5ML Pen   Other Visit Diagnoses       Urinary frequency       Relevant Orders   POCT Urinalysis Dipstick (Completed)  Urinary frequency and urge incontinence Urinary frequency and urge incontinence, possibly related to hyperglycemia. No signs of infection on urinalysis. Caffeine intake may contribute to symptoms. - Rechecked urinalysis to rule out infection. - Advised on reducing caffeine intake.  Candidal vulvovaginitis Candidal vulvovaginitis, likely secondary to antibiotic use for toe infection. - Prescribed Diflucan  for yeast infection.        Return in about 3 months (around 05/30/2024) for chronic disease f/u.       Jon Eva, MD  [facility] [phone] (phone) [phone] (fax)  [facility]      [1]  Outpatient Medications Prior to Visit  Medication Sig   albuterol  (VENTOLIN  HFA) 108 (90 Base) MCG/ACT inhaler INHALE 1 TO 2 PUFFS INTO THE LUNGS EVERY 6 HOURS AS NEEDED FOR WHEEZING OR SHORTNESS OF BREATH   amphetamine-dextroamphetamine (ADDERALL) 20 MG tablet Take 20 mg by mouth 3 (three) times daily.   aspirin  EC 81 MG tablet Take 81 mg by mouth daily.   B Complex Vitamins (B COMPLEX PO) Take by mouth.   Blood Glucose Monitoring Suppl (ACCU-CHEK AVIVA PLUS) w/Device KIT 1 Device by Does not apply route 3 (three) times daily.   Cholecalciferol 25 MCG (1000 UT) capsule Take 1,000 Units by mouth daily.   citalopram  (CELEXA ) 40 MG tablet Take 1 tablet (40 mg total) by mouth daily.   clonazePAM (KLONOPIN) 1 MG tablet Take 1 mg by mouth at bedtime as needed. Take as needed   Dapagliflozin  Pro-metFORMIN  ER (XIGDUO  XR) 07-998 MG TB24 Take 1 tablet by mouth 2 (two) times daily.   EVENING PRIMROSE OIL PO Take by mouth.   gabapentin  (NEURONTIN ) 300 MG capsule Take 2 capsules (600 mg total) by mouth 2 (two) times daily.   glucose blood test strip Use as instructed to  test blood sugar 2-3 times daily   LORazepam  (ATIVAN ) 1 MG tablet TK 1 T PO BID UTD FOR PANIC SYMPTOMS   meloxicam  (MOBIC ) 15 MG tablet TAKE 1 TABLET(15 MG) BY MOUTH DAILY   rosuvastatin  (CRESTOR ) 5 MG tablet Take 1 tablet (5 mg total) by mouth daily.   SUMAtriptan  (IMITREX ) 50 MG tablet Take 1 tablet (50 mg total) by mouth every 2 (two) hours as needed for migraine. May repeat in 2 hours if headache persists or recurs.   traZODone  (DESYREL ) 150 MG tablet Take 150 mg by mouth at bedtime.   [DISCONTINUED] glucose blood test strip Use to check blood sugar 2-3 times daily.   [DISCONTINUED] ondansetron  (ZOFRAN -ODT) 4 MG disintegrating tablet Take 1 tablet (4 mg total) by mouth every 8 (eight) hours as needed for nausea or vomiting.   [DISCONTINUED] sucralfate  (CARAFATE ) 1 g tablet Take 1 tablet (1 g total) by mouth 4 (four) times daily -  with meals and at bedtime. (Patient taking differently: Take 1 g by mouth 4 (four) times daily -  with meals and at bedtime. Takes as needed)   [DISCONTINUED] tirzepatide  (MOUNJARO ) 12.5 MG/0.5ML Pen Inject 12.5 mg into the skin once a week.   No facility-administered medications prior to visit.   "

## 2024-03-01 NOTE — Assessment & Plan Note (Signed)
 Previously well controlled Continue statin Repeat FLP and CMP Goal LDL < 70

## 2024-03-01 NOTE — Assessment & Plan Note (Signed)
 Type 2 diabetes mellitus with complications, currently managed with Mounjaro . A1c is 6.6, indicating good control. Increased urination likely due to hyperglycemia rather than infection. - Increased Mounjaro  to 15 mg weekly. - Instructed to hold Mounjaro  one week prior to surgery due to risk of aspiration. - Rechecked A1c today. - Monitor blood glucose levels.

## 2024-03-01 NOTE — Assessment & Plan Note (Signed)
 Uncontrolled Has white coat syndrome Will monitor home readings No med changes currently

## 2024-03-01 NOTE — Assessment & Plan Note (Signed)
 Nontoxic single thyroid  nodule, no acute issues discussed. - Rechecked thyroid  function tests today.

## 2024-03-01 NOTE — Assessment & Plan Note (Signed)
 Vitamin B12 deficiency, previously noted low levels. - Rechecked B12 levels today. - consider shots if remains low - has taken them previously

## 2024-03-02 LAB — COMPREHENSIVE METABOLIC PANEL WITH GFR
ALT: 26 IU/L (ref 0–32)
AST: 28 IU/L (ref 0–40)
Albumin: 4.6 g/dL (ref 3.8–4.9)
Alkaline Phosphatase: 64 IU/L (ref 49–135)
BUN/Creatinine Ratio: 13 (ref 9–23)
BUN: 9 mg/dL (ref 6–24)
Bilirubin Total: 0.3 mg/dL (ref 0.0–1.2)
CO2: 25 mmol/L (ref 20–29)
Calcium: 9.7 mg/dL (ref 8.7–10.2)
Chloride: 97 mmol/L (ref 96–106)
Creatinine, Ser: 0.71 mg/dL (ref 0.57–1.00)
Globulin, Total: 2.6 g/dL (ref 1.5–4.5)
Glucose: 105 mg/dL — ABNORMAL HIGH (ref 70–99)
Potassium: 4.7 mmol/L (ref 3.5–5.2)
Sodium: 135 mmol/L (ref 134–144)
Total Protein: 7.2 g/dL (ref 6.0–8.5)
eGFR: 99 mL/min/1.73

## 2024-03-02 LAB — LIPID PANEL
Chol/HDL Ratio: 2.7 ratio (ref 0.0–4.4)
Cholesterol, Total: 165 mg/dL (ref 100–199)
HDL: 61 mg/dL
LDL Chol Calc (NIH): 75 mg/dL (ref 0–99)
Triglycerides: 171 mg/dL — ABNORMAL HIGH (ref 0–149)
VLDL Cholesterol Cal: 29 mg/dL (ref 5–40)

## 2024-03-02 LAB — VITAMIN B12: Vitamin B-12: 336 pg/mL (ref 232–1245)

## 2024-03-02 LAB — TSH: TSH: 0.938 u[IU]/mL (ref 0.450–4.500)

## 2024-03-08 ENCOUNTER — Ambulatory Visit: Payer: Self-pay | Admitting: Family Medicine

## 2024-03-14 ENCOUNTER — Ambulatory Visit

## 2024-03-16 ENCOUNTER — Ambulatory Visit

## 2024-03-21 ENCOUNTER — Ambulatory Visit

## 2024-03-23 ENCOUNTER — Ambulatory Visit: Attending: Orthopedic Surgery

## 2024-03-23 DIAGNOSIS — M25631 Stiffness of right wrist, not elsewhere classified: Secondary | ICD-10-CM | POA: Diagnosis present

## 2024-03-23 DIAGNOSIS — R29898 Other symptoms and signs involving the musculoskeletal system: Secondary | ICD-10-CM | POA: Insufficient documentation

## 2024-03-23 DIAGNOSIS — M25531 Pain in right wrist: Secondary | ICD-10-CM | POA: Insufficient documentation

## 2024-03-23 NOTE — Therapy (Signed)
 " OUTPATIENT PHYSICAL THERAPY UPPER EXTREMITY EVALUATION   Patient Name: Bonnie Garcia MRN: [id number] DOB:[birth year], 59 y.o., female Today's Date: 03/23/2024  END OF SESSION:  PT End of Session - 03/23/24 1619     Visit Number 1    Number of Visits 17    Date for Recertification  05/13/24    PT Start Time 1409    PT Stop Time 1459    PT Time Calculation (min) 50 min    Activity Tolerance Patient tolerated treatment well    Behavior During Therapy [facility] for tasks assessed/performed          Past Medical History:  Diagnosis Date   ADHD (attention deficit hyperactivity disorder)    Anxiety    Arthritis 2020   Depression    Diabetes mellitus without complication (HCC)    Hypertension    Kidney stones    Thyroid  nodule    Past Surgical History:  Procedure Laterality Date   APPENDECTOMY     BREAST BIOPSY Right 11/24/2023   MM RT BREAST BX W LOC DEV 1ST LESION IMAGE BX SPEC STEREO GUIDE 11/24/2023 [facility]   CHOLECYSTECTOMY     COLONOSCOPY WITH PROPOFOL  N/A 07/23/2015   Procedure: COLONOSCOPY WITH PROPOFOL ;  Surgeon: Gladis RAYMOND Mariner, MD;  Location: [facility] ENDOSCOPY;  Service: Endoscopy;  Laterality: N/A;   ECTOPIC PREGNANCY SURGERY     ENDOMETRIAL ABLATION     ESOPHAGOGASTRODUODENOSCOPY (EGD) WITH PROPOFOL  N/A 07/23/2015   Procedure: ESOPHAGOGASTRODUODENOSCOPY (EGD) WITH PROPOFOL ;  Surgeon: Gladis RAYMOND Mariner, MD;  Location: [facility] ENDOSCOPY;  Service: Endoscopy;  Laterality: N/A;   TUBAL LIGATION     Patient Active Problem List   Diagnosis Date Noted   Other chest pain 12/02/2022   Elevated hemoglobin 05/08/2022   Breast cancer screening by mammogram 02/06/2022   Vaginal yeast infection 01/15/2022   Gastroesophageal reflux disease with esophagitis without hemorrhage 01/15/2022   Blood blister 11/07/2021   Vasomotor symptoms due to menopause 11/07/2021   Acute pain of right shoulder 06/17/2021   Adhesive capsulitis of right shoulder 06/17/2021   Avitaminosis D  01/03/2021   Left hip pain 01/03/2021   Chronic bilateral low back pain without sciatica 06/23/2019   Myalgia 12/17/2017   Thyroid  nodule 10/21/2017   Hyperlipidemia associated with type 2 diabetes mellitus (HCC) 10/21/2017   PCOS (polycystic ovarian syndrome) 10/21/2017   Hirsutism 10/21/2017   Dysuria 10/21/2017   Severe recurrent major depression without psychotic features (HCC) 09/11/2017   ADHD (attention deficit hyperactivity disorder) 09/11/2017   Diabetes mellitus (HCC) 09/11/2017   Hypertension associated with diabetes (HCC) 09/11/2017   B12 deficiency 10/13/2013   Morbid obesity (HCC) 10/13/2013   Ureteric stone 06/08/2013   Calculus of kidney 01/21/2013    PCP: Jon Eva, MD  REFERRING PROVIDER: Lynwood Rias, MD  REFERRING DIAG: Post-op Carpal Tunnel and ulnar nerve release surgery   THERAPY DIAG:  Right hand weakness  Decreased range of motion of right wrist  Pain in right wrist  Rationale for Evaluation and Treatment: Rehabilitation  ONSET DATE: 03/11/2024  SUBJECTIVE:  SUBJECTIVE STATEMENT: Pt reports undergoing surgery for her right arm on 03/11/24. Carpal tunnel release was performed at her wrist, and an ulnar release was performed at her elbow. Pt reports she has been able to use her hand some but reports pain and weakness with ADLs such as opening food packaging, preparing meals, showering. Pt has been following surgical site precautions and will be undergoing another surgery for her left foot in roughly 2 weeks. 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   PAIN:  Are you having pain? Yes Patient reports no pain at rest, but will occasionally rest her elbow on the incision site accidentally which results in a sudden onset of pain (5/10 NPS), or with gripping objects with her hand (3-4/10 NPS) The pain will subside within a few minutes.   PRECAUTIONS: None  RED FLAGS: None   WEIGHT BEARING RESTRICTIONS: No  FALLS:  Has patient fallen in last 6 months? No   OCCUPATION: Retired   PLOF: Independent  PATIENT GOALS: To be able to return to ADLs without pain and hobbies (jigsaw puzzles) without pain or weakness   OBJECTIVE:  Note: Objective measures were completed at Evaluation unless otherwise noted.  DIAGNOSTIC FINDINGS:  Pt lacks full ROM in wrist flexion and extension, and MCP/PIP flexion and extension, mild stretching sensation when passively moving past active ROM   PATIENT SURVEYS :  Quick-DASH: 68  SENSATION: Patient reports some pain with light touch on her right pointer finger, secondary to the carpal tunnel release surgery. (1/10 NPS) Pain subsides immediately    UPPER EXTREMITY ROM:   Active ROM Right eval Left eval  Shoulder flexion    Shoulder extension    Shoulder abduction    Shoulder adduction    Shoulder internal rotation    Shoulder external rotation    Elbow flexion 135 140  Elbow extension [facility] WFL  Wrist flexion 37 with pain 50  Wrist extension 53 with pain 72  Wrist ulnar deviation    Wrist radial deviation    Wrist pronation 90 90  Wrist supination 83 85  (Blank rows = not tested)  UPPER EXTREMITY MMT: Deferred MMT due to acute post-op status  MMT Right eval Left eval  Shoulder flexion    Shoulder extension    Shoulder abduction    Shoulder adduction    Shoulder internal rotation    Shoulder external rotation    Middle trapezius    Lower trapezius    Elbow flexion    Elbow extension    Wrist flexion    Wrist extension    Wrist ulnar deviation    Wrist radial  deviation    Wrist pronation    Wrist supination    Grip strength (lbs)    (Blank rows = not tested)   PALPATION: Incision sites in wrist and elbow are tender to palpation; surgical sites are healing well  TREATMENT DATE: 03/23/2024  PATIENT EDUCATION: Education details: Pt educated on performing active and passive motion in all planes of movement of her wrist and elbow. Pt instructed to keep incision sites clean. Pt is aware of ROM and strength progression strategies moving forward as her surgical sites begin to heal.  Person educated: Patient Education method: Explanation Education comprehension: verbalized understanding  HOME EXERCISE PROGRAM: Gentle AROM/PROM of wrist flexion/extension, radial and ulnar deviation, pronation and supination, elbow flexion and extension  Repeat each of the above 2x daily 10x each movement Access Code: [email or web address] URL: [email or web address] Date: 03/23/2024 Prepared by: Vernell Reges  Exercises - Wrist Flexion AROM  - 2 x daily - 7 x weekly - 3 sets - 10 reps - Wrist AROM Flexion Extension  - 2 x daily - 7 x weekly - 2 sets - 10 reps - Seated Forearm Pronation and Supination AROM  - 2 x daily - 7 x weekly - 2 sets - 10 reps - Wrist AROM Radial Ulnar Deviation  - 2 x daily - 7 x weekly - 2 sets - 10 reps - Seated Finger Composite Flexion Extension  - 2 x daily - 7 x weekly - 2 sets - 10 reps  ASSESSMENT:  CLINICAL IMPRESSION: Patient is a 59 y.o. female who was seen today for physical therapy evaluation and treatment for post-operative carpal tunnel release and ulnar nerve release. Pt is a good candidate for skilled therapeutic intervention in order to restore full ROM and previous level of strength to facilitate completion of ADLs and community involvement. Pt will benefit from progression of ROM and strengthening therex in  order to maximize rehab potential and shorten timeframe for return to PLOF.    OBJECTIVE IMPAIRMENTS: decreased ROM, decreased strength, and impaired sensation.   ACTIVITY LIMITATIONS: carrying, lifting, bathing, toileting, and dressing  PARTICIPATION LIMITATIONS: cleaning, laundry, driving, and yard work  [employer] POTENTIAL: Good  CLINICAL DECISION MAKING: Stable/uncomplicated  EVALUATION COMPLEXITY: Low  GOALS: Goals reviewed with patient? Yes  SHORT TERM GOALS: Target date: 03/06/2024  Pt will be competent in the regular completion of prescribed HEP in order to maximize early ROM and strength gains.  Baseline: HEP given today (03/23/2024) Goal status: INITIAL  2.  Pt will have a reduced level of pain (1-2/10 NPS) when performing active ROM in all planes for wrist and elbow in order to facilitate therex progression.  Baseline: 3-4/10 NPS current (03/23/2024) Goal status: INITIAL   LONG TERM GOALS: Target date: 05/17/2024  Pt will have full ROM restored to contralateral side in order to facilitate ADL completion and a return to hobbies requiring fine motor skills (jigsaw puzzles). Baseline: Lacking right UE ROM in wrist flexion/extension/elbow flexion (see above for objective measures) Goal status: INITIAL  2.  Pt will have 0/10 pain on the NPS when completing ADLs and hobbies in order to facilitate for participation in previous lifestyle activities.  Baseline: 3-4/10 NPS currently when performing active ROM at end-range  Goal status: INITIAL     PLAN: PT FREQUENCY: 2x/week  PT DURATION: 8 weeks  PLANNED INTERVENTIONS: 97110-Therapeutic exercises, 97530- Therapeutic activity, [id number]- Neuromuscular re-education, 97535- Self Care, [id number]- Manual therapy, and Patient/Family education  PLAN FOR NEXT SESSION: Discuss HEP completion, progress isometric strengthening therex and perform PROM and gentle manual therapy for pain control. May assess wrist and elbow PAM next week  depending on surgical site inflammation and healing process. Update HEP on treatment note and add isometrics.   Juliene Levine, Student-PT 03/23/2024, 4:24 PM  Vernell Reges, PT, DPT, OCS   Student physical therapist under direct supervision of licensed physical therapist during the entirety of the session.  I have personally read, edited and approve of the note as written.  Vernell Reges, PT, DPT, OCS  "

## 2024-03-25 ENCOUNTER — Ambulatory Visit

## 2024-03-25 ENCOUNTER — Other Ambulatory Visit: Payer: Self-pay

## 2024-03-25 ENCOUNTER — Telehealth: Payer: Self-pay | Admitting: Family Medicine

## 2024-03-25 DIAGNOSIS — G8929 Other chronic pain: Secondary | ICD-10-CM

## 2024-03-25 NOTE — Telephone Encounter (Signed)
 LOV 03/01/24 NOV 08/30/24 LRF 09/17/23 [id number]

## 2024-03-25 NOTE — Telephone Encounter (Signed)
 Med sent to pcp for review

## 2024-03-25 NOTE — Telephone Encounter (Signed)
 [facility] faxed refill request for the following medications:  meloxicam (MOBIC) 15 MG tablet   Please advise.

## 2024-03-28 ENCOUNTER — Ambulatory Visit

## 2024-03-29 ENCOUNTER — Other Ambulatory Visit: Payer: Self-pay | Admitting: Podiatry

## 2024-03-29 MED ORDER — MELOXICAM 15 MG PO TABS: ORAL_TABLET | ORAL | 1 refills | Status: AC

## 2024-03-30 ENCOUNTER — Ambulatory Visit

## 2024-03-30 DIAGNOSIS — R29898 Other symptoms and signs involving the musculoskeletal system: Secondary | ICD-10-CM | POA: Diagnosis not present

## 2024-03-30 DIAGNOSIS — M25531 Pain in right wrist: Secondary | ICD-10-CM

## 2024-03-30 DIAGNOSIS — M25631 Stiffness of right wrist, not elsewhere classified: Secondary | ICD-10-CM

## 2024-03-30 NOTE — Therapy (Addendum)
 " OUTPATIENT PHYSICAL THERAPY UPPER EXTREMITY EVALUATION   Patient Name: Bonnie Garcia MRN: [id number] DOB:28-Jul-1965, 59 y.o., female Today's Date: 03/30/2024  END OF SESSION:  PT End of Session - 03/30/24 1409     Visit Number 2    Number of Visits 17    Date for Recertification  05/13/24    PT Start Time 1421    PT Stop Time 1453    PT Time Calculation (min) 32 min    Activity Tolerance Patient tolerated treatment well    Behavior During Therapy [facility] for tasks assessed/performed          Past Medical History:  Diagnosis Date   ADHD (attention deficit hyperactivity disorder)    Anxiety    Arthritis 2020   Depression    Diabetes mellitus without complication (HCC)    Hypertension    Kidney stones    Thyroid  nodule    Past Surgical History:  Procedure Laterality Date   APPENDECTOMY     BREAST BIOPSY Right 11/24/2023   MM RT BREAST BX W LOC DEV 1ST LESION IMAGE BX SPEC STEREO GUIDE 11/24/2023 [facility]   CHOLECYSTECTOMY     COLONOSCOPY WITH PROPOFOL  N/A 07/23/2015   Procedure: COLONOSCOPY WITH PROPOFOL ;  Surgeon: Gladis RAYMOND Mariner, MD;  Location: [facility] ENDOSCOPY;  Service: Endoscopy;  Laterality: N/A;   ECTOPIC PREGNANCY SURGERY     ENDOMETRIAL ABLATION     ESOPHAGOGASTRODUODENOSCOPY (EGD) WITH PROPOFOL  N/A 07/23/2015   Procedure: ESOPHAGOGASTRODUODENOSCOPY (EGD) WITH PROPOFOL ;  Surgeon: Gladis RAYMOND Mariner, MD;  Location: [facility] ENDOSCOPY;  Service: Endoscopy;  Laterality: N/A;   TUBAL LIGATION     Patient Active Problem List   Diagnosis Date Noted   Other chest pain 12/02/2022   Elevated hemoglobin 05/08/2022   Breast cancer screening by mammogram 02/06/2022   Vaginal yeast infection 01/15/2022   Gastroesophageal reflux disease with esophagitis without hemorrhage 01/15/2022   Blood blister 11/07/2021   Vasomotor symptoms due to menopause 11/07/2021   Acute pain of right shoulder 06/17/2021   Adhesive capsulitis of right shoulder 06/17/2021   Avitaminosis D  01/03/2021   Left hip pain 01/03/2021   Chronic bilateral low back pain without sciatica 06/23/2019   Myalgia 12/17/2017   Thyroid  nodule 10/21/2017   Hyperlipidemia associated with type 2 diabetes mellitus (HCC) 10/21/2017   PCOS (polycystic ovarian syndrome) 10/21/2017   Hirsutism 10/21/2017   Dysuria 10/21/2017   Severe recurrent major depression without psychotic features (HCC) 09/11/2017   ADHD (attention deficit hyperactivity disorder) 09/11/2017   Diabetes mellitus (HCC) 09/11/2017   Hypertension associated with diabetes (HCC) 09/11/2017   B12 deficiency 10/13/2013   Morbid obesity (HCC) 10/13/2013   Ureteric stone 06/08/2013   Calculus of kidney 01/21/2013    PCP: Jon Eva, MD  REFERRING PROVIDER: Lynwood Rias, MD  REFERRING DIAG: Post-op Carpal Tunnel and ulnar nerve release surgery   THERAPY DIAG:  Right hand weakness  Decreased range of motion of right wrist  Pain in right wrist  Rationale for Evaluation and Treatment: Rehabilitation  ONSET DATE: 03/11/2024  SUBJECTIVE:  SUBJECTIVE STATEMENT: Pt reports undergoing surgery for her right arm on 03/11/24. Carpal tunnel release was performed at her wrist, and an ulnar release was performed at her elbow. Pt reports she has been able to use her hand some but reports pain and weakness with ADLs such as opening food packaging, preparing meals, showering. Pt has been following surgical site precautions and will be undergoing another surgery for her left foot in roughly 2 weeks. 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   PAIN:  Are you having pain? Yes Patient reports no pain at rest, but will occasionally rest her elbow on the incision site accidentally which results in a sudden onset of pain (5/10 NPS), or with gripping objects with her hand (3-4/10 NPS) The pain will subside within a few minutes.   PRECAUTIONS: None  RED FLAGS: None   WEIGHT BEARING RESTRICTIONS: No  FALLS:  Has patient fallen in last 6 months? No   OCCUPATION: Retired   PLOF: Independent  PATIENT GOALS: To be able to return to ADLs without pain and hobbies (jigsaw puzzles) without pain or weakness   OBJECTIVE:  Note: Objective measures were completed at Evaluation unless otherwise noted.  DIAGNOSTIC FINDINGS:  Pt lacks full ROM in wrist flexion and extension, and MCP/PIP flexion and extension, mild stretching sensation when passively moving past active ROM   PATIENT SURVEYS :  Quick-DASH: 68  SENSATION: Patient reports some pain with light touch on her right pointer finger, secondary to the carpal tunnel release surgery. (1/10 NPS) Pain subsides immediately    UPPER EXTREMITY ROM:   Active ROM Right eval Left eval  Shoulder flexion    Shoulder extension    Shoulder abduction    Shoulder adduction    Shoulder internal rotation    Shoulder external rotation    Elbow flexion 135 140  Elbow extension [facility] WFL  Wrist flexion 37 with pain 50  Wrist extension 53 with pain 72  Wrist ulnar deviation    Wrist radial deviation    Wrist pronation 90 90  Wrist supination 83 85  (Blank rows = not tested)  UPPER EXTREMITY MMT: Deferred MMT due to acute post-op status  MMT Right eval Left eval  Shoulder flexion    Shoulder extension    Shoulder abduction    Shoulder adduction    Shoulder internal rotation    Shoulder external rotation    Middle trapezius    Lower trapezius    Elbow flexion    Elbow extension    Wrist flexion    Wrist extension    Wrist ulnar deviation    Wrist radial  deviation    Wrist pronation    Wrist supination    Grip strength (lbs)    (Blank rows = not tested)   PALPATION: Incision sites in wrist and elbow are tender to palpation; surgical sites are healing well  TREATMENT DATE: 03/30/2024  Subjective: Pt states that her incision site on her right hand has opened up a little bit after her stitches were removed last week. Little to no pain in both the right hand and elbow. Pt has been practicing her HEP regularly and will go back for a follow up appointment on February 9th.    Objective:  Therex.:  Wrist/hand nerve glides 10 reps, HEP instruction  Thera-putty thumb opposition against each digit, 10sec hold x 5 each digit with HEP instruction Thera-putty key-pinch grip, 10sec hold x 3 with HEP instruction  Thera-putty PIP/DIP flexion, 10sec hold x 10 with HEP instruction   Patient Education: Pt educated to keep incision site clean and dry, avoid epsom salt soaks and keep incision covered. Educated on nerve gliding and thera-putty use for optimizing healing process.   PATIENT EDUCATION: Education details: Pt educated on performing active and passive motion in all planes of movement of her wrist and elbow. Pt instructed to keep incision sites clean. Pt is aware of ROM and strength progression strategies moving forward as her surgical sites begin to heal.  Person educated: Patient Education method: Explanation Education comprehension: verbalized understanding  HOME EXERCISE PROGRAM: Access Code: [email or web address] URL: [email or web address] Date: 03/30/2024 Prepared by: Vernell Reges  Exercises - Wrist Tendon Gliding  - 5 x daily - 7 x weekly - 1 sets - 10 reps - Finger Key Grip with Putty  - 1 x daily - 7 x weekly - 3 sets - 10 reps - Seated Finger Composite Flexion with Putty  - 1 x daily - 7 x weekly - 3 sets - 10 reps -  Seated Finger Tip Pinch with Putty  - 1 x daily - 7 x weekly - 3 sets - 10 reps  ASSESSMENT:  CLINICAL IMPRESSION: Patient is a 59 y.o. female who was seen today for physical therapy treatment for post-operative carpal tunnel release and ulnar nerve release. Pt is a good candidate for skilled therapeutic intervention in order to restore full ROM and previous level of strength to facilitate completion of ADLs and community involvement. Pt will benefit from progression of ROM and strengthening therex in order to maximize rehab potential and shorten timeframe for return to PLOF. Pt has regained most of her motion but lacks some intrinsic hand strength. Progress with thera-putty HEP prescription and monitor incision site. Pt has been educated on incision care and is compliant with her HEP.    OBJECTIVE IMPAIRMENTS: decreased ROM, decreased strength, and impaired sensation.   ACTIVITY LIMITATIONS: carrying, lifting, bathing, toileting, and dressing  PARTICIPATION LIMITATIONS: cleaning, laundry, driving, and yard work  [employer] POTENTIAL: Good  CLINICAL DECISION MAKING: Stable/uncomplicated  EVALUATION COMPLEXITY: Low  GOALS: Goals reviewed with patient? Yes  SHORT TERM GOALS: Target date: 03/06/2024  Pt will be competent in the regular completion of prescribed HEP in order to maximize early ROM and strength gains.  Baseline: HEP given today (03/23/2024) Goal status: INITIAL  2.  Pt will have a reduced level of pain (1-2/10 NPS) when performing active ROM in all planes for wrist and elbow in order to facilitate therex progression.  Baseline: 3-4/10 NPS current (03/23/2024) Goal status: INITIAL   LONG TERM GOALS: Target date: 05/17/2024  Pt will have full ROM restored to contralateral side in order to facilitate ADL completion and a return to hobbies requiring fine motor skills (jigsaw puzzles). Baseline: Lacking right UE ROM in wrist flexion/extension/elbow flexion (see above for objective  measures) Goal status: INITIAL  2.  Pt will have 0/10 pain on the NPS when completing ADLs and hobbies in order to facilitate for participation in previous lifestyle activities.  Baseline: 3-4/10 NPS currently when performing active ROM at end-range  Goal status: INITIAL     PLAN: PT FREQUENCY: 2x/week  PT DURATION: 8 weeks  PLANNED INTERVENTIONS: 97110-Therapeutic exercises, 97530- Therapeutic activity, [id number]- Neuromuscular re-education, 97535- Self Care, [id number]- Manual therapy, and Patient/Family education  PLAN FOR NEXT SESSION: Discuss HEP completion, progress isometric strengthening therex and perform PROM and gentle manual therapy for pain control. May assess wrist and elbow PAM next week depending on surgical site inflammation and healing process. Update HEP on treatment note and add isometrics. Test grip strength if incision looks better.    Juliene Levine, Student-PT 03/30/2024, 3:11 PM  Vernell Reges, PT, DPT, OCS   Student physical therapist under direct supervision of licensed physical therapist during the entirety of the session.  I have personally read, edited and approve of the note as written.  Vernell Reges, PT, DPT, OCS  "

## 2024-04-04 ENCOUNTER — Ambulatory Visit

## 2024-04-06 ENCOUNTER — Ambulatory Visit

## 2024-04-08 ENCOUNTER — Inpatient Hospital Stay: Admission: RE | Admit: 2024-04-08 | Source: Ambulatory Visit

## 2024-04-08 ENCOUNTER — Other Ambulatory Visit: Payer: Self-pay | Admitting: Podiatry

## 2024-04-08 NOTE — Patient Instructions (Addendum)
 Your procedure is scheduled on: FRIDAY 04/15/24 Report to the Registration Desk on the 1st floor of [facility]. To find out your arrival time, please call [phone] between 1PM - 3PM on: THURSDAY 04/14/24 If your arrival time is 6:00 am, do not arrive before that time as the [facility] entrance doors do not open until 6:00 am.  REMEMBER: Instructions that are not followed completely may result in serious medical risk, up to and including death; or upon the discretion of your surgeon and anesthesiologist your surgery may need to be rescheduled.  Do not eat food after midnight the night before surgery.  No gum chewing or hard candies.  You may however, drink CLEAR liquids up to 2 hours before you are scheduled to arrive for your surgery. Do not drink anything within 2 hours of your scheduled arrival time.  Clear liquids include: - water  - apple juice without pulp - gatorade (not RED colors) - black coffee or tea (Do NOT add milk or creamers to the coffee or tea) Do NOT drink anything that is not on this list.  In addition, your doctor has ordered for you to drink the provided:  Gatorade G2 Drinking this carbohydrate drink up to two hours before surgery helps to reduce insulin  resistance and improve patient outcomes. Please complete drinking 2 hours before scheduled arrival time.  One week prior to surgery: Stop Anti-inflammatories (NSAIDS) such as meloxicam  (MOBIC ) Advil, Aleve, Ibuprofen, Motrin, Naproxen, Naprosyn and Aspirin  based products such as Excedrin, Goody's Powder, BC Powder. Stop ANY OVER THE COUNTER supplements until after surgery.  STOP tirzepatide  (MOUNJARO ) 7 days before surgery. Last dose: 04/07/24  STOP Dapagliflozin  Pro-metFORMIN  ER (XIGDUO  XR) 3 days before surgery. Last dose: 04/11/24  You may however, continue to take Tylenol  if needed for pain up until the day of surgery.  Continue taking all of your other prescription medications up until the day of  surgery.  ON THE DAY OF SURGERY ONLY TAKE THESE MEDICATIONS WITH SIPS OF WATER:  gabapentin  (NEURONTIN )  rosuvastatin  (CRESTOR )  LORazepam  (ATIVAN )  citalopram  (CELEXA )   Use inhalers on the day of surgery and bring to the hospital.  No Alcohol for 24 hours before or after surgery.  No Smoking including e-cigarettes for 24 hours before surgery.  No chewable tobacco products for at least 6 hours before surgery.  No nicotine patches on the day of surgery.  Do not use any recreational drugs for at least a week (preferably 2 weeks) before your surgery.  Please be advised that the combination of cocaine and anesthesia may have negative outcomes, up to and including death. If you test positive for cocaine, your surgery will be cancelled.  On the morning of surgery brush your teeth with toothpaste and water, you may rinse your mouth with mouthwash if you wish. Do not swallow any toothpaste or mouthwash.  Use CHG Soap or wipes as directed on instruction sheet.  Do not wear jewelry, make-up, hairpins, clips or nail polish.  For welded (permanent) jewelry: bracelets, anklets, waist bands, etc.  Please have this removed prior to surgery.  If it is not removed, there is a chance that hospital personnel will need to cut it off on the day of surgery.  Do not wear lotions, powders, or perfumes.   Do not shave body hair from the neck down 48 hours before surgery.  Contact lenses, hearing aids and dentures may not be worn into surgery.  Do not bring valuables to the hospital.  [facility] is not responsible for any missing/lost belongings or valuables.   Bring your C-PAP to the hospital in case you may have to spend the night.   Notify your doctor if there is any change in your medical condition (cold, fever, infection).  Wear comfortable clothing (specific to your surgery type) to the hospital.  After surgery, you can help prevent lung complications by doing breathing exercises.  Take  deep breaths and cough every 1-2 hours. Your doctor may order a device called an Incentive Spirometer to help you take deep breaths. When coughing or sneezing, hold a pillow firmly against your incision with both hands. This is called splinting. Doing this helps protect your incision. It also decreases belly discomfort.  If you are being discharged the day of surgery, you will not be allowed to drive home. You will need a responsible individual to drive you home and stay with you for 24 hours after surgery.   If you are taking public transportation, you will need to have a responsible individual with you.  Please call the Pre-admissions Testing Dept. at [phone] if you have any questions about these instructions.  Surgery Visitation Policy:  Patients having surgery or a procedure may have two visitors.  Children under the age of 15 must have an adult with them who is not the patient.  Merchandiser, Retail to address health-related social needs:  [email or web address]                                                                                                             Preparing for Surgery with CHLORHEXIDINE GLUCONATE (CHG) Soap  Chlorhexidine Gluconate (CHG) Soap  o An antiseptic cleaner that kills germs and bonds with the skin to continue killing germs even after washing  o Used for showering the night before surgery and morning of surgery  Before surgery, you can play an important role by reducing the number of germs on your skin.  CHG (Chlorhexidine gluconate) soap is an antiseptic cleanser which kills germs and bonds with the skin to continue killing germs even after washing.  Please do not use if you have an allergy to CHG or antibacterial soaps. If your skin becomes reddened/irritated stop using the CHG.  1. Shower the NIGHT BEFORE SURGERY with CHG soap.  2. If you choose to wash your hair, wash your hair first as usual with your normal  shampoo.  3. After shampooing, rinse your hair and body thoroughly to remove the shampoo.  4. Use CHG as you would any other liquid soap. You can apply CHG directly to the skin and wash gently with a clean washcloth.  5. Apply the CHG soap to your body only from the neck down. Do not use on open wounds or open sores. Avoid contact with your eyes, ears, mouth, and genitals (private parts). Wash face and genitals (private parts) with your normal soap.  6. Wash thoroughly, paying special attention to the area where your surgery will be performed.  7. Thoroughly rinse your body with warm  water.  8. Do not shower/wash with your normal soap after using and rinsing off the CHG soap.  9. Do not use lotions, oils, etc., after showering with CHG.  10. Pat yourself dry with a clean towel.  11. Wear clean pajamas to bed the night before surgery.  12. Place clean sheets on your bed the night of your shower and do not sleep with pets.  13. Do not apply any deodorants/lotions/powders.  14. Please wear clean clothes to the hospital.  15. Remember to brush your teeth with your regular toothpaste.  How to Use an Incentive Spirometer  An incentive spirometer is a tool that measures how well you are filling your lungs with each breath. Learning to take long, deep breaths using this tool can help you keep your lungs clear and active. This may help to reverse or lessen your chance of developing breathing (pulmonary) problems, especially infection. You may be asked to use a spirometer: After a surgery. If you have a lung problem or a history of smoking. After a long period of time when you have been unable to move or be active. If the spirometer includes an indicator to show the highest number that you have reached, your health care provider or respiratory therapist will help you set a goal. Keep a log of your progress as told by your health care provider. What are the risks? Breathing too quickly may  cause dizziness or cause you to pass out. Take your time so you do not get dizzy or light-headed. If you are in pain, you may need to take pain medicine before doing incentive spirometry. It is harder to take a deep breath if you are having pain. How to use your incentive spirometer  Sit up on the edge of your bed or on a chair. Hold the incentive spirometer so that it is in an upright position. Before you use the spirometer, breathe out normally. Place the mouthpiece in your mouth. Make sure your lips are closed tightly around it. Breathe in slowly and as deeply as you can through your mouth, causing the piston or the ball to rise toward the top of the chamber. Hold your breath for 3-5 seconds, or for as long as possible. If the spirometer includes a coach indicator, use this to guide you in breathing. Slow down your breathing if the indicator goes above the marked areas. Remove the mouthpiece from your mouth and breathe out normally. The piston or ball will return to the bottom of the chamber. Rest for a few seconds, then repeat the steps 10 or more times. Take your time and take a few normal breaths between deep breaths so that you do not get dizzy or light-headed. Do this every 1-2 hours when you are awake. If the spirometer includes a goal marker to show the highest number you have reached (best effort), use this as a goal to work toward during each repetition. After each set of 10 deep breaths, cough a few times. This will help to make sure that your lungs are clear. If you have an incision on your chest or abdomen from surgery, place a pillow or a rolled-up towel firmly against the incision when you cough. This can help to reduce pain while taking deep breaths and coughing. General tips When you are able to get out of bed: Walk around often. Continue to take deep breaths and cough in order to clear your lungs. Keep using the incentive spirometer until your health care provider  says it is  okay to stop using it. If you have been in the hospital, you may be told to keep using the spirometer at home. Contact a health care provider if: You are having difficulty using the spirometer. You have trouble using the spirometer as often as instructed. Your pain medicine is not giving enough relief for you to use the spirometer as told. You have a fever. Get help right away if: You develop shortness of breath. You develop a cough with bloody mucus from the lungs. You have fluid or blood coming from an incision site after you cough. Summary An incentive spirometer is a tool that can help you learn to take long, deep breaths to keep your lungs clear and active. You may be asked to use a spirometer after a surgery, if you have a lung problem or a history of smoking, or if you have been inactive for a long period of time. Use your incentive spirometer as instructed every 1-2 hours while you are awake. If you have an incision on your chest or abdomen, place a pillow or a rolled-up towel firmly against your incision when you cough. This will help to reduce pain. Get help right away if you have shortness of breath, you cough up bloody mucus, or blood comes from your incision when you cough. This information is not intended to replace advice given to you by your health care provider. Make sure you discuss any questions you have with your health care provider. Document Revised: 12/26/2022 Document Reviewed: 12/26/2022 Elsevier Patient Education  2024 [employer].

## 2024-04-11 ENCOUNTER — Ambulatory Visit

## 2024-04-13 ENCOUNTER — Ambulatory Visit

## 2024-04-15 ENCOUNTER — Encounter: Payer: Self-pay | Admitting: Urgent Care

## 2024-04-15 ENCOUNTER — Ambulatory Visit: Admission: RE | Admit: 2024-04-15 | Source: Home / Self Care | Admitting: Podiatry

## 2024-04-15 ENCOUNTER — Encounter: Admission: RE | Payer: Self-pay | Source: Home / Self Care

## 2024-04-15 DIAGNOSIS — M19072 Primary osteoarthritis, left ankle and foot: Secondary | ICD-10-CM

## 2024-04-15 DIAGNOSIS — M150 Primary generalized (osteo)arthritis: Secondary | ICD-10-CM

## 2024-04-15 DIAGNOSIS — Z01818 Encounter for other preprocedural examination: Secondary | ICD-10-CM

## 2024-04-18 ENCOUNTER — Ambulatory Visit

## 2024-04-20 ENCOUNTER — Ambulatory Visit

## 2024-05-30 ENCOUNTER — Ambulatory Visit: Admitting: Family Medicine
